# Patient Record
Sex: Male | Born: 1980 | Race: Black or African American | Hispanic: No | Marital: Single | Smoking: Current some day smoker
Health system: Southern US, Community
[De-identification: ages and names within clinical notes are randomized; demographics above are authoritative.]

## PROBLEM LIST (undated history)

## (undated) DIAGNOSIS — F329 Major depressive disorder, single episode, unspecified: Secondary | ICD-10-CM

## (undated) DIAGNOSIS — F319 Bipolar disorder, unspecified: Secondary | ICD-10-CM

## (undated) DIAGNOSIS — J45909 Unspecified asthma, uncomplicated: Secondary | ICD-10-CM

## (undated) DIAGNOSIS — F32A Depression, unspecified: Secondary | ICD-10-CM

## (undated) DIAGNOSIS — J449 Chronic obstructive pulmonary disease, unspecified: Secondary | ICD-10-CM

## (undated) DIAGNOSIS — F209 Schizophrenia, unspecified: Secondary | ICD-10-CM

## (undated) DIAGNOSIS — L709 Acne, unspecified: Secondary | ICD-10-CM

## (undated) HISTORY — DX: Acne, unspecified: L70.9

---

## 2013-07-14 ENCOUNTER — Encounter: Payer: Self-pay | Admitting: Internal Medicine

## 2013-07-14 ENCOUNTER — Ambulatory Visit (INDEPENDENT_AMBULATORY_CARE_PROVIDER_SITE_OTHER): Payer: No Typology Code available for payment source | Admitting: Internal Medicine

## 2013-07-14 VITALS — BP 113/70 | HR 59 | Ht 68.0 in | Wt 125.0 lb

## 2013-07-14 DIAGNOSIS — S43402A Unspecified sprain of left shoulder joint, initial encounter: Secondary | ICD-10-CM

## 2013-07-14 DIAGNOSIS — IMO0002 Reserved for concepts with insufficient information to code with codable children: Secondary | ICD-10-CM

## 2013-07-14 DIAGNOSIS — S6390XA Sprain of unspecified part of unspecified wrist and hand, initial encounter: Secondary | ICD-10-CM

## 2013-07-14 DIAGNOSIS — S6392XA Sprain of unspecified part of left wrist and hand, initial encounter: Secondary | ICD-10-CM

## 2013-07-14 DIAGNOSIS — S46909A Unspecified injury of unspecified muscle, fascia and tendon at shoulder and upper arm level, unspecified arm, initial encounter: Secondary | ICD-10-CM

## 2013-07-14 MED ORDER — IBUPROFEN 600 MG PO TABS
600.0000 mg | ORAL_TABLET | Freq: Three times a day (TID) | ORAL | Status: AC | PRN
Start: 2013-07-14 — End: 2013-07-24

## 2013-07-14 MED ORDER — CYCLOBENZAPRINE HCL 10 MG PO TABS
10.0000 mg | ORAL_TABLET | Freq: Every evening | ORAL | Status: AC | PRN
Start: 2013-07-14 — End: ?

## 2013-07-14 NOTE — Patient Instructions (Addendum)
Use heat locally  Do not raise left arm above shoulder level  Do not lift, pull or push  >35lbs   Take medications as directed.  Follow up in 10-14 days.         Sprain, Hand  A sprain is a stretching or tearing of the ligaments that hold a joint together. There are no broken bones. Sprains take from three to six weeks to heal.  A sprained hand may be treated with a splint or elastic wrap for support.  Home care  The following guidelines will help you care for your injury at home:  1) Keep your ARM elevated to reduce pain and swelling. This is most important during the first 48 hours.  2) Apply an ice pack (ice cubes in a plastic bag, wrapped in a towel) over the injured area for 20 minutes every 1-2 hours the first day. You should continue with ice packs 3-4 times a day for the next two days. Continue the use of ice packs for relief of pain and swelling as needed.  3) You may use acetaminophen or ibuprofen to control pain, unless another pain medicine was prescribed. If you have chronic liver or kidney disease or ever had a stomach ulcer or GI bleeding, talk with your doctor before using these medicines.  4) If you were given a splint or elastic wrap, wear it until your pain improves.  Follow-up care  Follow up with your doctor as directed.  Any X-rays you had today don't show any broken bones, breaks, or fractures. Sometimes fractures don't show up on the first X-ray. Bruises and sprains can sometimes hurt as much as a fracture. These injuries can take time to heal completely. If your symptoms don't improve or they get worse, talk with your doctor. You may need a repeat X-ray.  When to seek medical care  Get prompt medical attention if any of the following occur:   Pain or swelling increases   Fingers or hand becomes cold, blue, numb, or tingly   [address], [address], [place], [place] [address]. All rights reserved. This information is not intended as a substitute for professional medical  care. Always follow your healthcare professional's instructions.      Shoulder Sprain  A sprain is a stretching or tearing of the ligaments that hold a joint together. A sprain may take up to six weeks to fully heal, depending on how severe it is. Moderate to severe shoulder sprains are treated with a sling or shoulder immobilizer. Minor sprains can be treated without any special support.  Home care  The following guidelines will help you care for your injury at home:   If a sling was provided, leave it in place for the time advised by your doctor. If you are unsure how long to wear it, ask for advice. If the sling becomes loose, adjust it so that your forearm is level with the ground and the shoulder feels well supported.   Apply an ice pack (ice cubes in a plastic bag, wrapped in a thin towel) over the injured area for 20 minutes every 1-2 hours the first day. Continue with ice packs 3-4 times a day for the next two days, then as needed for the relief of pain and swelling.   You may use acetaminophen or ibuprofen to control pain, unless another pain medicine was prescribed.If you have chronic liver or kidney disease or ever had a stomach ulcer or GI bleeding, talk with your  doctor before using these medicines.   Shoulder joints become stiff if left in a sling for too long. Range of motion exercises should usually be started within the first ten days after injury. Consult your doctor on what type of exercises to do and how soon to start.  Follow-up care  Follow up with your doctor as directed.  Any X-rays you had today don't show any broken bones, breaks, or fractures. Sometimes fractures don't show up on the first X-ray. Bruises and sprains can sometimes hurt as much as a fracture. These injuries can take time to heal completely. If your symptoms don't improve or they get worse, talk with your doctor. You may need a repeat X-ray.  When to seek medical care  Get prompt medical attention if any of the following  occur:   Increasing shoulder pain or arm swelling   Fingers become cold, blue, numb, or tingly   Large amount of bruising of the shoulder or upper arm   [address], [address] [place], [place] [address]. All rights reserved. This information is not intended as a substitute for professional medical care. Always follow your healthcare professional's instructions.

## 2013-07-14 NOTE — Progress Notes (Signed)
Subjective:       Patient ID: Adam Deleon is a 33 y.o. male.  Chief Complaint   Patient presents with   . Shoulder Pain     Patient reports onset of left shoulder pain and "spasms" a couple hours after pushing a very heavy cart on 07/11/13.  He also noted onset of left hand pain/weakness.  He has not been to emergenct department or had x-rays.   . Hand Pain       Shoulder Pain   The pain is present in the left shoulder, left arm and left hand. This is a new problem. The current episode started in the past 7 days (On 07/11/13 while moving heavy food laden carts into his truck was holding the carts from rolling overstreched his left shoulder and hand. .Later developed spasms over the upper arm and shoulder and pain on left hand even with lifting a bowl.+). There has been no history of extremity trauma. The problem occurs intermittently. The problem has been unchanged. The quality of the pain is described as dull. Pain scale: pain is 8/10 on pushing left hand or lifting with left hand and on moving rleft hand above shoulder level. The pain is moderate. Associated symptoms include stiffness. Pertinent negatives include no joint locking or joint swelling. The treatment provided no relief.            Review of Systems   Constitutional: Negative for activity change and appetite change.   Respiratory: Negative for chest tightness.    Musculoskeletal: Positive for stiffness.        Pain on lifting with left hand. Pain on left shoulder and upper arm with overhead lifting of left arm .   Neurological: Negative for dizziness and weakness.           Objective:    Physical Exam   Constitutional: He appears well-developed and well-nourished. No distress.   HENT:   Head: Normocephalic and atraumatic.   Neck: Normal range of motion. Neck supple.   Musculoskeletal:        Left shoulder and upper arm- no swelling or deformity or tenderness with decreased ROM unable to circumduct, miidl tenderness over left deltoid on pressuret. Left  hand - no swelling or deformity but has pain over left 5 th metacarpal on pressure. Neurovascular intact. Grip strength normal.           Assessment:       Left shoulder and upper arm sprain.  Left hand sprain.      Plan:           Use heat locally  Do not raise left arm above shoulder level  Do not lift, pull or push  >35lbs   Take medications as directed.  Follow up in 10-14 days.

## 2013-07-26 VITALS — BP 111/56 | HR 68

## 2013-07-26 DIAGNOSIS — M25512 Pain in left shoulder: Secondary | ICD-10-CM

## 2013-07-26 NOTE — Progress Notes (Signed)
Subjective:       Patient ID: Adam Deleon is a 33 y.o. male.    HPI    Patient stated that his Left shoulder strain has not improved.  He still feels pain when raising his left arm over his shoulder.  Patient stated that he did not take his medications because he believes that they do not do anything other than numbing the pain.        Review of Systems   Constitutional: Negative.    HENT: Negative.    Eyes: Negative.    Respiratory: Negative.    Cardiovascular: Negative.    Gastrointestinal: Negative.    Genitourinary: Negative.    Musculoskeletal:        Left shoulder pain as per HPI   Neurological: Negative.    Hematological: Negative.    Psychiatric/Behavioral: Negative.            Objective:    Physical Exam   Constitutional: He is oriented to person, place, and time. He appears well-developed and well-nourished. No distress.   HENT:   Head: Normocephalic and atraumatic.   Eyes: EOM are normal. Pupils are equal, round, and reactive to light.   Neck: Normal range of motion. Neck supple.   Cardiovascular: Normal rate, regular rhythm and normal heart sounds.    Pulmonary/Chest: Effort normal and breath sounds normal.   Abdominal: Soft. Bowel sounds are normal.   Musculoskeletal:        Left shoulder flexion limited to 130 degree;abduction limited to 120 degree;internal and external rotation limited to 70 degree.    Right shoulder has full flexion and abduction to 180 degree; full internal and external rotation to 90 degree.  Normal cervical, thoracic, lumbar spine range of motion. Normal lower extremities range of motions.    Neurological: He is alert and oriented to person, place, and time.   Skin: Skin is warm and dry. He is not diaphoretic.   Psychiatric: He has a normal mood and affect. His behavior is normal.           Assessment:       Left shoulder strain not improving    Plan:       1. Refer to physical therapy  2. Refer to orthopedist

## 2013-09-24 ENCOUNTER — Ambulatory Visit (INDEPENDENT_AMBULATORY_CARE_PROVIDER_SITE_OTHER): Payer: No Typology Code available for payment source | Admitting: Internal Medicine

## 2013-09-24 VITALS — BP 107/62 | HR 67 | Temp 96.6°F | Resp 14 | Wt 130.0 lb

## 2013-09-24 DIAGNOSIS — Z5189 Encounter for other specified aftercare: Secondary | ICD-10-CM

## 2013-09-24 DIAGNOSIS — S43402D Unspecified sprain of left shoulder joint, subsequent encounter: Secondary | ICD-10-CM

## 2013-09-24 DIAGNOSIS — S46909A Unspecified injury of unspecified muscle, fascia and tendon at shoulder and upper arm level, unspecified arm, initial encounter: Secondary | ICD-10-CM

## 2013-09-24 NOTE — Progress Notes (Signed)
Subjective:       Patient ID: Adam Deleon is a 33 y.o. male.  Chief Complaint   Patient presents with   . Follow-up     F/U left shoulder and hand injury.  Pain to left hand 0/10, occationaly left shoulder feels like it is going to lock up, buy notes improvement       HPI Comments: Pt here for follow up of left shoulder sprain completed physical therapy never saw the orthopedist . He states he is much better occasionally feels stiff in left shoulder. PT helped a lot .Feels he is ready to go back to regular work.          Review of Systems   Constitutional: Negative.    Respiratory: Negative.    Cardiovascular: Negative.    Musculoskeletal: Negative.            Objective:    Physical Exam   Constitutional: He appears well-developed and well-nourished. No distress.   Musculoskeletal:        Left shoulder- no swelling or deformity or tenderness with FROM. Neurovascular- intact.   Skin: He is not diaphoretic.           Assessment:        Resolved left shoulder sprain.      Plan:         Go back to regular work.

## 2013-10-11 NOTE — Progress Notes (Signed)
This encounter was created in error - please disregard.

## 2014-02-15 ENCOUNTER — Ambulatory Visit (HOSPITAL_COMMUNITY)
Admission: RE | Admit: 2014-02-15 | Discharge: 2014-02-15 | Disposition: A | Discharge status: Home / Self Care | Payer: Self-pay | Attending: Psychiatry | Admitting: Psychiatry

## 2014-02-15 ENCOUNTER — Encounter (HOSPITAL_COMMUNITY): Payer: Self-pay | Admitting: Emergency Medicine
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
## 2014-02-21 ENCOUNTER — Encounter (HOSPITAL_COMMUNITY): Payer: Self-pay | Admitting: Emergency Medicine

## 2014-02-21 ENCOUNTER — Emergency Department (HOSPITAL_COMMUNITY)
Admission: EM | Admit: 2014-02-21 | Discharge: 2014-02-21 | Discharge status: Against Medical Advice | Payer: Medicaid Other | Attending: Emergency Medicine | Admitting: Emergency Medicine

## 2014-02-21 DIAGNOSIS — F17200 Nicotine dependence, unspecified, uncomplicated: Secondary | ICD-10-CM | POA: Diagnosis not present

## 2014-02-21 DIAGNOSIS — F1010 Alcohol abuse, uncomplicated: Secondary | ICD-10-CM | POA: Diagnosis present

## 2014-02-21 HISTORY — DX: Major depressive disorder, single episode, unspecified: F32.9

## 2014-02-21 HISTORY — DX: Bipolar disorder, unspecified: F31.9

## 2014-02-21 HISTORY — DX: Depression, unspecified: F32.A

## 2014-02-21 HISTORY — DX: Schizophrenia, unspecified: F20.9
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
## 2014-02-21 NOTE — ED Notes (Signed)
Pt told registration that he was leaving and did not want to stay to be seen. Registration made this RN aware.

## 2014-07-14 ENCOUNTER — Emergency Department (HOSPITAL_COMMUNITY)
Admission: EM | Admit: 2014-07-14 | Discharge: 2014-07-15 | Disposition: A | Discharge status: Other Acute Inpatient Hospital | Payer: Medicaid Other | Attending: Emergency Medicine | Admitting: Emergency Medicine

## 2014-07-14 ENCOUNTER — Encounter (HOSPITAL_COMMUNITY): Payer: Self-pay | Admitting: Adult Health

## 2014-07-14 DIAGNOSIS — R45850 Homicidal ideations: Secondary | ICD-10-CM

## 2014-07-14 DIAGNOSIS — R440 Auditory hallucinations: Secondary | ICD-10-CM

## 2014-07-14 DIAGNOSIS — R45851 Suicidal ideations: Secondary | ICD-10-CM | POA: Diagnosis present

## 2014-07-14 DIAGNOSIS — Z720 Tobacco use: Secondary | ICD-10-CM | POA: Diagnosis not present

## 2014-07-14 LAB — SALICYLATE LEVEL

## 2014-07-14 LAB — COMPREHENSIVE METABOLIC PANEL
ALT: 20 U/L (ref 0–53)
ANION GAP: 8 (ref 5–15)
AST: 24 U/L (ref 0–37)
Albumin: 4.1 g/dL (ref 3.5–5.2)
Alkaline Phosphatase: 74 U/L (ref 39–117)
BILIRUBIN TOTAL: 0.4 mg/dL (ref 0.3–1.2)
BUN: 18 mg/dL (ref 6–23)
CHLORIDE: 103 mmol/L (ref 96–112)
CO2: 26 mmol/L (ref 19–32)
CREATININE: 0.83 mg/dL (ref 0.50–1.35)
Calcium: 9.1 mg/dL (ref 8.4–10.5)
GFR calc Af Amer: >90 mL/min (ref >90)
Glucose, Bld: 102 mg/dL — ABNORMAL HIGH (ref 70–99)
Potassium: 4 mmol/L (ref 3.5–5.1)
Sodium: 137 mmol/L (ref 135–145)
Total Protein: 6.7 g/dL (ref 6.0–8.3)

## 2014-07-14 LAB — RAPID URINE DRUG SCREEN, HOSP PERFORMED
Amphetamines: NOT DETECTED
Barbiturates: NOT DETECTED
Benzodiazepines: NOT DETECTED
Cocaine: NOT DETECTED
OPIATES: NOT DETECTED
TETRAHYDROCANNABINOL: POSITIVE — AB

## 2014-07-14 LAB — CBC
HEMATOCRIT: 40 % (ref 39.0–52.0)
HEMOGLOBIN: 13.3 g/dL (ref 13.0–17.0)
MCH: 26.6 pg (ref 26.0–34.0)
MCHC: 33.3 g/dL (ref 30.0–36.0)
MCV: 80 fL (ref 78.0–100.0)
Platelets: 269 10*3/uL (ref 150–400)
RBC: 5 MIL/uL (ref 4.22–5.81)
RDW: 14.5 % (ref 11.5–15.5)
WBC: 7 10*3/uL (ref 4.0–10.5)

## 2014-07-14 LAB — ETHANOL: Alcohol, Ethyl (B): <5 mg/dL (ref 0–9)

## 2014-07-14 LAB — ACETAMINOPHEN LEVEL: Acetaminophen (Tylenol), Serum: <10 ug/mL — ABNORMAL LOW (ref 10–30)

## 2014-07-14 MED ORDER — ACETAMINOPHEN 325 MG PO TABS: 650.0000 mg | ORAL_TABLET | ORAL | Status: DC | PRN

## 2014-07-14 MED ORDER — CLONAZEPAM 0.5 MG PO TABS: 1.0000 mg | ORAL_TABLET | Freq: Two times a day (BID) | ORAL | Status: DC | PRN

## 2014-07-14 MED ORDER — HALOPERIDOL 5 MG PO TABS
5.0000 mg | ORAL_TABLET | Freq: Two times a day (BID) | ORAL | Status: DC
  Administered 2014-07-14 – 2014-07-15 (×2): 5 mg via ORAL
  Filled 2014-07-14 (×2): qty 1

## 2014-07-14 MED ORDER — ALUM & MAG HYDROXIDE-SIMETH 200-200-20 MG/5ML PO SUSP: 30.0000 mL | ORAL | Status: DC | PRN

## 2014-07-14 MED ORDER — IBUPROFEN 400 MG PO TABS: 600.0000 mg | ORAL_TABLET | Freq: Three times a day (TID) | ORAL | Status: DC | PRN

## 2014-07-14 MED ORDER — ZOLPIDEM TARTRATE 5 MG PO TABS: 5.0000 mg | ORAL_TABLET | Freq: Every evening | ORAL | Status: DC | PRN

## 2014-07-14 MED ORDER — ONDANSETRON HCL 4 MG PO TABS: 4.0000 mg | ORAL_TABLET | Freq: Three times a day (TID) | ORAL | Status: DC | PRN

## 2014-07-14 MED ORDER — NICOTINE 21 MG/24HR TD PT24
21.0000 mg | MEDICATED_PATCH | Freq: Every day | TRANSDERMAL | Status: DC
  Administered 2014-07-14 – 2014-07-15 (×2): 21 mg via TRANSDERMAL
  Filled 2014-07-14 (×2): qty 1

## 2014-07-14 NOTE — ED Notes (Signed)
Security at the bedside with the pt now. Staffing notified about sitter need, [place] EMT at the bedside sitting now until sitter arrives.
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
## 2014-07-15 ENCOUNTER — Inpatient Hospital Stay (HOSPITAL_COMMUNITY)
Admission: AD | Admit: 2014-07-15 | Discharge: 2014-07-21 | DRG: 885 | Disposition: A | Discharge status: Home / Self Care | Payer: Medicaid Other | Source: Intra-hospital | Attending: Psychiatry | Admitting: Psychiatry

## 2014-07-15 DIAGNOSIS — Z23 Encounter for immunization: Secondary | ICD-10-CM | POA: Diagnosis not present

## 2014-07-15 DIAGNOSIS — F17200 Nicotine dependence, unspecified, uncomplicated: Secondary | ICD-10-CM | POA: Diagnosis present

## 2014-07-15 DIAGNOSIS — R45850 Homicidal ideations: Secondary | ICD-10-CM | POA: Diagnosis present

## 2014-07-15 DIAGNOSIS — F319 Bipolar disorder, unspecified: Secondary | ICD-10-CM | POA: Diagnosis present

## 2014-07-15 DIAGNOSIS — F203 Undifferentiated schizophrenia: Secondary | ICD-10-CM | POA: Insufficient documentation

## 2014-07-15 DIAGNOSIS — F209 Schizophrenia, unspecified: Secondary | ICD-10-CM | POA: Diagnosis present

## 2014-07-15 DIAGNOSIS — F259 Schizoaffective disorder, unspecified: Principal | ICD-10-CM | POA: Diagnosis present

## 2014-07-15 DIAGNOSIS — G4700 Insomnia, unspecified: Secondary | ICD-10-CM | POA: Diagnosis present

## 2014-07-15 DIAGNOSIS — F419 Anxiety disorder, unspecified: Secondary | ICD-10-CM | POA: Diagnosis present

## 2014-07-15 DIAGNOSIS — R45851 Suicidal ideations: Secondary | ICD-10-CM | POA: Diagnosis present

## 2014-07-15 MED ORDER — INFLUENZA VAC SPLIT QUAD 0.5 ML IM SUSY
0.5000 mL | PREFILLED_SYRINGE | INTRAMUSCULAR | Status: AC
  Administered 2014-07-16: 0.5 mL via INTRAMUSCULAR
  Filled 2014-07-15: qty 0.5

## 2014-07-15 MED ORDER — PNEUMOCOCCAL VAC POLYVALENT 25 MCG/0.5ML IJ INJ
0.5000 mL | INJECTION | INTRAMUSCULAR | Status: AC
  Administered 2014-07-16: 0.5 mL via INTRAMUSCULAR
  Filled 2014-07-15: qty 0.5

## 2014-07-15 NOTE — ED Notes (Signed)
Dr. Madilyn [facility]okees aware [facility].

## 2014-07-15 NOTE — ED Notes (Signed)
[facility] of need [facility].
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
## 2014-07-15 NOTE — ED Notes (Signed)
Pt belongings have been retrieved and are to go with [facility].

## 2014-07-15 NOTE — ED Notes (Addendum)
Pt [facility], states he will go voluntarily. Signed consent for treatment.

## 2014-07-16 DIAGNOSIS — F259 Schizoaffective disorder, unspecified: Secondary | ICD-10-CM

## 2014-07-16 DIAGNOSIS — R45851 Suicidal ideations: Secondary | ICD-10-CM

## 2014-07-16 MED ORDER — TRAZODONE HCL 50 MG PO TABS: 50.0000 mg | ORAL_TABLET | Freq: Every evening | ORAL | Status: DC | PRN

## 2014-07-16 MED ORDER — ACETAMINOPHEN 325 MG PO TABS: 650.0000 mg | ORAL_TABLET | Freq: Four times a day (QID) | ORAL | Status: DC | PRN

## 2014-07-16 MED ORDER — NICOTINE 21 MG/24HR TD PT24
21.0000 mg | MEDICATED_PATCH | Freq: Every day | TRANSDERMAL | Status: DC
  Administered 2014-07-16: 21 mg via TRANSDERMAL
  Filled 2014-07-16 (×8): qty 1

## 2014-07-16 MED ORDER — HALOPERIDOL 2 MG PO TABS
2.0000 mg | ORAL_TABLET | Freq: Two times a day (BID) | ORAL | Status: DC
  Administered 2014-07-16 – 2014-07-18 (×4): 2 mg via ORAL
  Filled 2014-07-16 (×8): qty 1

## 2014-07-16 MED ORDER — SERTRALINE HCL 50 MG PO TABS
50.0000 mg | ORAL_TABLET | Freq: Every day | ORAL | Status: DC
  Administered 2014-07-16 – 2014-07-20 (×5): 50 mg via ORAL
  Filled 2014-07-16 (×7): qty 1
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
## 2014-07-16 NOTE — Progress Notes (Signed)
Psychoeducational Group Note  Date:  07/16/2014 Time:  2218  Group Topic/Focus:  Wrap-Up Group:   The focus of this group is to help patients review their daily goal of treatment and discuss progress on daily workbooks.  Participation Level: Did Not Attend  Participation Quality:  Not Applicable  Affect:  Not Applicable  Cognitive:  Not Applicable  Insight:  Not Applicable  Engagement in Group: Not Applicable  Additional Comments:  The patient did not attend group this evening since he remained in bed.   Hazle CocaGOODMAN, Kikue Gerhart S 07/16/2014, 10:17 PM

## 2014-07-16 NOTE — Plan of Care (Signed)
Problem: Ineffective individual coping Goal: STG: Patient will remain free from self harm Outcome: Progressing Safety maintained on Q 15 minutes checks as per order without gestures or event of self harm to note at this time.

## 2014-07-16 NOTE — [facility] Group Notes (Signed)
[facility] Group Notes: (Clinical Social Work)   07/16/2014      Type of Therapy:  Group Therapy   Participation Level:  Did Not Attend - came to group but was itching and uncomfortable and asked permission to go see the nurse   Ambrose MantleMareida Grossman-Orr, LCSW 07/16/2014, 5:00 PM

## 2014-07-16 NOTE — Progress Notes (Signed)
Patient seen pacing the unit. Appear to be responding to internal stimuli as seen talking to self and laughing occasionally. Denies pain, SI, AH/VH. Patient made no new complaint. Due medications given as ordered. Safety measures at all times. Will continue to monitor patient for safety and stability.

## 2014-07-16 NOTE — [facility] Group Notes (Signed)
[facility] Group Notes:Coping skills and goals  Date:  07/16/2014  Time:  10:00am  Type of Therapy:  Nurse Education  Participation Level:  Did Not Attend  Participation Quality:  Inattentive  Affect:  Blunted  Cognitive:  Lacking  Insight:  None  Engagement in Group:  None  Modes of Intervention:  Discussion  Summary of Progress/Problems:  Ronald Martinez, Ronald Martinez 07/16/2014, 10:29 AM
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
## 2014-07-16 NOTE — Progress Notes (Signed)
D: Pt alert, observed pacing in hall at intervals during shift. Presents with flat affect and depressed mood. Appears disheveled with body odor. Visible in dayroom engaged in brief conversations with peers at intervals. Pleasant on approach. A: Q 15 minutes checks maintained as ordered without behavioral issues to note at present. All medications including Flu and Pneumonia vaccines administered as per physician's order.Baseline EKG done. Pt encouraged to attend to his ADL needs and supplies offered this shift but to no avail. Support and availability offered.  R: Pt did not attend scheduled groups this shift, despite multiple prompts. Cooperative with EKG and vaccines and was tolerated well. Denied adverse drug reactions when assessed. Contracts for safety while hospitalized. Denied HI, AVH and pain at time of assessment. Pt remains safe on/off unit.
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
## 2014-07-17 DIAGNOSIS — F259 Schizoaffective disorder, unspecified: Principal | ICD-10-CM

## 2014-07-17 DIAGNOSIS — R45850 Homicidal ideations: Secondary | ICD-10-CM

## 2014-07-17 MED ORDER — MIRTAZAPINE 7.5 MG PO TABS
7.5000 mg | ORAL_TABLET | Freq: Every day | ORAL | Status: DC
  Administered 2014-07-17 – 2014-07-20 (×4): 7.5 mg via ORAL
  Filled 2014-07-17 (×6): qty 1

## 2014-07-17 NOTE — Progress Notes (Signed)
Patient ID: Ronald ForesterKevin Martinez, male   DOB: 11/11/1980, [age] y.o.   MRN: [id number] D: Patient appears to be responding to internal stimuli.  He denies any SI/HI/AVH.  He presents with flat affect and sad mood.  He is pleasant and compliant with his medications.  Patient tends to isolate to room with minimal interactions with peers and staff.   A: Continue to monitor medication management and MD orders.  Safety checks completed every 15 minutes per protocol.  Meet 1:1 to discuss concerns and offer encouragement. R: Patient's behavior is appropriate to situation.
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
## 2014-07-17 NOTE — Progress Notes (Signed)
D Patient seen pacing the hall way laughing and talking to self. Mood is labile. Denies pain, SI, AH/VH. Remains isolative and minimal interaction. Patient remains cooperative and appropriate. A: Patient encouraged to continue and participate in his treatment plan. To verbalize needs to staff. Due medications given as ordered. Safety maintained at all times.  R: No behavioral issues noted.      Will continue to monitor patient.

## 2014-07-17 NOTE — [facility] Group Notes (Signed)
[facility] Group Notes:  (Clinical Social Work)  07/17/2014   11:15am-12:00pm  Summary of Progress/Problems:  The main focus of today's process group was to listen to a variety of genres of music and to identify that different types of music provoke different responses.  The patient then was able to identify personally what was soothing for them, as well as energizing.  Handouts were used to record feelings evoked, as well as how patient can personally use this knowledge in sleep habits, with depression, and with other symptoms.  The patient expressed understanding of concepts, as well as knowledge of how each type of music affected him/her and how this can be used at home as a wellness/recovery tool.  He engaged fully in group and had positive reactions to most of the music.  He frequently had to get to his feet to stretch his back as though in pain.  Type of Therapy:  Music Therapy   Participation Level:  Active  Participation Quality:  Attentive and Sharing  Affect:  Blunted  Cognitive:  Oriented  Insight:  Engaged  Engagement in Therapy:  Engaged  Modes of Intervention:   Activity, Exploration  Ambrose MantleMareida Grossman-Orr, LCSW 07/17/2014, 12:30pm

## 2014-07-17 NOTE — [facility] Group Notes (Signed)
[facility] Group Notes:  (Nursing/MHT/Case Management/Adjunct)  Date:  07/17/2014  Time:  0930 am  Type of Therapy:  Psychoeducational Skills  Participation Level:  Minimal  Participation Quality:  Attentive  Affect:  Flat  Cognitive:  Alert  Insight:  Lacking  Engagement in Group:  Lacking  Modes of Intervention:  Support  Summary of Progress/Problems: Gerardo's goal is to work on his depression.  He plans to attain his goal by taking his medications.    Cranford MonBeaudry, Kaleigh Spiegelman Evans 07/17/2014, 10:05 AM

## 2014-07-17 NOTE — Plan of Care (Signed)
Problem: Alteration in mood Goal: STG-Patient is able to discuss feelings and issues (Patient is able to discuss feelings and issues leading to depression)  Outcome: Progressing Patient has been able to express needs and concerns today.

## 2014-07-18 DIAGNOSIS — F209 Schizophrenia, unspecified: Secondary | ICD-10-CM | POA: Diagnosis present

## 2014-07-18 MED ORDER — ACETAMINOPHEN 325 MG PO TABS: 650.0000 mg | ORAL_TABLET | Freq: Four times a day (QID) | ORAL | Status: DC | PRN

## 2014-07-18 MED ORDER — HALOPERIDOL 5 MG PO TABS
5.0000 mg | ORAL_TABLET | Freq: Two times a day (BID) | ORAL | Status: DC
  Administered 2014-07-18 – 2014-07-19 (×2): 5 mg via ORAL
  Filled 2014-07-18 (×5): qty 1

## 2014-07-18 MED ORDER — MAGNESIUM HYDROXIDE 400 MG/5ML PO SUSP: 30.0000 mL | Freq: Every day | ORAL | Status: DC | PRN

## 2014-07-18 MED ORDER — GABAPENTIN 100 MG PO CAPS
100.0000 mg | ORAL_CAPSULE | ORAL | Status: DC
  Administered 2014-07-18 – 2014-07-19 (×3): 100 mg via ORAL
  Filled 2014-07-18 (×6): qty 1

## 2014-07-18 MED ORDER — BENZTROPINE MESYLATE 0.5 MG PO TABS
0.5000 mg | ORAL_TABLET | Freq: Two times a day (BID) | ORAL | Status: DC
  Administered 2014-07-19: 0.5 mg via ORAL
  Filled 2014-07-18 (×4): qty 1

## 2014-07-18 MED ORDER — ALUM & MAG HYDROXIDE-SIMETH 200-200-20 MG/5ML PO SUSP: 30.0000 mL | ORAL | Status: DC | PRN

## 2014-07-18 NOTE — Progress Notes (Signed)
Patient ID: Ronald ForesterKevin Martinez, male   DOB: 07/27/1980, 34 y.o.   MRN: [id number]  DAR: Pt. Denies SI/HI and A/V Hallucinations to this writer however patient is seen in the hallway speaking to himself. Patient does not report any pain or discomfort at this time. Patient was asked to fill out daily inventory sheet but has not done so thus far. Support and encouragement provided to the patient. Scheduled medications administered to patient per physician's orders. Patient is cooperative but is minimal in interaction. Patient is seen mostly pacing the halls and is seen responding to internal stimuli (speaking with self in hallway). Patient is seen is going to cafeteria for meals and is attending some groups. Q15 minute checks are maintained for safety.

## 2014-07-18 NOTE — Progress Notes (Signed)
Did not attend group 

## 2014-07-18 NOTE — Progress Notes (Signed)
D:Patient in bed on approach.  Patient states he has been relaxing.  Patient states his goal for today was to relax and work on his treament.  Patient states he is working on his goal.  Patient was sleeping when Clinical research associatewriter went into his room and patient slept through group.  Patient did get up to eat snack tonight. Patient denies SI/HI and denies AVH but patient appears to be responding to internal stimuli. A: Staff to monitor Q 15 mins for safety.  Encouragement and support offered.  Scheduled medications administered per orders. R: Patient remains safe on the unit.  Patient did not attend group tonight.  Patient not visible on the unit only for medications and snack.  Patient taking administered mediations.
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
## 2014-07-18 NOTE — Plan of Care (Signed)
Problem: Diagnosis: Increased Risk For Suicide Attempt Goal: STG-Patient Will Comply With Medication Regime Outcome: Progressing Patient taking medication as prescribed at this time.

## 2014-07-18 NOTE — [facility] Group Notes (Signed)
[facility] LCSW Group Therapy  07/18/2014 1:15 pm  Type of Therapy: Process Group Therapy  Participation Level:  Active  Participation Quality:  Appropriate  Affect:  Flat  Cognitive:  Oriented  Insight:  Improving  Engagement in Group:  Limited  Engagement in Therapy:  Limited  Modes of Intervention:  Activity, Clarification, Education, Problem-solving and Support  Summary of Progress/Problems: Today's group addressed the issue of overcoming obstacles.  Patients were asked to identify their biggest obstacle post d/c that stands in the way of their on-going success, and then problem solve as to how to manage this.Stated his obstacle is getting over paranoia, and he is is over it because he was started on new medication since admission on Sat.  Limited insight, limited interaction.  Ida Rogueorth, Gurfateh Mcclain B 07/18/2014   2:49 PM

## 2014-07-18 NOTE — [facility] Group Notes (Signed)
[facility] Note   07/18/2014 2:44 PM  Participation Quality:  Minimal  Mood/Affect:  Depressed  Depression Rating:    Anxiety Rating:    Thoughts of Suicide:  No Will you contract for safety?   NA  Current AVH:  No  Plan for Discharge/Comments:  Ronald Martinez states that it has been 3 months since he last took meds, and he endorses paranoia.  [facility]  Has been staying with friends, but would like to got [facility] an [facility] [place]  By the end of group, he decided that ALF is in his best interest, and he signed a release to commence the [facility] process.  Transportation Means:   Supports:  Ronald Martinez, Ronald Martinez

## 2014-07-19 LAB — TSH: TSH: 0.893 u[IU]/mL (ref 0.350–4.500)

## 2014-07-19 LAB — LIPID PANEL
Cholesterol: 169 mg/dL (ref 0–200)
HDL: 60 mg/dL (ref >39)
LDL CALC: 101 mg/dL — AB (ref 0–99)
Total CHOL/HDL Ratio: 2.8 RATIO
Triglycerides: 40 mg/dL (ref <150)
VLDL: 8 mg/dL (ref 0–40)

## 2014-07-19 MED ORDER — HALOPERIDOL 5 MG PO TABS
10.0000 mg | ORAL_TABLET | Freq: Every day | ORAL | Status: DC
  Administered 2014-07-19 – 2014-07-20 (×2): 10 mg via ORAL
  Filled 2014-07-19 (×3): qty 2

## 2014-07-19 MED ORDER — BENZTROPINE MESYLATE 0.5 MG PO TABS
0.5000 mg | ORAL_TABLET | ORAL | Status: DC
  Administered 2014-07-19 – 2014-07-21 (×4): 0.5 mg via ORAL
  Filled 2014-07-19 (×6): qty 1

## 2014-07-19 MED ORDER — DOCUSATE SODIUM 100 MG PO CAPS
100.0000 mg | ORAL_CAPSULE | Freq: Two times a day (BID) | ORAL | Status: DC
  Administered 2014-07-19 – 2014-07-21 (×5): 100 mg via ORAL
  Filled 2014-07-19 (×8): qty 1

## 2014-07-19 MED ORDER — HALOPERIDOL 5 MG PO TABS
5.0000 mg | ORAL_TABLET | Freq: Every day | ORAL | Status: DC
  Administered 2014-07-20 – 2014-07-21 (×2): 5 mg via ORAL
  Filled 2014-07-19 (×3): qty 1

## 2014-07-19 MED ORDER — GABAPENTIN 100 MG PO CAPS
200.0000 mg | ORAL_CAPSULE | ORAL | Status: DC
  Administered 2014-07-19 – 2014-07-20 (×3): 200 mg via ORAL
  Filled 2014-07-19 (×6): qty 2

## 2014-07-19 NOTE — Progress Notes (Signed)
Patient ID: Ronald ForesterKevin Schwoerer, male   DOB: 07/10/1980, 34 y.o.   MRN: [id number]  DAR: Pt. Denies SI/HI and A/V Hallucinations to writer however patient is seen in the hallway speaking to himself. Patient does not report any pain or discomfort at this time. Support and encouragement provided to the patient to take his medications as prescribed. After patient spoke with MD patient was willing to take his medications. Patient has required no PRN's at this time. Patient is receptive and cooperative with Clinical research associatewriter but minimal. Patient is seen at times in the milieu and is attending some groups. Patient continues to act bizarre at times and presents with thought blocking as well. Q15 minute checks are maintained for safety.
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
## 2014-07-19 NOTE — Plan of Care (Signed)
Problem: Diagnosis: Increased Risk For Suicide Attempt Goal: LTG-Patient Will Show Positive Response to Medication LTG (by discharge) : Patient will show positive response to medication and will participate in the development of the discharge plan.  Outcome: Not Progressing Patient is taking his medications as prescribed but patient continues to respond to internal stimuli.

## 2014-07-19 NOTE — Progress Notes (Signed)
Adult Psychoeducational Group Note  Date:  07/19/2014 Time:  1:12 AM  Group Topic/Focus:  Wrap-Up Group:   The focus of this group is to help patients review their daily goal of treatment and discuss progress on daily workbooks.  Participation Level:  Did Not Attend  Additional Comments:  Pt was in bed sleeping at the time of group.   Caswell CorwinOwen, Asah Lamay C 07/19/2014, 1:12 AM

## 2014-07-19 NOTE — Tx Team (Addendum)
Initial Interdisciplinary Treatment Plan   PATIENT STRESSORS: Educational concerns Financial difficulties Medication change or noncompliance   PATIENT STRENGTHS: Active sense of humor Communication skills   PROBLEM LIST: Problem List/Patient Goals Date to be addressed Date deferred Reason deferred Estimated date of resolution  "I just want to get better" 07/19/2014     "I want my medictions to work" 07/19/2014     Risk for suicide 07/19/2014                                          DISCHARGE CRITERIA:  Ability to meet basic life and health needs Improved stabilization in mood, thinking, and/or behavior Safe-care adequate arrangements made  PRELIMINARY DISCHARGE PLAN: Attend aftercare/continuing care group Outpatient therapy Return to previous living arrangement  PATIENT/FAMIILY INVOLVEMENT: This treatment plan has been presented to and reviewed with the patient, Ronald Martinez.  The patient and family have been given the opportunity to ask questions and make suggestions.  Angeline SlimHill, Ashley M 07/19/2014, 4:45 AM

## 2014-07-19 NOTE — [facility] Group Notes (Signed)
[facility] Group Notes:  (Nursing/MHT/Case Management/Adjunct)  Date:  07/19/2014  Time:  11:07 AM  Type of Therapy:  Nurse Education  Participation Level:  None  Participation Quality:  Inattentive  Affect:  Blunted  Cognitive:  UTA  Insight:  Limited  Engagement in Group:  None  Modes of Intervention:  Discussion and Education  Summary of Progress/Problems: The purpose of this group is to discuss the topic of the day which is Recovery. Patient attended group but was late and did not participate. Patient was seen staring mostly at the floor and rocking back and forth in his chair. Patient had no questions during group.  Braelyn Jenson E 07/19/2014, 11:07 AM

## 2014-07-19 NOTE — [facility] Group Notes (Signed)
[facility] Group Notes:  (Counselor/Nursing/MHT/Case Management/Adjunct)  07/19/2014 1:15PM  Type of Therapy:  Group Therapy  Participation Level:  Active  Participation Quality:  Appropriate  Affect:  Flat  Cognitive:  Oriented  Insight:  Improving  Engagement in Group:  Limited  Engagement in Therapy:  Limited  Modes of Intervention:  Discussion, Exploration and Socialization  Summary of Progress/Problems: The topic for group was balance in life.  Pt participated in the discussion about when their life was in balance and out of balance and how this feels.  Pt discussed ways to get back in balance and short term goals they can work on to get where they want to be. Stayed for the entire group.  Struggled somewhat when asked questions, but able to focus enough to give an answer that made sense in the context.  Talked about community support of a neighborhood, and of family as well.   Ronald Martinez, Ronald Martinez 07/19/2014 12:24 PM

## 2014-07-20 LAB — HEMOGLOBIN A1C
Hgb A1c MFr Bld: 5.8 % — ABNORMAL HIGH (ref 4.8–5.6)
Mean Plasma Glucose: 120 mg/dL

## 2014-07-20 MED ORDER — SERTRALINE HCL 25 MG PO TABS
75.0000 mg | ORAL_TABLET | Freq: Every day | ORAL | Status: DC
  Administered 2014-07-21: 75 mg via ORAL
  Filled 2014-07-20 (×2): qty 3

## 2014-07-20 MED ORDER — GABAPENTIN 300 MG PO CAPS
300.0000 mg | ORAL_CAPSULE | ORAL | Status: DC
  Administered 2014-07-20 – 2014-07-21 (×3): 300 mg via ORAL
  Filled 2014-07-20 (×6): qty 1

## 2014-07-20 NOTE — Progress Notes (Signed)
The focus of this group is to help patients review their daily goal of treatment and discuss progress on daily workbooks. Pt reported that his goal for the day was to make progress toward discharge. Pt reported that he accomplished this goal, as he is set to discharge tomorrow (per his own verbalization). Pt shared that he has learned coping skills [facility] to help him avert outbursts, including communicating respectfully with others when he is angry and walking away from situations before an outburst occurs. Toward the end of the pt's turn to share, pt became distracted and started talking to himself. Pt appeared to be responding to internal stimuli, and he appeared frustrated,
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
## 2014-07-20 NOTE — Plan of Care (Signed)
Problem: Diagnosis: Increased Risk For Suicide Attempt Goal: STG-Patient Will Attend All Groups On The Unit Outcome: Progressing Patient is attending some groups thus far

## 2014-07-20 NOTE — [facility] Group Notes (Signed)
[facility] LCSW Group Therapy  07/20/2014 1:44 PM  Type of Therapy:  Group Therapy  Participation Level:  Did Not Attend  Summary of Progress/Problems:[employer] [facility]) speaker came to talk about his personal journey with substance abuse and mental illness. Group members were challenged to process ways by which to relate to the speaker. [facility] speaker provided handouts and educational information pertaining to groups and services offered by [facility].   Hyatt,Candace 07/20/2014, 1:44 PM

## 2014-07-20 NOTE — Progress Notes (Signed)
Patient ID: Ronald ForesterKevin Vossler, male   DOB: 10/26/1980, 34 y.o.   MRN: [id number]  D: Pt. Denies SI/HI and A/V Hallucinations to this writer but continues to speak to himself intermittently responding to internal stimuli. Patient is mostly seen pacing the halls or in the dayroom but at times can be seen in his bed. Patient does not report any pain or discomfort at this time. Patient denies any concerns and has no questions for staff at this time.  A: Support and encouragement provided to the patient. Scheduled medications administered to patient per physician's orders. Writer spoke to patient about Colace to reinforce what night RN reports speaking to patient about. Patient verbalized understanding and was not irritable this time.   R: Patient is receptive and cooperative but minimal with Clinical research associatewriter. Patient is attending some groups and is able to speak to staff about questions or concerns. Q15 minute checks are maintained for safety.

## 2014-07-20 NOTE — Progress Notes (Signed)
D: Pt is currently denying any SI/HI/AVH. Pt presents flat in affect and labile in mood. Pt observed pacing the halls when he was not in the dayroom. Pt became agitated when this writer did not administer his Colace with his scheduled cogentin. Writer reviewed order with pt and informed him that he received his two scheduled doses at 1033 and 1647. Pt's agitation decreased with the given information. Pt voiced the concern that he has been "very cold" lately. " I believe that I'm anemic". Pt voiced the request for his Doctor to place him on an iron and calcium supplement. Pt was encouraged to voice his concerns to his psychiatrist. However, Clinical research associatewriter informed pt that his recent labs did not warrant the provider to order the above supplements.  A: Writer administered scheduled medications to pt, per MD orders. Continued support and availability as needed was extended to this pt. Staff continue to monitor pt with q2615min checks.  R: No adverse drug reactions noted. Pt receptive to treatment. Pt remains safe at this time.

## 2014-07-20 NOTE — Tx Team (Signed)
  Interdisciplinary Treatment Plan Update   Date Reviewed:  07/20/2014  Time Reviewed:  3:37 PM  Progress in Treatment:   Attending groups: Yes Participating in groups: Yes Taking medication as prescribed: Yes  Tolerating medication: Yes Family/Significant other contact made: No Patient understands diagnosis: Yes  Discussing patient identified problems/goals with staff: Yes  See initial care plan Medical problems stabilized or resolved: Yes Denies suicidal/homicidal ideation: Yes  In tx team Patient has not harmed self or others: Yes  For review of initial/current patient goals, please see plan of care.  Estimated Length of Stay:  Likely d/c tomorrow  Reason for Continuation of Hospitalization:   New Problems/Goals identified:  N/A  Discharge Plan or Barriers:   return home, follow up outpt  Additional Comments:  Attendees:  Signature: Ivin BootySarama Eappen, MD 07/20/2014 3:37 PM   Signature: Richelle Itood Janaysha Depaulo, LCSW 07/20/2014 3:37 PM  Signature:  07/20/2014 3:37 PM  Signature: Marzetta Boardhrista Dopson, RN 07/20/2014 3:37 PM  Signature:  07/20/2014 3:37 PM  Signature:  07/20/2014 3:37 PM  Signature:   07/20/2014 3:37 PM  Signature:    Signature:    Signature:    Signature:    Signature:    Signature:      Scribe for Treatment Team:   Richelle Itood Ailyn Gladd, LCSW  07/20/2014 3:37 PM

## 2014-07-20 NOTE — Progress Notes (Signed)
D: Pt was more engaged in conversation with wrClinical research associateiter this evening. Pt actively participated in group. Pt was observed pacing the halls calmly. Pt noted to be talking to himself during group. Pt is currently denying any SI/HI/AVH. Pt denies any constipation with the use of colace while on cogentin. Pt denies any concerns other than being placed on an iron supplement. " I feel cold". Writer reviewed pt's CBC as a possible indicator of needing an iron supplement. Writer displayed pt's results which were WDL. Pt was encouraged to speak with his psychiatrist about any further concerns with his "iron" levels.   A: Writer administered scheduled medications to pt, per MD orders. Continued support and availability as needed was extended to this pt. Staff continue to monitor pt with q6515min checks.  R: No adverse drug reactions noted. Pt receptive to treatment. Pt remains safe at this time.

## 2014-07-20 NOTE — [facility] Suicide Risk Assessment (Signed)
[facility] INPATIENT:  Family/Significant Other Suicide Prevention Education  Suicide Prevention Education:  Patient Refusal for Family/Significant Other Suicide Prevention Education: The patient Ronald Martinez has refused to provide written consent for family/significant other to be provided Family/Significant Other Suicide Prevention Education during admission and/or prior to discharge.  Physician notified.  Hyatt,Candace 07/20/2014, 2:32 PM

## 2014-07-20 NOTE — [facility] Group Notes (Signed)
[facility]   07/20/2014 9:55 AM  Participation Quality:  Active   Mood/Affect:  Appropriate  Depression Rating:  0  Anxiety Rating: 0   Thoughts of Suicide:  No Will you contract for safety?   NA  Current AVH:  No  Plan for Discharge/Comments:  Pt has changed his mind about wanting placement. He has contacted a friend who agreed to allow him to stay with them. Pt plans to follow up with [facility]. Possible discharge tomorrow.   Transportation Means: Bus   Supports: Friend  Hyatt,Candace

## 2014-07-21 DIAGNOSIS — F203 Undifferentiated schizophrenia: Secondary | ICD-10-CM | POA: Insufficient documentation

## 2014-07-21 MED ORDER — HALOPERIDOL 5 MG PO TABS: 5.0000 mg | ORAL_TABLET | Freq: Every day | ORAL | Status: DC

## 2014-07-21 MED ORDER — BENZTROPINE MESYLATE 0.5 MG PO TABS: 0.5000 mg | ORAL_TABLET | ORAL | Status: DC

## 2014-07-21 MED ORDER — GABAPENTIN 300 MG PO CAPS: 300.0000 mg | ORAL_CAPSULE | ORAL | Status: DC

## 2014-07-21 MED ORDER — SERTRALINE HCL 25 MG PO TABS: 75.0000 mg | ORAL_TABLET | Freq: Every day | ORAL | Status: DC

## 2014-07-21 MED ORDER — NICOTINE 21 MG/24HR TD PT24: 21.0000 mg | MEDICATED_PATCH | Freq: Every day | TRANSDERMAL | Status: DC

## 2014-07-21 MED ORDER — MIRTAZAPINE 7.5 MG PO TABS: 7.5000 mg | ORAL_TABLET | Freq: Every day | ORAL | Status: DC

## 2014-07-21 NOTE — Discharge Summary (Signed)
Physician Discharge Summary Note  Patient:  Ronald Martinez is an 34 y.o., male MRN:  [id number] DOB:  02/17/1981 Patient phone:  [phone] (home)  Patient address:   [address] [place] [id number],  Total Time spent with patient: 30 minutes  Date of Admission:  07/15/2014 Date of Discharge: 07/21/2014  Reason for Admission:  Psychosis  Principal Problem: Schizophrenia Discharge Diagnoses: Patient Active Problem List   Diagnosis Date Noted  . Undifferentiated schizophrenia [F20.3]   . Schizophrenia [F20.9] 07/18/2014   Musculoskeletal: Strength & Muscle Tone: within normal limits Gait & Station: normal Patient leans: N/A  Psychiatric Specialty Exam:  SEE SRA Physical Exam  Vitals reviewed. Psychiatric: He has a normal mood and affect.    Review of Systems  Constitutional: Negative.   HENT: Negative.   Eyes: Negative.   Respiratory: Negative.   Cardiovascular: Negative.   Gastrointestinal: Negative.   Genitourinary: Negative.   Musculoskeletal: Negative.   Skin: Negative.   Neurological: Negative.   Endo/Heme/Allergies: Negative.   Psychiatric/Behavioral: The patient is nervous/anxious.     Blood pressure 135/63, pulse 92, temperature 98.6 F (37 C), temperature source Oral, resp. rate 20, height 5\' 11"  (1.803 m), weight 87.998 kg (194 lb).Body mass index is 27.07 kg/(m^2).   Past Medical History:  Past Medical History  Diagnosis Date  . Depression   . Schizophrenia   . Bipolar 1 disorder    No past surgical history on file. Family History: No family history on file. Social History:  History  Alcohol Use  . Yes     History  Drug Use No    History   Social History  . Marital Status: Single    Spouse Name: N/A  . Number of Children: N/A  . Years of Education: N/A   Social History Main Topics  . Smoking status: Current Every Day Smoker  . Smokeless tobacco: Not on file  . Alcohol Use: Yes  . Drug Use: No  . Sexual Activity: Not on file    Other Topics Concern  . Not on file   Social History Narrative   Past Psychiatric History: Hospitalizations:    Outpatient Care:  Substance Abuse Care:  Self-Mutilation:  Suicidal Attempts:  Violent Behaviors:   Risk to Self: What has been your use of drugs/alcohol within the last 12 months?: Marijuana "not that often, probably about 1-2 times a month" Risk to Others:   Prior Inpatient Therapy:   Prior Outpatient Therapy:    Level of Care:  OP  Hospital Course:   Ronald Martinez was admitted for Schizophrenia and crisis management.  He was treated with Haldol 5 mg for mood stabilization and Zoloft to 75 mg QDAY for affective sx.  Other medications listed below for further information.  Medical problems were identified and treated as needed.  Home medications were restarted as appropriate.  His moods gradually improved and he was more directable.  He did not exhibit any disruptive behaviors on the unit.    Improvement was monitored by observation and daily report of symptom reduction.  Emotional and mental status was monitored by clinical staff.         Ronald Martinez was evaluated by the treatment team for stability and plans for continued recovery upon discharge.  He was offered further treatment options upon discharge including Residential, Intensive Outpatient and Outpatient treatment.  [facility]     Ronald Martinez motivation was an integral factor for  scheduling further treatment.  Employment, transportation, bed availability, health status, family support, and any pending legal issues were also considered during his hospital stay.  Upon completion of this admission the patient was both mentally and medically stable for discharge denying suicidal/homicidal ideation, auditory/visual/tactile hallucinations, delusional thoughts and paranoia.      Consults:  psychiatry  Significant Diagnostic Studies:  labs: per  ED  Discharge Vitals:   Blood pressure 135/63, pulse 92, temperature 98.6 F (37 C), temperature source Oral, resp. rate 20, height  (1.803 m), weight 87.998 kg (194 lb). Body mass index is 27.07 kg/(m^2). Lab Results:   Results for orders placed or performed during the hospital encounter of 07/15/14 (from the past 72 hour(s))  TSH     Status: None   Collection Time: 07/19/14  6:39 AM  Result Value Ref Range   TSH 0.893 0.350 - 4.500 uIU/mL    Comment: Performed at [facility]  Lipid panel     Status: Abnormal   Collection Time: 07/19/14  6:39 AM  Result Value Ref Range   Cholesterol 169 0 - 200 mg/dL   Triglycerides 40 <098 mg/dL   HDL 60 >11 mg/dL   Total CHOL/HDL Ratio 2.8 RATIO   VLDL 8 0 - 40 mg/dL   LDL Cholesterol 914 (H) 0 - 99 mg/dL    Comment:        Total Cholesterol/HDL:CHD Risk Coronary Heart Disease Risk Table                     Men   Women  1/2 Average Risk   3.4   3.3  Average Risk       5.0   4.4  2 X Average Risk   9.6   7.1  3 X Average Risk  23.4   11.0        Use the calculated Patient Ratio above and the CHD Risk Table to determine the patient's CHD Risk.        ATP III CLASSIFICATION (LDL):  <100     mg/dL   Optimal  782-956  mg/dL   Near or Above                    Optimal  130-159  mg/dL   Borderline  213-086  mg/dL   High  >578     mg/dL   Very High Performed at [facility]   Hemoglobin A1c     Status: Abnormal   Collection Time: 07/19/14  6:39 AM  Result Value Ref Range   Hgb A1c MFr Bld 5.8 (H) 4.8 - 5.6 %    Comment: (NOTE)         Pre-diabetes: 5.7 - 6.4         Diabetes: >6.4         Glycemic control for adults with diabetes: <7.0    Mean Plasma Glucose 120 mg/dL    Comment: (NOTE) Performed At: [facility] [address] [place], [place] [id number] Mila Homer MD UX:[id number] Performed at [facility]    Physical Findings: AIMS:  , ,  ,  ,    CIWA:    COWS:     See  Psychiatric Specialty Exam and Suicide Risk Assessment completed by Attending Physician prior to discharge.  Discharge destination:  Home  Is patient on multiple antipsychotic therapies at discharge:  No   Has Patient had three or more failed trials of  antipsychotic monotherapy by history:  No  Recommended Plan for Multiple Antipsychotic Therapies: NA    Medication List    STOP taking these medications        clonazePAM 1 MG tablet  Commonly known as:  KLONOPIN      TAKE these medications      Indication   benztropine 0.5 MG tablet  Commonly known as:  COGENTIN  Take 1 tablet (0.5 mg total) by mouth 2 (two) times daily in the am and at bedtime..   Indication:  Extrapyramidal Reaction caused by Medications     gabapentin 300 MG capsule  Commonly known as:  NEURONTIN  Take 1 capsule (300 mg total) by mouth 3 (three) times daily at 8am, 3pm and bedtime.   Indication:  Agitation, Neuropathic Pain     haloperidol 5 MG tablet  Commonly known as:  HALDOL  Take 1 tablet (5 mg total) by mouth daily. In the morning.  Then take 2 tablets (10 mg) at bedtime   Indication:  Mood Stabilization     mirtazapine 7.5 MG tablet  Commonly known as:  REMERON  Take 1 tablet (7.5 mg total) by mouth at bedtime.   Indication:  Trouble Sleeping     nicotine 21 mg/24hr patch  Commonly known as:  NICODERM CQ - dosed in mg/24 hours  Place 1 patch (21 mg total) onto the skin daily.   Indication:  Nicotine Addiction     sertraline 25 MG tablet  Commonly known as:  ZOLOFT  Take 3 tablets (75 mg total) by mouth daily.   Indication:  Major Depressive Disorder           Follow-up Information    Follow up with [facility] On 09/07/2014.   Specialty:  Behavioral Health   Why:  Wednesday, April 6th at 2:30 for medication management. You can go to walk in clinic before appointment if needed. Monday- Friday 8:00am -10:00am    Contact information:   [address] [place] [phone]       Follow-up recommendations:  Activity:  as tol, diet as tol  Comments:  1.  Take all your medications as prescribed.              2.  Report any adverse side effects to outpatient provider.                       3.  Patient instructed to not use alcohol or illegal drugs while on prescription medicines.            4.  In the event of worsening symptoms, instructed patient to call 911, the crisis hotline or go to nearest emergency room for evaluation of symptoms.  Total Discharge Time:  30 min  Signed: Adonis Brook MAY, AGNP-BC 07/21/2014, 4:17 PM

## 2014-07-21 NOTE — Progress Notes (Signed)
Patient ID: Ronald Martinez, male   DOB: Oct 10, 1980, 34 y.o.   MRN: [id number] D: Patient reports he feels ready for discharge. Reports depression improved. Currently denies hearing any voices or having any SI.  A: Treatment team met and felt he was ready for discharge. Obtained all belongings, sample meds, prescriptions, and follow up appointment. R: Given bus pass and directions to the bus stop.

## 2014-07-21 NOTE — [facility] Suicide Risk Assessment (Signed)
[facility]   Demographic Factors:  Male  Total Time spent with patient: 30 minutes  Musculoskeletal: Strength & Muscle Tone: within normal limits Gait & Station: normal Patient leans: N/A  Psychiatric Specialty Exam: Physical Exam  Review of Systems  Psychiatric/Behavioral: Negative for depression, suicidal ideas, hallucinations and substance abuse. The patient is not nervous/anxious and does not have insomnia.     Blood pressure 135/63, pulse 92, temperature 98.6 F (37 C), temperature source Oral, resp. rate 20, height 5\' 11"  (1.803 m), weight 87.998 kg (194 lb).Body mass index is 27.07 kg/(m^2).  General Appearance: Fairly Groomed  Patent attorneyye Contact::  Fair  Speech:  Normal Rate409  Volume:  Normal  Mood:  Euthymic  Affect:  Congruent  Thought Process:  Coherent  Orientation:  Full (Time, Place, and Person)  Thought Content:  WDL  Suicidal Thoughts:  No  Homicidal Thoughts:  No  Memory:  Immediate;   Fair Recent;   Fair Remote;   Fair  Judgement:  Fair  Insight:  Fair  Psychomotor Activity:  Normal  Concentration:  Fair  Recall:  FiservFair  Fund of Knowledge:Fair  Language: Fair  Akathisia:  No  Handed:  Right  AIMS (if indicated):     Assets:  Communication Skills Desire for Improvement Physical Health Social Support  Sleep:  Number of Hours: 6.75  Cognition: WNL  ADL's:  Intact   Have you used any form of tobacco in the last 30 days? (Cigarettes, Smokeless Tobacco, Cigars, and/or Pipes): No  Has this patient used any form of tobacco in the last 30 days? (Cigarettes, Smokeless Tobacco, Cigars, and/or Pipes) Yes, patient prefers to have nicotine patch ,which will be provided.  Mental Status Per Nursing Assessment::   On Admission:  NA  Current Mental Status by Physician: patient denies SI/HI/AH/VH  Loss Factors: NA  Historical Factors: Impulsivity  Risk Reduction Factors:   Positive social support  Continued Clinical Symptoms:   Previous Psychiatric Diagnoses and Treatments  Cognitive Features That Contribute To Risk:  Polarized thinking    Suicide Risk:  Minimal: No identifiable suicidal ideation.  Patients presenting with no risk factors but with morbid ruminations; may be classified as minimal risk based on the severity of the depressive symptoms  Principal Problem: Schizophrenia, MULTIPLE EPISODES , CURRENTLY IN ACUTE EPISODE (ACUTE PHASE - RESOLVED) Discharge Diagnoses:  Patient Active Problem List   Diagnosis Date Noted  . Schizophrenia [F20.9] 07/18/2014    Follow-up Information    [facility]   Specialty:  Behavioral Health   Why:  Wednesday, April 6th at 2:30 for medication management. You can go to walk in clinic before appointment if needed. Monday- Friday 8:00am -10:00am    Contact information:   [address] [place] [address] [phone]       Plan Of Care/Follow-up recommendations:  Activity:  No restrictions Diet:  regular Tests:  as needed Other:  follow up with aftercare  Is patient on multiple antipsychotic therapies at discharge:  No   Has Patient had three or more failed trials of antipsychotic monotherapy by history:  No  Recommended Plan for Multiple Antipsychotic Therapies: NA    Tara Wich MD 07/21/2014, 9:29 AM

## 2014-07-21 NOTE — Progress Notes (Signed)
  [facility] :  Will you be returning to the same living situation after discharge:  Yes,  home At discharge, do you have transportation home?: Yes,  bus pass Do you have the ability to pay for your medications: Yes,  MCD  Release of information consent forms completed and in the chart;  Patient's signature needed at discharge.  Patient to Follow up at: Follow-up Information    [facility]   Specialty:  Behavioral Health   Why:  Wednesday, April 6th at 2:30 for medication management. You can go to walk in clinic before appointment if needed. Monday- Friday 8:00am -10:00am    Contact information:   [address] [place] [address] [phone]       Patient denies SI/HI: Yes,  yes    Safety Planning and Suicide Prevention discussed: Yes,  yes  Have you used any form of tobacco in the last 30 days? (Cigarettes, Smokeless Tobacco, Cigars, and/or Pipes): No  Has patient been referred to the Quitline?: Yes, faxed on 07/17/14  Daryel Geraldorth, Meleah Demeyer B 07/21/2014, 10:30 AM

## 2014-07-25 NOTE — Progress Notes (Signed)
Patient Discharge Instructions:  After Visit Summary (AVS):   Faxed to:  07/25/14 Discharge Summary Note:   Faxed to:  07/25/14 Psychiatric Admission Assessment Note:   Faxed to:  07/25/14 Suicide Risk Assessment - Discharge Assessment:   Faxed to:  07/25/14 Faxed/Sent to the Next Level Care provider:  07/25/14 [facility] to [facility] @ [phone]  Jerelene ReddenSheena E [place], 07/25/2014, 3:13 PM

## 2014-08-15 ENCOUNTER — Emergency Department (EMERGENCY_DEPARTMENT_HOSPITAL)
Admission: EM | Admit: 2014-08-15 | Discharge: 2014-08-16 | Disposition: A | Discharge status: Other Acute Inpatient Hospital | Payer: Medicaid Other | Source: Home / Self Care | Attending: Emergency Medicine | Admitting: Emergency Medicine

## 2014-08-15 ENCOUNTER — Encounter (HOSPITAL_COMMUNITY): Payer: Self-pay | Admitting: Emergency Medicine

## 2014-08-15 DIAGNOSIS — Z9114 Patient's other noncompliance with medication regimen: Secondary | ICD-10-CM

## 2014-08-15 DIAGNOSIS — R45851 Suicidal ideations: Secondary | ICD-10-CM

## 2014-08-15 LAB — COMPREHENSIVE METABOLIC PANEL
ALK PHOS: 81 U/L (ref 39–117)
ALT: 19 U/L (ref 0–53)
AST: 27 U/L (ref 0–37)
Albumin: 3.7 g/dL (ref 3.5–5.2)
Anion gap: 5 (ref 5–15)
BUN: 10 mg/dL (ref 6–23)
CALCIUM: 9 mg/dL (ref 8.4–10.5)
CO2: 27 mmol/L (ref 19–32)
Chloride: 108 mmol/L (ref 96–112)
Creatinine, Ser: 0.85 mg/dL (ref 0.50–1.35)
GFR calc Af Amer: >90 mL/min (ref >90)
GFR calc non Af Amer: >90 mL/min (ref >90)
Glucose, Bld: 100 mg/dL — ABNORMAL HIGH (ref 70–99)
POTASSIUM: 4.1 mmol/L (ref 3.5–5.1)
Sodium: 140 mmol/L (ref 135–145)
TOTAL PROTEIN: 7.5 g/dL (ref 6.0–8.3)
Total Bilirubin: 0.4 mg/dL (ref 0.3–1.2)

## 2014-08-15 LAB — RAPID URINE DRUG SCREEN, HOSP PERFORMED
Amphetamines: NOT DETECTED
BARBITURATES: NOT DETECTED
Benzodiazepines: NOT DETECTED
Cocaine: NOT DETECTED
Opiates: NOT DETECTED
Tetrahydrocannabinol: POSITIVE — AB

## 2014-08-15 LAB — SALICYLATE LEVEL: Salicylate Lvl: <4 mg/dL (ref 2.8–20.0)

## 2014-08-15 LAB — CBC
HCT: 40.7 % (ref 39.0–52.0)
Hemoglobin: 13.3 g/dL (ref 13.0–17.0)
MCH: 26.7 pg (ref 26.0–34.0)
MCHC: 32.7 g/dL (ref 30.0–36.0)
MCV: 81.6 fL (ref 78.0–100.0)
Platelets: 341 10*3/uL (ref 150–400)
RBC: 4.99 MIL/uL (ref 4.22–5.81)
RDW: 15.2 % (ref 11.5–15.5)
WBC: 6.2 10*3/uL (ref 4.0–10.5)

## 2014-08-15 LAB — ACETAMINOPHEN LEVEL

## 2014-08-15 LAB — ETHANOL: Alcohol, Ethyl (B): <5 mg/dL (ref 0–9)

## 2014-08-15 MED ORDER — IBUPROFEN 400 MG PO TABS: 600.0000 mg | ORAL_TABLET | Freq: Three times a day (TID) | ORAL | Status: DC | PRN

## 2014-08-15 MED ORDER — ZOLPIDEM TARTRATE 5 MG PO TABS: 5.0000 mg | ORAL_TABLET | Freq: Every evening | ORAL | Status: DC | PRN

## 2014-08-15 MED ORDER — ONDANSETRON HCL 4 MG PO TABS: 4.0000 mg | ORAL_TABLET | Freq: Three times a day (TID) | ORAL | Status: DC | PRN

## 2014-08-15 MED ORDER — ALUM & MAG HYDROXIDE-SIMETH 200-200-20 MG/5ML PO SUSP: 30.0000 mL | ORAL | Status: DC | PRN

## 2014-08-15 MED ORDER — NICOTINE 21 MG/24HR TD PT24
21.0000 mg | MEDICATED_PATCH | Freq: Every day | TRANSDERMAL | Status: DC
  Filled 2014-08-15: qty 1

## 2014-08-15 MED ORDER — LORAZEPAM 1 MG PO TABS: 1.0000 mg | ORAL_TABLET | Freq: Three times a day (TID) | ORAL | Status: DC | PRN

## 2014-08-15 MED ORDER — ACETAMINOPHEN 325 MG PO TABS: 650.0000 mg | ORAL_TABLET | ORAL | Status: DC | PRN

## 2014-08-15 NOTE — ED Notes (Signed)
Pt. Changing into scrubs and security notified to come wand pt.

## 2014-08-15 NOTE — ED Provider Notes (Signed)
CSN: [id number]     Arrival date & time 08/15/14  2023 History   First MD Initiated Contact with Patient 08/15/14 2214     Chief Complaint  Patient presents with  . Medical Clearance  . Foot Pain     HPI Patient presents to the emergency department complaining of visual hallucinations.  He reports noncompliance with his medications.  He has long-standing history of mental health illness.  He is currently not working with his psychiatrist.  He denies homicidal ideation.  He denies command hallucinations.  He does report suicidal thoughts.  He is concerned about his safety.  He thinks the medications he's been placed on the past "never works".   Past Medical History  Diagnosis Date  . Depression   . Schizophrenia   . Bipolar 1 disorder    History reviewed. No pertinent past surgical history. No family history on file. History  Substance Use Topics  . Smoking status: Current Every Day Smoker  . Smokeless tobacco: Not on file  . Alcohol Use: Yes    Review of Systems  All other systems reviewed and are negative.     Allergies  Review of patient's allergies indicates no known allergies.  Home Medications   Prior to Admission medications   Medication Sig Start Date End Date Taking? Authorizing Provider  gabapentin (NEURONTIN) 300 MG capsule Take 1 capsule (300 mg total) by mouth 3 (three) times daily at 8am, 3pm and bedtime. 07/21/14  Yes Adonis Brook, NP  haloperidol (HALDOL) 5 MG tablet Take 1 tablet (5 mg total) by mouth daily. In the morning.  Then take 2 tablets (10 mg) at bedtime 07/21/14  Yes Adonis Brook, NP  mirtazapine (REMERON) 7.5 MG tablet Take 1 tablet (7.5 mg total) by mouth at bedtime. 07/21/14  Yes Adonis Brook, NP  sertraline (ZOLOFT) 25 MG tablet Take 3 tablets (75 mg total) by mouth daily. 07/21/14  Yes Adonis Brook, NP  nicotine (NICODERM CQ - DOSED IN MG/24 HOURS) 21 mg/24hr patch Place 1 patch (21 mg total) onto the skin daily. Patient not taking:  Reported on 08/15/2014 07/21/14   Adonis Brook, NP   BP 124/76 mmHg  Pulse 67  Temp(Src) 98.2 F (36.8 C) (Oral)  Resp 16  SpO2 100% Physical Exam  Constitutional: He is oriented to person, place, and time. He appears well-developed and well-nourished.  HENT:  Head: Normocephalic and atraumatic.  Eyes: EOM are normal.  Neck: Normal range of motion.  Cardiovascular: Normal rate, regular rhythm, normal heart sounds and intact distal pulses.   Pulmonary/Chest: Effort normal and breath sounds normal. No respiratory distress.  Abdominal: Soft. He exhibits no distension. There is no tenderness.  Musculoskeletal: Normal range of motion.  Neurological: He is alert and oriented to person, place, and time.  Skin: Skin is warm and dry.  Psychiatric:  Anxious.  Suicidal ideation  Nursing note and vitals reviewed.   ED Course  Procedures (including critical care time) Labs Review Labs Reviewed  ACETAMINOPHEN LEVEL - Abnormal; Notable for the following:    Acetaminophen (Tylenol), Serum <10.0 (*)    All other components within normal limits  COMPREHENSIVE METABOLIC PANEL - Abnormal; Notable for the following:    Glucose, Bld 100 (*)    All other components within normal limits  URINE RAPID DRUG SCREEN (HOSP PERFORMED) - Abnormal; Notable for the following:    Tetrahydrocannabinol POSITIVE (*)    All other components within normal limits  CBC  ETHANOL  SALICYLATE LEVEL  Imaging Review No results found.   EKG Interpretation None      MDM   Final diagnoses:  None    Patient is medically clear at this time.  I will ask behavior health to assess the patient for mental stability.    Azalia BilisKevin Dajsha Massaro, MD 08/15/14 [phone]

## 2014-08-15 NOTE — ED Notes (Signed)
Pt sts he doesn't think his medications are working.

## 2014-08-15 NOTE — ED Notes (Signed)
Staffing and charge RN notified of pt.

## 2014-08-15 NOTE — ED Notes (Signed)
Pt reports pain to calluses on bottoms of feet. Also here d/t feeling suicidal. sts he hears negative voices and feels like he wants to jump off a bridge.

## 2014-08-15 NOTE — ED Notes (Signed)
Pt c/o bilateral feet pain. Callouses noted, skin intact. Pt also reports hearing voices telling him to jump off a bridge. [facility], started on new medications. Pt has not been taking his medications x 1 week. Sts "they aren't helping me. When I was at behavioral health, I felt like they were trying to push me out the door, so I told them the medicines were working. I didn't want to be bothering the staff." Pt denies visual hallucinations. Pt appears to be distracted during assessment.

## 2014-08-16 ENCOUNTER — Encounter (HOSPITAL_COMMUNITY): Payer: Self-pay

## 2014-08-16 ENCOUNTER — Inpatient Hospital Stay (HOSPITAL_COMMUNITY)
Admission: AD | Admit: 2014-08-16 | Discharge: 2014-08-17 | DRG: 885 | Disposition: A | Discharge status: Home / Self Care | Payer: Medicaid Other | Source: Intra-hospital | Attending: Psychiatry | Admitting: Psychiatry

## 2014-08-16 DIAGNOSIS — F1290 Cannabis use, unspecified, uncomplicated: Secondary | ICD-10-CM | POA: Diagnosis not present

## 2014-08-16 DIAGNOSIS — F17210 Nicotine dependence, cigarettes, uncomplicated: Secondary | ICD-10-CM | POA: Diagnosis present

## 2014-08-16 DIAGNOSIS — F1220 Cannabis dependence, uncomplicated: Secondary | ICD-10-CM | POA: Diagnosis present

## 2014-08-16 DIAGNOSIS — Z9114 Patient's other noncompliance with medication regimen: Secondary | ICD-10-CM | POA: Diagnosis present

## 2014-08-16 DIAGNOSIS — F250 Schizoaffective disorder, bipolar type: Secondary | ICD-10-CM | POA: Diagnosis present

## 2014-08-16 DIAGNOSIS — F251 Schizoaffective disorder, depressive type: Secondary | ICD-10-CM | POA: Insufficient documentation

## 2014-08-16 DIAGNOSIS — F1010 Alcohol abuse, uncomplicated: Secondary | ICD-10-CM | POA: Diagnosis present

## 2014-08-16 DIAGNOSIS — Z720 Tobacco use: Secondary | ICD-10-CM | POA: Diagnosis not present

## 2014-08-16 DIAGNOSIS — R45851 Suicidal ideations: Secondary | ICD-10-CM | POA: Diagnosis not present

## 2014-08-16 MED ORDER — ALUM & MAG HYDROXIDE-SIMETH 200-200-20 MG/5ML PO SUSP: 30.0000 mL | ORAL | Status: DC | PRN

## 2014-08-16 MED ORDER — MAGNESIUM HYDROXIDE 400 MG/5ML PO SUSP: 30.0000 mL | Freq: Every day | ORAL | Status: DC | PRN

## 2014-08-16 MED ORDER — OLANZAPINE 5 MG PO TBDP
5.0000 mg | ORAL_TABLET | Freq: Three times a day (TID) | ORAL | Status: DC | PRN
  Administered 2014-08-16: 5 mg via ORAL
  Filled 2014-08-16: qty 1

## 2014-08-16 MED ORDER — ACETAMINOPHEN 325 MG PO TABS
650.0000 mg | ORAL_TABLET | ORAL | Status: DC | PRN
  Administered 2014-08-16: 650 mg via ORAL
  Filled 2014-08-16: qty 2

## 2014-08-16 MED ORDER — LORAZEPAM 1 MG PO TABS
1.0000 mg | ORAL_TABLET | ORAL | Status: AC | PRN
  Administered 2014-08-16: 1 mg via ORAL
  Filled 2014-08-16: qty 1

## 2014-08-16 MED ORDER — NICOTINE 21 MG/24HR TD PT24
21.0000 mg | MEDICATED_PATCH | Freq: Every day | TRANSDERMAL | Status: DC
  Administered 2014-08-17: 21 mg via TRANSDERMAL
  Filled 2014-08-16 (×4): qty 1

## 2014-08-16 MED ORDER — ZIPRASIDONE MESYLATE 20 MG IM SOLR: 20.0000 mg | INTRAMUSCULAR | Status: DC | PRN

## 2014-08-16 NOTE — ED Notes (Signed)
[facility] - will return call.

## 2014-08-16 NOTE — ED Notes (Signed)
Belongings inventoried and placed in container

## 2014-08-16 NOTE — ED Notes (Signed)
ATTEMPTED TO CALL REPORT TO [facility]. WILL RETURN CALL.

## 2014-08-16 NOTE — ED Notes (Signed)
Telepsych being performed. 
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
## 2014-08-16 NOTE — ED Notes (Signed)
TTS at in progress

## 2014-08-16 NOTE — Progress Notes (Signed)
Pt was admitted to [facility] hall with SI complaints. Ronald Martinez says he has chronic foot pain that disturbs him. He says he has had thoughts of jumping off a bridge and hears voices saying that he does not deserve to live. He said he last heard voices while in the ED and is not currently suicidal. His feet appear dry, scaly and calloused bilaterally. Skin assessment/search otherwise unremarkable. He says his feet itch as well. He said he wants to be back on "old medications" -- last time he was here, he says, he asked the MD to d/c Klonopin. He says he has since found that it's the only thing that helps. He is currently staying with friends but is uncertain if he will be able to return to that situation due to neighbors who are nosy and "look for things to tell the landlord."  He says he may be a little paranoid, He contracts for safety and is calm/cooperative. Will continue to monitor for needs and safety.

## 2014-08-16 NOTE — Tx Team (Signed)
Initial Interdisciplinary Treatment Plan   PATIENT STRESSORS: Health problems   PATIENT STRENGTHS: Capable of independent living   PROBLEM LIST: Problem List/Patient Goals Date to be addressed Date deferred Reason deferred Estimated date of resolution  Suicidal ideation 08/16/2014     Auditory hallucinations 08/16/2014                                                DISCHARGE CRITERIA:  Medical problems require only outpatient monitoring Safe-care adequate arrangements made Verbal commitment to aftercare and medication compliance  PRELIMINARY DISCHARGE PLAN: Outpatient therapy  PATIENT/FAMIILY INVOLVEMENT: This treatment plan has been presented to and reviewed with the patient, Ronald Martinez, and/or family member.  The patient and family have been given the opportunity to ask questions and make suggestions.  Ronald Martinez, Ronald Martinez M 08/16/2014, 3:41 PM

## 2014-08-16 NOTE — ED Notes (Signed)
Dr Rees in w/pt. 
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
## 2014-08-16 NOTE — [facility] Group Notes (Signed)
Adult Psychoeducational Group Note  Date:  08/16/2014 Time:  11:56 PM  Group Topic/Focus:  Wrap-Up Group:   The focus of this group is to help patients review their daily goal of treatment and discuss progress on daily workbooks.  Participation Level:  Did Not Attend  Participation Quality:  None  Affect:  None  Cognitive:  None  Insight: None  Engagement in Group:  None  Modes of Intervention:  Discussion  Additional Comments:  Caryn BeeKevin did not attend group.  Caroll RancherLindsay, Neeta Storey A 08/16/2014, 11:56 PM

## 2014-08-17 ENCOUNTER — Emergency Department (HOSPITAL_COMMUNITY)
Admission: EM | Admit: 2014-08-17 | Discharge: 2014-08-17 | Disposition: A | Discharge status: Home / Self Care | Payer: Medicaid Other | Attending: Emergency Medicine | Admitting: Emergency Medicine

## 2014-08-17 ENCOUNTER — Encounter (HOSPITAL_COMMUNITY): Payer: Self-pay | Admitting: Emergency Medicine

## 2014-08-17 ENCOUNTER — Encounter (HOSPITAL_COMMUNITY): Payer: Self-pay | Admitting: Psychiatry

## 2014-08-17 DIAGNOSIS — F1910 Other psychoactive substance abuse, uncomplicated: Secondary | ICD-10-CM

## 2014-08-17 DIAGNOSIS — F1010 Alcohol abuse, uncomplicated: Secondary | ICD-10-CM | POA: Insufficient documentation

## 2014-08-17 DIAGNOSIS — F250 Schizoaffective disorder, bipolar type: Principal | ICD-10-CM

## 2014-08-17 DIAGNOSIS — F1290 Cannabis use, unspecified, uncomplicated: Secondary | ICD-10-CM

## 2014-08-17 DIAGNOSIS — F1220 Cannabis dependence, uncomplicated: Secondary | ICD-10-CM | POA: Diagnosis present

## 2014-08-17 DIAGNOSIS — Z720 Tobacco use: Secondary | ICD-10-CM | POA: Insufficient documentation

## 2014-08-17 MED ORDER — BENZTROPINE MESYLATE 0.5 MG PO TABS: 0.5000 mg | ORAL_TABLET | Freq: Two times a day (BID) | ORAL | Status: DC

## 2014-08-17 MED ORDER — OLANZAPINE 5 MG PO TBDP: 5.0000 mg | ORAL_TABLET | Freq: Four times a day (QID) | ORAL | Status: DC | PRN

## 2014-08-17 MED ORDER — HALOPERIDOL 5 MG PO TABS: 5.0000 mg | ORAL_TABLET | Freq: Two times a day (BID) | ORAL | Status: DC

## 2014-08-17 MED ORDER — BENZTROPINE MESYLATE 0.5 MG PO TABS
0.5000 mg | ORAL_TABLET | Freq: Two times a day (BID) | ORAL | Status: DC
  Filled 2014-08-17 (×3): qty 1
  Filled 2014-08-17 (×2): qty 6
  Filled 2014-08-17: qty 1

## 2014-08-17 MED ORDER — HALOPERIDOL 5 MG PO TABS
5.0000 mg | ORAL_TABLET | Freq: Two times a day (BID) | ORAL | Status: DC
  Administered 2014-08-17: 5 mg via ORAL
  Filled 2014-08-17 (×2): qty 1
  Filled 2014-08-17: qty 6
  Filled 2014-08-17 (×2): qty 1
  Filled 2014-08-17: qty 6

## 2014-08-17 NOTE — Progress Notes (Signed)
DISCHARGE NOTE: D: Patient was alert, oriented, in stable condition, and ambulatory with a steady gait upon discharge. Pt denies SI/HI and AVH. A: AVS reviewed and given to pt. Follow up reviewed with pt. Resources reviewed with pt, including [facility]. Prescriptions/Medications given to pt. Belongings returned to pt. Pt given time to ask questions and express concerns. R: Pt D/C'd with bus pass and pt given directions to nearest bus stop.

## 2014-08-17 NOTE — ED Notes (Signed)
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  Dr. Patria Maneampos has examined pt at triage and discharge paperwork ready.

## 2014-08-17 NOTE — Progress Notes (Signed)
D: Patient is alert and oriented. Pt's mood and affect is angry, irritable, labile. Pt denies SI/HI and AVH. Pt is verbally aggressive this morning and is demanding Klonipin. Minerva AreolaEric, [facility] with [facility] at 0935 discussing medication regimen and hospital policy. Pt refuses to take 0800am Cogentin dose. Pt states "I know what works for my body and these damn doctors don't listen!" Pt paces the halls, stomps feet at times. Pt speech is stuttered at times. A: Pt offered PRN zyprexa, pt refused. Encouragement/Support provided to pt. Active listening by RN. 15 minute checks continued per protocol for patient safety.  R: Pt not adherent to treatment regimen. Pt remains safe.

## 2014-08-17 NOTE — H&P (Signed)
Psychiatric Admission Assessment Adult  Patient Identification: Ronald Martinez MRN:  [id number] Date of Evaluation:  08/17/2014  Chief Complaint:Patient states "Bitch ,I want klonopin, I am not willing to take any other medications.'     Principal Diagnosis: Schizoaffective disorder, bipolar type Diagnosis:   Primary Psychiatric Diagnosis: Schizoaffective disorder, Bipolar type   Secondary Psychiatric Diagnosis: Cannabis use disorder, severe R/O Sedative hypnotic anxiolytic use disorder unspecified ( klonopin)  Non Psychiatric Diagnosis: See pmh Patient Active Problem List   Diagnosis Date Noted  . Cannabis use disorder, severe, dependence [F12.20] 08/17/2014  . Schizoaffective disorder, bipolar type [F25.0] 08/16/2014      History of Present Illness:  Ronald Martinez is an 34 y.o. [place] ,single , on SSD, lives with a friend in [place] . Per initial notes in EHR , based on initial evaluation ; "Patient is more than likely malingering. He has been in the emergency rooms before and had similar complaints of SI and hallucinations. Patient chooses not to go to [facility] and when he does he does not stay for walk-in clinic."   Patient seen today. Pt was very irritable and upset ,very focussed on getting back on Klonopin, did not want to discuss any other treatment options. When other medications were discussed pt closed his eyes, turned his head to the side and got verbally abusive . Pt reported that Klonopin was prescribed to him by his outpatient psychiatrist. However patient was here on 07/16/14 and at that time was taken off of his klonopin and was started on Haldol, zoloft and Remeron ( per pt request) . Per review of [place] controlled substance database - pt got Klonopin prescribed 0n 03/18/14 - by Pauline Good and then on 04/14/14 by Alemu Mengistu . Pt had refills from his previous script which he refilled after getting out the hospital during his last admission, inspite of being  asked to stop it. Pt did not go to [facility] after being discharged from here. Pt has an upcoming appointment in April. Pt does not want to go to walk ins. Pt is focussed only on Klonopin , became verbally abusive , calling "Bitch , go back to where you came from ", when he was asked to consider other options. Pt wants to be discharged home .      Elements:  Location: irritable, fixed on getting klonopin prescribed, ? Malingering per initial evaluation in ED. Quality:  Verbally abusive , getting agitated, not cooperative with any treatment plan other than klonopin Severity: moderate Timing:  Few days Duration:  Chronic getting worse Context: Hx of schizoaffective disorder, noncompliant with medications , wants only klonopin  Associated Signs/Symptoms: Depression Symptoms:  anxiety,had SI on admission per ED NOTES, declined it on evaluation here. (Hypo) Manic Symptoms:  Impulsivity, Irritable Mood, Labiality of Mood, Anxiety Symptoms:  Social Anxiety, Psychotic Symptoms:  Hallucinations: Auditory, says bad things PTSD Symptoms: NA Total Time spent with patient: 1 hour  Past Medical History:  Past Medical History  Diagnosis Date  . Depression   . Schizophrenia   . Bipolar 1 disorder    History reviewed. No pertinent past surgical history. Family History: History reviewed. No pertinent family history. Social History:  History  Alcohol Use No    Comment: Denies ETOH     History  Drug Use  . Yes  . Special: Marijuana, Benzodiazepines    History   Social History  . Marital Status: Single    Spouse Name: N/A  . Number of Children: N/A  . Years  of Education: N/A   Social History Main Topics  . Smoking status: Current Every Day Smoker -- 1.00 packs/day    Types: Cigarettes  . Smokeless tobacco: Not on file  . Alcohol Use: No     Comment: Denies ETOH  . Drug Use: Yes    Special: Marijuana, Benzodiazepines  . Sexual Activity: Not on file   Other Topics Concern  . None    Social History Narrative   Additional Social History: Born in [place] and now lives in [place].   Is not married and does not have any children.  He lives with a friend.  Mother still living and they have a good relationship.  His father is deceased.    Musculoskeletal: Strength & Muscle Tone: within normal limits Gait & Station: normal Patient leans: N/A  Psychiatric Specialty Exam: Physical Exam  Vitals reviewed. Constitutional: He is oriented to person, place, and time. He appears well-developed.  HENT:  Head: Normocephalic.  Eyes: Conjunctivae and EOM are normal.  Neck: Normal range of motion.  Musculoskeletal: Normal range of motion.  Neurological: He is alert and oriented to person, place, and time.  Skin: Skin is warm.  Psychiatric: His speech is normal. Thought content normal. His mood appears anxious. He is agitated. Cognition and memory are normal. He expresses impulsivity.    Review of Systems  Constitutional: Negative.   HENT: Negative.   Eyes: Negative.   Respiratory: Negative.   Cardiovascular: Negative.   Genitourinary: Negative.   Musculoskeletal: Negative.   Skin: Negative.   Neurological: Negative.   Psychiatric/Behavioral: Positive for hallucinations and substance abuse. Negative for depression and suicidal ideas. The patient is nervous/anxious. The patient does not have insomnia.     Blood pressure 114/81, pulse 95, temperature 97.7 F (36.5 C), temperature source Oral, resp. rate 18, height 5' 11.5" (1.816 m), weight 89.585 kg (197 lb 8 oz), SpO2 100 %.Body mass index is 27.16 kg/(m^2).  General Appearance: Fairly Groomed  Engineer, water::  Good  Speech:  Normal Rate, hx of stuttering  Volume:  Increased  Mood:  Anxious  Affect:  Labile  Thought Process:  Coherent  Orientation:  Full (Time, Place, and Person)  Thought Content:  Hallucinations: Auditorypt reports AH of badthings, but denies it now, pt is more focussed on getting Klonopin  Suicidal Thoughts:   No  Homicidal Thoughts:  No  Memory:  Immediate;   Fair Recent;   Fair Remote;   Fair  Judgement:  Fair  Insight:  Fair  Psychomotor Activity:  NA  Concentration:  Fair  Recall:  [employer] of Knowledge:Fair  Language: Good  Akathisia:  Negative  Handed:  Right  AIMS (if indicated):     Assets:  Communication Skills Desire for Improvement Physical Health Resilience  ADL's:  Intact  Cognition: WNL  Sleep:  Number of Hours: 5.75   Risk to Self: Is patient at risk for suicide?: Yes Risk to Others:  Pt is aggressive on the unit , verbally abusive towards staff since he will not be prescribed klonopin. Prior Inpatient Therapy:  yes,[facility] Prior Outpatient Therapy:   [name]  Alcohol Screening: 1. How often do you have a drink containing alcohol?: Never 9. Have you or someone else been injured as a result of your drinking?: No 10. Has a relative or friend or a doctor or another health worker been concerned about your drinking or suggested you cut down?: No Alcohol Use Disorder Identification Test Final Score (AUDIT): 0 Brief Intervention: AUDIT score  less than 7 or less-screening does not suggest unhealthy drinking-brief intervention not indicated  Allergies:   Allergies  Allergen Reactions  . Pearlean Brownie Meat] Nausea And Vomiting    Allergic to bologna and salami   Lab Results:  Results for orders placed or performed during the hospital encounter of 08/15/14 (from the past 48 hour(s))  Acetaminophen level     Status: Abnormal   Collection Time: 08/15/14  8:37 PM  Result Value Ref Range   Acetaminophen (Tylenol), Serum <10.0 (L) 10 - 30 ug/mL    Comment:        THERAPEUTIC CONCENTRATIONS VARY SIGNIFICANTLY. A RANGE OF 10-30 ug/mL MAY BE AN EFFECTIVE CONCENTRATION FOR MANY PATIENTS. HOWEVER, SOME ARE BEST TREATED AT CONCENTRATIONS OUTSIDE THIS RANGE. ACETAMINOPHEN CONCENTRATIONS >150 ug/mL AT 4 HOURS AFTER INGESTION AND >50 ug/mL AT 12 HOURS AFTER INGESTION  ARE OFTEN ASSOCIATED WITH TOXIC REACTIONS.   CBC     Status: None   Collection Time: 08/15/14  8:37 PM  Result Value Ref Range   WBC 6.2 4.0 - 10.5 K/uL   RBC 4.99 4.22 - 5.81 MIL/uL   Hemoglobin 13.3 13.0 - 17.0 g/dL   HCT 40.7 39.0 - 52.0 %   MCV 81.6 78.0 - 100.0 fL   MCH 26.7 26.0 - 34.0 pg   MCHC 32.7 30.0 - 36.0 g/dL   RDW 15.2 11.5 - 15.5 %   Platelets 341 150 - 400 K/uL  Comprehensive metabolic panel     Status: Abnormal   Collection Time: 08/15/14  8:37 PM  Result Value Ref Range   Sodium 140 135 - 145 mmol/L   Potassium 4.1 3.5 - 5.1 mmol/L   Chloride 108 96 - 112 mmol/L   CO2 27 19 - 32 mmol/L   Glucose, Bld 100 (H) 70 - 99 mg/dL   BUN 10 6 - 23 mg/dL   Creatinine, Ser 0.85 0.50 - 1.35 mg/dL   Calcium 9.0 8.4 - 10.5 mg/dL   Total Protein 7.5 6.0 - 8.3 g/dL   Albumin 3.7 3.5 - 5.2 g/dL   AST 27 0 - 37 U/L   ALT 19 0 - 53 U/L   Alkaline Phosphatase 81 39 - 117 U/L   Total Bilirubin 0.4 0.3 - 1.2 mg/dL   GFR calc non Af Amer >90 >90 mL/min   GFR calc Af Amer >90 >90 mL/min    Comment: (NOTE) The eGFR has been calculated using the CKD EPI equation. This calculation has not been validated in all clinical situations. eGFR's persistently <90 mL/min signify possible Chronic Kidney Disease.    Anion gap 5 5 - 15  Ethanol (ETOH)     Status: None   Collection Time: 08/15/14  8:37 PM  Result Value Ref Range   Alcohol, Ethyl (B) <5 0 - 9 mg/dL    Comment:        LOWEST DETECTABLE LIMIT FOR SERUM ALCOHOL IS 11 mg/dL FOR MEDICAL PURPOSES ONLY   Salicylate level     Status: None   Collection Time: 08/15/14  8:37 PM  Result Value Ref Range   Salicylate Lvl <9.6 2.8 - 20.0 mg/dL  Urine Drug Screen     Status: Abnormal   Collection Time: 08/15/14  8:45 PM  Result Value Ref Range   Opiates NONE DETECTED NONE DETECTED   Cocaine NONE DETECTED NONE DETECTED   Benzodiazepines NONE DETECTED NONE DETECTED   Amphetamines NONE DETECTED NONE DETECTED   Tetrahydrocannabinol  POSITIVE (A) NONE DETECTED  Barbiturates NONE DETECTED NONE DETECTED    Comment:        DRUG SCREEN FOR MEDICAL PURPOSES ONLY.  IF CONFIRMATION IS NEEDED FOR ANY PURPOSE, NOTIFY LAB WITHIN 5 DAYS.        LOWEST DETECTABLE LIMITS FOR URINE DRUG SCREEN Drug Class       Cutoff (ng/mL) Amphetamine      1000 Barbiturate      200 Benzodiazepine   540 Tricyclics       086 Opiates          300 Cocaine          300 THC              50    Current Medications: Current Facility-Administered Medications  Medication Dose Route Frequency Provider Last Rate Last Dose  . acetaminophen (TYLENOL) tablet 650 mg  650 mg Oral Q4H PRN Lurena Nida, NP   650 mg at 08/16/14 2021  . alum & mag hydroxide-simeth (MAALOX/MYLANTA) 200-200-20 MG/5ML suspension 30 mL  30 mL Oral PRN Lurena Nida, NP      . benztropine (COGENTIN) tablet 0.5 mg  0.5 mg Oral BID Ursula Alert, MD   0.5 mg at 08/17/14 0931  . haloperidol (HALDOL) tablet 5 mg  5 mg Oral BID Ursula Alert, MD   5 mg at 08/17/14 0931  . magnesium hydroxide (MILK OF MAGNESIA) suspension 30 mL  30 mL Oral Daily PRN Lurena Nida, NP      . nicotine (NICODERM CQ - dosed in mg/24 hours) patch 21 mg  21 mg Transdermal Daily Lurena Nida, NP   21 mg at 08/17/14 0824  . OLANZapine zydis (ZYPREXA) disintegrating tablet 5 mg  5 mg Oral Q6H PRN Ursula Alert, MD       PTA Medications: Prescriptions prior to admission  Medication Sig Dispense Refill Last Dose  . gabapentin (NEURONTIN) 300 MG capsule Take 1 capsule (300 mg total) by mouth 3 (three) times daily at 8am, 3pm and bedtime. 90 capsule 0 Past Week at Unknown time  . haloperidol (HALDOL) 5 MG tablet Take 1 tablet (5 mg total) by mouth daily. In the morning.  Then take 2 tablets (10 mg) at bedtime 90 tablet 0 Past Week at Unknown time  . mirtazapine (REMERON) 7.5 MG tablet Take 1 tablet (7.5 mg total) by mouth at bedtime. 30 tablet 0 Past Week at Unknown time  . sertraline (ZOLOFT) 25 MG tablet  Take 3 tablets (75 mg total) by mouth daily. 90 tablet 0 Past Week at Unknown time  . nicotine (NICODERM CQ - DOSED IN MG/24 HOURS) 21 mg/24hr patch Place 1 patch (21 mg total) onto the skin daily. (Patient not taking: Reported on 08/15/2014) 28 patch 0 Not Taking at Unknown time    Previous Psychotropic Medications: Yes , HALDOL,TEGRETOL,LITHIUM,DEPAKOTE ( NOTHING WORKS EXCEPT KLONOPIN)  Substance Abuse History in the last 12 months:  Yes.  Cannabis , r/o Klonopin abuse    Consequences of Substance Abuse: Medical Consequences:  recent admission  Results for orders placed or performed during the hospital encounter of 08/15/14 (from the past 72 hour(s))  Acetaminophen level     Status: Abnormal   Collection Time: 08/15/14  8:37 PM  Result Value Ref Range   Acetaminophen (Tylenol), Serum <10.0 (L) 10 - 30 ug/mL    Comment:        THERAPEUTIC CONCENTRATIONS VARY SIGNIFICANTLY. A RANGE OF 10-30 ug/mL MAY BE AN EFFECTIVE CONCENTRATION FOR MANY PATIENTS.  HOWEVER, SOME ARE BEST TREATED AT CONCENTRATIONS OUTSIDE THIS RANGE. ACETAMINOPHEN CONCENTRATIONS >150 ug/mL AT 4 HOURS AFTER INGESTION AND >50 ug/mL AT 12 HOURS AFTER INGESTION ARE OFTEN ASSOCIATED WITH TOXIC REACTIONS.   CBC     Status: None   Collection Time: 08/15/14  8:37 PM  Result Value Ref Range   WBC 6.2 4.0 - 10.5 K/uL   RBC 4.99 4.22 - 5.81 MIL/uL   Hemoglobin 13.3 13.0 - 17.0 g/dL   HCT 40.7 39.0 - 52.0 %   MCV 81.6 78.0 - 100.0 fL   MCH 26.7 26.0 - 34.0 pg   MCHC 32.7 30.0 - 36.0 g/dL   RDW 15.2 11.5 - 15.5 %   Platelets 341 150 - 400 K/uL  Comprehensive metabolic panel     Status: Abnormal   Collection Time: 08/15/14  8:37 PM  Result Value Ref Range   Sodium 140 135 - 145 mmol/L   Potassium 4.1 3.5 - 5.1 mmol/L   Chloride 108 96 - 112 mmol/L   CO2 27 19 - 32 mmol/L   Glucose, Bld 100 (H) 70 - 99 mg/dL   BUN 10 6 - 23 mg/dL   Creatinine, Ser 0.85 0.50 - 1.35 mg/dL   Calcium 9.0 8.4 - 10.5 mg/dL   Total  Protein 7.5 6.0 - 8.3 g/dL   Albumin 3.7 3.5 - 5.2 g/dL   AST 27 0 - 37 U/L   ALT 19 0 - 53 U/L   Alkaline Phosphatase 81 39 - 117 U/L   Total Bilirubin 0.4 0.3 - 1.2 mg/dL   GFR calc non Af Amer >90 >90 mL/min   GFR calc Af Amer >90 >90 mL/min    Comment: (NOTE) The eGFR has been calculated using the CKD EPI equation. This calculation has not been validated in all clinical situations. eGFR's persistently <90 mL/min signify possible Chronic Kidney Disease.    Anion gap 5 5 - 15  Ethanol (ETOH)     Status: None   Collection Time: 08/15/14  8:37 PM  Result Value Ref Range   Alcohol, Ethyl (B) <5 0 - 9 mg/dL    Comment:        LOWEST DETECTABLE LIMIT FOR SERUM ALCOHOL IS 11 mg/dL FOR MEDICAL PURPOSES ONLY   Salicylate level     Status: None   Collection Time: 08/15/14  8:37 PM  Result Value Ref Range   Salicylate Lvl <3.8 2.8 - 20.0 mg/dL  Urine Drug Screen     Status: Abnormal   Collection Time: 08/15/14  8:45 PM  Result Value Ref Range   Opiates NONE DETECTED NONE DETECTED   Cocaine NONE DETECTED NONE DETECTED   Benzodiazepines NONE DETECTED NONE DETECTED   Amphetamines NONE DETECTED NONE DETECTED   Tetrahydrocannabinol POSITIVE (A) NONE DETECTED   Barbiturates NONE DETECTED NONE DETECTED    Comment:        DRUG SCREEN FOR MEDICAL PURPOSES ONLY.  IF CONFIRMATION IS NEEDED FOR ANY PURPOSE, NOTIFY LAB WITHIN 5 DAYS.        LOWEST DETECTABLE LIMITS FOR URINE DRUG SCREEN Drug Class       Cutoff (ng/mL) Amphetamine      1000 Barbiturate      200 Benzodiazepine   250 Tricyclics       539 Opiates          300 Cocaine          300 THC  50     Observation Level/Precautions:  15 minute checks  Laboratory:  per ED  Psychotherapy:  Group therapy  Medications:  As per medlist  Consultations:  As needed  Discharge Concerns:  Safety  Estimated LOS:  5-7 days  Other:     Psychological Evaluations: Yes   Treatment Plan Summary: Daily contact with patient  to assess and evaluate symptoms and progress in treatment and Medication management  Patient will benefit from inpatient treatment and stabilization.  Estimated length of stay is 5-7 days.  Reviewed past medical records,treatment plan.  Patient to be continued on Haldol 5 mg po bid with plan to titrate it higher. Patient declines any other medications. Per review of [place] controlled substance database - pt got Klonopin prescribed 0n 03/18/14 - by Pauline Good and then on 04/14/14 by Alemu Mengistu . Pt had refills from his previous script which he refilled after getting out the hospital during his last admission, inspite of being asked to stop it. Pt did not go to [facility] after being discharged from here. Pt has an upcoming appointment in April. Pt does not want to go to walk ins.  Patient wants to be discharged.     Medical Decision Making:  Review or order clinical lab tests (1), Discuss test with performing physician (1), Review and summation of old records (2), Review or order medicine tests (1), Review of Medication Regimen & Side Effects (2) and Review of New Medication or Change in Dosage (2)  I certify that inpatient services furnished can reasonably be expected to improve the patient's condition.   Jaleena Viviani 3/16/201611:00 AM  MD

## 2014-08-17 NOTE — ED Provider Notes (Signed)
CSN: [id number]     Arrival date & time 08/17/14  2324 History   First MD Initiated Contact with Patient 08/17/14 2331     No chief complaint on file.     HPI Patient requests detox from alcohol.  He states his last drink was 36 hours ago.  He was recently discharged from behavioral health earlier today for suicidal thoughts.  He is been stabilized from mental health standpoint.  He denies shaking or tremors.  No other complaints.  Denies homicidal or suicidal thoughts at this time.   Past Medical History  Diagnosis Date  . Depression   . Schizophrenia   . Bipolar 1 disorder    No past surgical history on file. No family history on file. History  Substance Use Topics  . Smoking status: Current Every Day Smoker -- 1.00 packs/day    Types: Cigarettes  . Smokeless tobacco: Not on file  . Alcohol Use: No     Comment: Denies ETOH    Review of Systems  All other systems reviewed and are negative.     Allergies  Salami  Home Medications   Prior to Admission medications   Medication Sig Start Date End Date Taking? Authorizing Provider  benztropine (COGENTIN) 0.5 MG tablet Take 1 tablet (0.5 mg total) by mouth 2 (two) times daily. 08/17/14   Adonis BrookSheila Agustin, NP  haloperidol (HALDOL) 5 MG tablet Take 1 tablet (5 mg total) by mouth 2 (two) times daily. 08/17/14   Adonis BrookSheila Agustin, NP   There were no vitals taken for this visit. Physical Exam  Constitutional: He is oriented to person, place, and time. He appears well-developed and well-nourished.  HENT:  Head: Normocephalic and atraumatic.  Eyes: EOM are normal.  Neck: Normal range of motion.  Cardiovascular: Normal rate, regular rhythm, normal heart sounds and intact distal pulses.   Pulmonary/Chest: Effort normal and breath sounds normal. No respiratory distress.  Abdominal: Soft. He exhibits no distension. There is no tenderness.  Musculoskeletal: Normal range of motion.  Neurological: He is alert and oriented to person,  place, and time.  Skin: Skin is warm and dry.  Psychiatric: He has a normal mood and affect. Judgment normal.  Nursing note and vitals reviewed.   ED Course  Procedures (including critical care time) Labs Review Labs Reviewed - No data to display  Imaging Review No results found.   EKG Interpretation None      MDM   Final diagnoses:  None    pt is overall well-appearing.  Resource guide given.  Outpatient management of his substance abuse.    Azalia BilisKevin Christino Mcglinchey, MD 08/18/14 [phone]

## 2014-08-17 NOTE — Progress Notes (Signed)
  [facility] Dis[facility] :  Will you be returning to the same living situation after discharge:  Yes,  home At discharge, do you have transportation home?: Yes,  bus pass Do you have the ability to pay for your medications: Yes,  MCD  Release of information consent forms completed and in the chart;  Patient's signature needed at discharge.  Patient to Follow up at: Follow-up Information    Follow up with [facility] On 09/07/2014.   Why:  Wednesday at 2:30.  I was unable to get you a sooner appointment.  Should you need to be seen sooner than that, go to the walk-in clinic M-F between 8 and 10 AM.   Contact information:   [address]  [place]  [phone]      Patient denies SI/HI: Yes,  yes    Safety Planning and Suicide Prevention discussed: Yes,  yes  Have you used any form of tobacco in the last 30 days? (Cigarettes, Smokeless Tobacco, Cigars, and/or Pipes): Yes  Has patient been referred to the Quitline?: Patient refused referral today, but was referred a month ago when he was here.  Ronald Martinez, Ronald Martinez B 08/17/2014, 11:07 AM

## 2014-08-17 NOTE — Plan of Care (Signed)
Problem: Diagnosis: Increased Risk For Suicide Attempt Goal: STG-Patient Will Report Suicidal Feelings to Staff Outcome: Progressing Pt reported SI without a plan this evening.  He verbally contracted for safety.   Goal: STG-Patient Will Comply With Medication Regime Outcome: Progressing Pt has been compliant with PRN medications this shift.

## 2014-08-17 NOTE — Progress Notes (Addendum)
D: Pt has anxious, irritable, preoccupied affect.  Pt's mood is depressed, anxious, irritable.  Pt reports he is "hanging in there."  Pt reports "today has been a lot of aggravation and stress going on, you know trying to be good to friends and family members and I'm always getting the rear end of that, then the suicidal thoughts.  Even though my grandmama passed away years ago, I've still been grieving with that."  Pt reports "one of my best friends in the world, he's dead.  This other lady died who looked out for me."  Pt describes his mood as "extremely, extremely, extremely upset and angry."  Pt reports SI, denies current plan.  Pt verbally contracts for safety.  Pt denies HI, denies hallucinations currently.  Pt reports he was having auditory hallucinations earlier in the day, stating "I was hearing some voices."  Pt has stayed in his room for the majority of the shift.  He did not attend evening group. A: Introduced self to pt and met with pt 1:1.  Actively listened to pt.  Provided support and encouragement.  On-call provider notified that pt was agitated and pt was started on agitation protocol.  Pt received PRN medications for pain, anxiety and agitation, see flow sheet.   R: Pt was compliant with PRN medication.  He verbally contracted for safety and reported that he will notify staff of needs and concerns.  Will continue to monitor and assess.

## 2014-08-17 NOTE — [facility] Suicide Risk Assessment (Signed)
[facility] INPATIENT:  Family/Significant Other Suicide Prevention Education  Suicide Prevention Education:  Patient Refusal for Family/Significant Other Suicide Prevention Education: The patient Ronald Martinez has refused to provide written consent for family/significant other to be provided Family/Significant Other Suicide Prevention Education during admission and/or prior to discharge.  Physician notified.  Daryel Geraldorth, Willene Holian B 08/17/2014, 10:54 AM

## 2014-08-17 NOTE — Discharge Summary (Signed)
Physician Discharge Summary Note  Patient:  Ronald Martinez is an 34 y.o., male MRN:  [id number] DOB:  18-Mar-1981 Patient phone:  [phone] (home)  Patient address:   [place] [address],  Total Time spent with patient: 30 minutes  Date of Admission:  08/16/2014 Date of Discharge: 08/17/2014  Reason for Admission:    Principal Problem: Schizoaffective disorder, bipolar type Discharge Diagnoses: Patient Active Problem List   Diagnosis Date Noted  . Cannabis use disorder, severe, dependence [F12.20] 08/17/2014  . Schizoaffective disorder, bipolar type [F25.0] 08/16/2014    Musculoskeletal: Strength & Muscle Tone: within normal limits Gait & Station: normal Patient leans: N/A  Psychiatric Specialty Exam:  SEE SRA Physical Exam  Vitals reviewed. Psychiatric: His behavior is normal.    Review of Systems  Psychiatric/Behavioral: The patient is nervous/anxious.   All other systems reviewed and are negative.   Blood pressure 114/81, pulse 95, temperature 97.7 F (36.5 C), temperature source Oral, resp. rate 18, height 5' 11.5" (1.816 m), weight 89.585 kg (197 lb 8 oz), SpO2 100 %.Body mass index is 27.16 kg/(m^2).   Past Medical History:  Past Medical History  Diagnosis Date  . Depression   . Schizophrenia   . Bipolar 1 disorder    History reviewed. No pertinent past surgical history. Family History: History reviewed. No pertinent family history. Social History:  History  Alcohol Use No    Comment: Denies ETOH     History  Drug Use  . Yes  . Special: Marijuana, Benzodiazepines    History   Social History  . Marital Status: Single    Spouse Name: N/A  . Number of Children: N/A  . Years of Education: N/A   Social History Main Topics  . Smoking status: Current Every Day Smoker -- 1.00 packs/day    Types: Cigarettes  . Smokeless tobacco: Not on file  . Alcohol Use: No     Comment: Denies ETOH  . Drug Use: Yes    Special: Marijuana,  Benzodiazepines  . Sexual Activity: Not on file   Other Topics Concern  . None   Social History Narrative   Risk to Self: Is patient at risk for suicide?: Yes Risk to Others:   Prior Inpatient Therapy:   Prior Outpatient Therapy:    Level of Care:  OP  Hospital Course:  Cree Kunert is an 56 y.o. [place] ,single , on SSD, lives with a friend in [place] . Per initial notes in EHR , based on initial evaluation ; "Patient is more than likely malingering. He has been in the emergency rooms before and had similar complaints of SI and hallucinations. Patient chooses not to go to [facility] and when he does he does not stay for walk-in clinic."   Patient focused on getting back on Klonopin, did not want to discuss any other treatment options. When other medications were discussed pt closed his eyes, turned his head to the side and got verbally abusive . Pt reported that Klonopin was prescribed to him by his outpatient psychiatrist. However patient was here on 07/16/14 and at that time was taken off of his klonopin and was started on Haldol, zoloft and Remeron ( per pt request) . Per review of [place] controlled substance database - pt got Klonopin prescribed 0n 03/18/14 - by Pauline Good and then on 04/14/14 by Alemu Mengistu . Pt had refills from his previous script which he refilled after getting out the hospital during his last admission, inspite  of being asked to stop it. Pt did not go to [facility] after being discharged from here. Pt has an upcoming appointment in April. Pt does not want to go to walk ins. Pt is focussed only on Klonopin , became verbally abusive , calling "Bitch , go back to where you came from ", when he was asked to consider other options. Pt wants to be discharged home.   Raynor Calcaterra was admitted for Schizoaffective disorder, bipolar type and crisis management.  He was treated discharged with the medications listed below under Medication List.  Medical problems were identified and  treated as needed.  Home medications were restarted as appropriate.  Improvement was monitored by observation and Valerie Salts daily report of symptom reduction.  Emotional and mental status was monitored by daily self-inventory reports completed by Valerie Salts and clinical staff.         Maksym Pfiffner was evaluated by the treatment team for stability and plans for continued recovery upon discharge.  Ramond Darnell motivation was an integral factor for scheduling further treatment.  Employment, transportation, bed availability, health status, family support, and any pending legal issues were also considered during his hospital stay.  He was offered further treatment options upon discharge including but not limited to Residential, Intensive Outpatient, and Outpatient treatment.  Joquan Lotz will follow up with the services as listed below under Follow Up Information.     Upon completion of this admission the patient was both mentally and medically stable for discharge denying suicidal/homicidal ideation, auditory/visual/tactile hallucinations, delusional thoughts and paranoia.      Consults:  psychiatry  Significant Diagnostic Studies:  labs: per ED  Discharge Vitals:   Blood pressure 114/81, pulse 95, temperature 97.7 F (36.5 C), temperature source Oral, resp. rate 18, height 5' 11.5" (1.816 m), weight 89.585 kg (197 lb 8 oz), SpO2 100 %. Body mass index is 27.16 kg/(m^2). Lab Results:   Results for orders placed or performed during the hospital encounter of 08/15/14 (from the past 72 hour(s))  Acetaminophen level     Status: Abnormal   Collection Time: 08/15/14  8:37 PM  Result Value Ref Range   Acetaminophen (Tylenol), Serum <10.0 (L) 10 - 30 ug/mL    Comment:        THERAPEUTIC CONCENTRATIONS VARY SIGNIFICANTLY. A RANGE OF 10-30 ug/mL MAY BE AN EFFECTIVE CONCENTRATION FOR MANY PATIENTS. HOWEVER, SOME ARE BEST TREATED AT CONCENTRATIONS OUTSIDE THIS RANGE. ACETAMINOPHEN  CONCENTRATIONS >150 ug/mL AT 4 HOURS AFTER INGESTION AND >50 ug/mL AT 12 HOURS AFTER INGESTION ARE OFTEN ASSOCIATED WITH TOXIC REACTIONS.   CBC     Status: None   Collection Time: 08/15/14  8:37 PM  Result Value Ref Range   WBC 6.2 4.0 - 10.5 K/uL   RBC 4.99 4.22 - 5.81 MIL/uL   Hemoglobin 13.3 13.0 - 17.0 g/dL   HCT 40.7 39.0 - 52.0 %   MCV 81.6 78.0 - 100.0 fL   MCH 26.7 26.0 - 34.0 pg   MCHC 32.7 30.0 - 36.0 g/dL   RDW 15.2 11.5 - 15.5 %   Platelets 341 150 - 400 K/uL  Comprehensive metabolic panel     Status: Abnormal   Collection Time: 08/15/14  8:37 PM  Result Value Ref Range   Sodium 140 135 - 145 mmol/L   Potassium 4.1 3.5 - 5.1 mmol/L   Chloride 108 96 - 112 mmol/L   CO2 27 19 - 32 mmol/L   Glucose, Bld 100 (H) 70 - 99 mg/dL  BUN 10 6 - 23 mg/dL   Creatinine, Ser 0.85 0.50 - 1.35 mg/dL   Calcium 9.0 8.4 - 10.5 mg/dL   Total Protein 7.5 6.0 - 8.3 g/dL   Albumin 3.7 3.5 - 5.2 g/dL   AST 27 0 - 37 U/L   ALT 19 0 - 53 U/L   Alkaline Phosphatase 81 39 - 117 U/L   Total Bilirubin 0.4 0.3 - 1.2 mg/dL   GFR calc non Af Amer >90 >90 mL/min   GFR calc Af Amer >90 >90 mL/min    Comment: (NOTE) The eGFR has been calculated using the CKD EPI equation. This calculation has not been validated in all clinical situations. eGFR's persistently <90 mL/min signify possible Chronic Kidney Disease.    Anion gap 5 5 - 15  Ethanol (ETOH)     Status: None   Collection Time: 08/15/14  8:37 PM  Result Value Ref Range   Alcohol, Ethyl (B) <5 0 - 9 mg/dL    Comment:        LOWEST DETECTABLE LIMIT FOR SERUM ALCOHOL IS 11 mg/dL FOR MEDICAL PURPOSES ONLY   Salicylate level     Status: None   Collection Time: 08/15/14  8:37 PM  Result Value Ref Range   Salicylate Lvl <1.7 2.8 - 20.0 mg/dL  Urine Drug Screen     Status: Abnormal   Collection Time: 08/15/14  8:45 PM  Result Value Ref Range   Opiates NONE DETECTED NONE DETECTED   Cocaine NONE DETECTED NONE DETECTED    Benzodiazepines NONE DETECTED NONE DETECTED   Amphetamines NONE DETECTED NONE DETECTED   Tetrahydrocannabinol POSITIVE (A) NONE DETECTED   Barbiturates NONE DETECTED NONE DETECTED    Comment:        DRUG SCREEN FOR MEDICAL PURPOSES ONLY.  IF CONFIRMATION IS NEEDED FOR ANY PURPOSE, NOTIFY LAB WITHIN 5 DAYS.        LOWEST DETECTABLE LIMITS FOR URINE DRUG SCREEN Drug Class       Cutoff (ng/mL) Amphetamine      1000 Barbiturate      200 Benzodiazepine   510 Tricyclics       258 Opiates          300 Cocaine          300 THC              50     Physical Findings: AIMS: Facial and Oral Movements Muscles of Facial Expression: None, normal Lips and Perioral Area: None, normal Jaw: None, normal Tongue: None, normal,Extremity Movements Upper (arms, wrists, hands, fingers): None, normal Lower (legs, knees, ankles, toes): None, normal, Trunk Movements Neck, shoulders, hips: None, normal, Overall Severity Severity of abnormal movements (highest score from questions above): None, normal Incapacitation due to abnormal movements: None, normal Patient's awareness of abnormal movements (rate only patient's report): No Awareness, Dental Status Current problems with teeth and/or dentures?: No Does patient usually wear dentures?: No  CIWA:    COWS:      See Psychiatric Specialty Exam and Suicide Risk Assessment completed by Attending Physician prior to discharge.  Discharge destination:  Home  Is patient on multiple antipsychotic therapies at discharge:  No   Has Patient had three or more failed trials of antipsychotic monotherapy by history:  No    Recommended Plan for Multiple Antipsychotic Therapies: NA     Medication List    STOP taking these medications        gabapentin 300 MG capsule  Commonly known  as:  NEURONTIN     mirtazapine 7.5 MG tablet  Commonly known as:  REMERON     nicotine 21 mg/24hr patch  Commonly known as:  NICODERM CQ - dosed in mg/24 hours      sertraline 25 MG tablet  Commonly known as:  ZOLOFT      TAKE these medications      Indication   benztropine 0.5 MG tablet  Commonly known as:  COGENTIN  Take 1 tablet (0.5 mg total) by mouth 2 (two) times daily.   Indication:  Extrapyramidal Reaction caused by Medications     haloperidol 5 MG tablet  Commonly known as:  HALDOL  Take 1 tablet (5 mg total) by mouth 2 (two) times daily.   Indication:  Mood Stabilization           Follow-up Information    Follow up with [facility] On 09/07/2014.   Why:  Wednesday at 2:30.  I was unable to get you a sooner appointment.  Should you need to be seen sooner than that, go to the walk-in clinic M-F between 8 and 10 AM.   Contact information:   [place] [phone]      Follow-up recommendations:  Activity:  as tol, diet as tol  Comments:  1.  Take all your medications as prescribed.              2.  Report any adverse side effects to outpatient provider.                       3.  Patient instructed to not use alcohol or illegal drugs while on prescription medicines.            4.  In the event of worsening symptoms, instructed patient to call 911, the crisis hotline or go to nearest emergency room for evaluation of symptoms.  Total Discharge Time:  30 min  Signed: Kerrie Buffalo MAY, AGNP-BC 08/17/2014, 3:41 PM

## 2014-08-17 NOTE — BHH Counselor (Signed)
Adult Psychosocial Assessment Update Interdisciplinary Team  [facility]  Admissions Discharges  Date: 07/16/14 Date:  Date: Date:  Date: Date:  Date: Date:  Date: Date:   Changes since the last Psychosocial Assessment (including adherence to outpatient mental health and/or substance abuse treatment, situational issues contributing to decompensation and/or relapse). Ronald Martinez was discharged less than 24 hours after admission.  Unable to complete PSA.             Discharge Plan 1. Will you be returning to the same living situation after discharge?   Yes: No:      If no, what is your plan?           2. Would you like a referral for services when you are discharged? Yes:     If yes, for what services?  No:              Summary and Recommendations (to be completed by the evaluator)                        Signature:  Ronald Martinez, Ronald Martinez B, 08/17/2014 11:06 AM

## 2014-08-17 NOTE — [facility] Suicide Risk Assessment (Signed)
[facility]   Nursing information obtained from:    Demographic factors:    Current Mental Status:    Loss Factors:    Historical Factors:    Risk Reduction Factors:    Total Time spent with patient: 30 minutes Principal Problem: Schizoaffective disorder, bipolar type Diagnosis:   Patient Active Problem List   Diagnosis Date Noted  . Cannabis use disorder, severe, dependence [F12.20] 08/17/2014  . Schizoaffective disorder, bipolar type [F25.0] 08/16/2014     Continued Clinical Symptoms:  Alcohol Use Disorder Identification Test Final Score (AUDIT): 0 The "Alcohol Use Disorders Identification Test", Guidelines for Use in Primary Care, Second Edition.  World Science writerHealth [employer] [facility](WHO). Score between 0-7:  no or low risk or alcohol related problems. Score between 8-15:  moderate risk of alcohol related problems. Score between 16-19:  high risk of alcohol related problems. Score 20 or above:  warrants further diagnostic evaluation for alcohol dependence and treatment.   CLINICAL FACTORS:   Alcohol/Substance Abuse/Dependencies Unstable or Poor Therapeutic Relationship Previous Psychiatric Diagnoses and Treatments   Musculoskeletal: Strength & Muscle Tone: within normal limits Gait & Station: normal Patient leans: N/A  Psychiatric Specialty Exam: Physical Exam  Review of Systems  Psychiatric/Behavioral: Positive for hallucinations and substance abuse. Negative for depression and suicidal ideas. The patient is not nervous/anxious and does not have insomnia.     Blood pressure 114/81, pulse 95, temperature 97.7 F (36.5 C), temperature source Oral, resp. rate 18, height 5' 11.5" (1.816 m), weight 89.585 kg (197 lb 8 oz), SpO2 100 %.Body mass index is 27.16 kg/(m^2).   Please see H&P for MSE.      SUICIDE RISK:   Minimal: No identifiable suicidal ideation.  Patients presenting with no risk factors but with morbid ruminations; may be classified as  minimal risk based on the severity of the depressive symptoms  PLAN OF CARE: Please see H&P.   Medical Decision Making:  Review of Psycho-Social Stressors (1), Review or order clinical lab tests (1), Review and summation of old records (2), Review of Last Therapy Session (1), Review of Medication Regimen & Side Effects (2) and Review of New Medication or Change in Dosage (2)  I certify that inpatient services furnished can reasonably be expected to improve the patient's condition.   Caedin Mogan md 08/17/2014, 10:30 AM

## 2014-08-17 NOTE — Tx Team (Signed)
  Interdisciplinary Treatment Plan Update   Date Reviewed:  08/17/2014  Time Reviewed:  11:01 AM  Progress in Treatment:   Attending groups:No Participating in groups: No Taking medication as prescribed: Yes  Tolerating medication: Yes Family/Significant other contact made: No Patient understands diagnosis: No  Focused on getting klonopin Discussing patient identified problems/goals with staff: Yes  See initial care plan Medical problems stabilized or resolved: Yes Denies suicidal/homicidal ideation: Yes  In tx team Patient has not harmed self or others: Yes  For review of initial/current patient goals, please see plan of care.  Estimated Length of Stay:  D/C today  Reason for Continuation of Hospitalization:   New Problems/Goals identified:  N/A  Discharge Plan or Barriers: return home, follow up outpt    Additional Comments:  Iona HansenKeven was told he would not be prescribed Klonopin here.  He became loud, demanding and aggressive after that.  Disruptive to the milieu.  When given the option, he chose to be discharged today since it was made clear to him that the Dr would not give in to his demands.  Attendees:  Signature: Ivin BootySarama Eappen, MD 08/17/2014 11:01 AM   Signature: Richelle Itood Muriel Wilber, LCSW 08/17/2014 11:01 AM  Signature:  08/17/2014 11:01 AM  Signature: Lendell CapriceBrittany Guthrie, RN 08/17/2014 11:01 AM  Signature:  08/17/2014 11:01 AM  Signature:  08/17/2014 11:01 AM  Signature:   08/17/2014 11:01 AM  Signature:    Signature:    Signature:    Signature:    Signature:    Signature:      Scribe for Treatment Team:   [employer] Nyheim Seufert, LCSW  08/17/2014 11:01 AM

## 2014-08-17 NOTE — Discharge Instructions (Signed)
°Emergency Department Resource Guide °1) Find a Doctor and Pay Out of Pocket °Although you won't have to find out who is covered by your insurance plan, it is a good idea to ask around and get recommendations. You will then need to call the office and see if the doctor you have chosen will accept you as a new patient and what types of options they offer for patients who are self-pay. Some doctors offer discounts or will set up payment plans for their patients who do not have insurance, but you will need to ask so you aren't surprised when you get to your appointment. ° °2) Contact Your Local Health Department °Not all health departments have doctors that can see patients for sick visits, but many do, so it is worth a call to see if yours does. If you don't know where your local health department is, you can check in your phone book. The CDC also has a tool to help you locate your state's health department, and many state websites also have listings of all of their local health departments. ° °3) Find a Walk-in Clinic °If your illness is not likely to be very severe or complicated, you may want to try a walk in clinic. These are popping up all over the country in pharmacies, drugstores, and shopping centers. They're usually staffed by nurse practitioners or physician assistants that have been trained to treat common illnesses and complaints. They're usually fairly quick and inexpensive. However, if you have serious medical issues or chronic medical problems, these are probably not your best option. ° °No Primary Care Doctor: °- Call [facility] at  832-8000 - they can help you locate a primary care doctor that  accepts your insurance, provides certain services, etc. °- Physician Referral Service- 1-800-533-3463 ° °Chronic Pain Problems: °Organization         Address  Phone   Notes  °[facility]  (336) 297-2271 Patients need to be referred by their primary care doctor.  ° °Medication  Assistance: °Organization         Address  Phone   Notes  °[place] [facility] [address] °[place], [place] [address] (336) 641-8030 --Must be a resident of [place] °-- Must have NO insurance coverage whatsoever (no Medicaid/ Medicare, etc.) °-- The pt. MUST have a primary care doctor that directs their care regularly and follows them in the community °  °[facility]  (866) 331-1348   °[employer]  (888) 892-1162   ° °Agencies that provide inexpensive medical care: °Organization         Address  Phone   Notes  °[facility]  (336) 832-8035   °[facility]    (336) 832-7272   °[facility] [address] °[place], [place] [address] (336) 832-4777   °[facility] [address], °[place] (336) 271-4999   °[facility]    (336) 373-0678   °[facility]    (336) 272-1050   °[facility] ° [address], [place] Phone:  (336) 832-4444, Fax:  (336) 832-4440 Hours of Operation:  9 am - 6 pm, M-F.  Also accepts Medicaid/Medicare and self-pay.  °[facility] ° [address], [place] Phone: (336) 832-3150, Fax: (336) [phone]. Hours of Operation:  8:30 am - 5:30 pm, M-F.  Also accepts Medicaid and self-pay.  °[facility] [place] [address], [place] Phone: (336) 878-6027   °[facility] [address], [place], [place] (336)723-1848, Ext. 123 Mondays & Thursdays: 7-9 AM.  First 15 patients are seen on a first come, first serve basis. °  ° °Medicaid-accepting [place] Providers: ° °Organization         Address  Phone   Notes  °[facility] [address], [place] (336) 641-2100 Also accepts self-pay patients.  °[facility] [address], [place] ° (336) 856-9996   °[facility] [address], [place]  (336) 288-8857   °[facility] [address] [place] [address], [place] (336) 299-7000   °Veita Bland [address], [place]  ° (336) 373-1557 Only accepts [place] Access Medicaid patients after they have their name applied to their card.  ° °Self-Pay (no insurance) in [place]: ° °Organization         Address  Phone   Notes  °Sickle Cell Patients, [facility] [address], [place] (336) 832-1970   °[facility] [address], [place] (336) 832-4400   °[facility] ° [address], [place] (336) 992-4800   °[facility]/Dr. Osei-Bonsu ° [address] [place] [address], [place] or [address], [place] (336) 841-8500 Phone number for both [place] and [place] locations is the same.  °[facility] [address], [place] (336) 299-0000   °[facility] [address] [place] [address], [place] or [address] (336) 852-7530 °(336) 878-2260   °[facility] [address], [place] (336) 350-1642, phone; (336) 294-5005, fax Sees patients 1st and 3rd Saturday of every month.  Must not qualify for public or private insurance (i.e. Medicaid, Medicare, [place] Health Choice, Veterans' Benefits) • Household income should be no more than 200% of the poverty level •The clinic cannot treat you if you are pregnant or think you are pregnant • Sexually transmitted diseases are not treated at the clinic.  ° ° °Dental Care: °Organization         Address  Phone  Notes  °[place] [facility] [address], [place] (336) 641-6152 Accepts children up to age 21 who are enrolled in Medicaid or [place] Health Choice; pregnant women with a Medicaid card; and children who have applied for Medicaid or [place] Health Choice, but were declined, whose parents can pay a reduced fee at time of service.  °[place]  [facility] [place]  [address], [place] (336) 641-7733 Accepts children up to age 21 who are enrolled in Medicaid or [place] Health Choice; pregnant women with a Medicaid card; and children who have applied for Medicaid or  Health Choice, but were declined, whose parents can pay a reduced fee at time of service.  °[facility] ° [address], [place] (336) 641-4533 Patients are seen by appointment only. Walk-ins are not accepted. [facility] will see patients 18 years of age and older. °Monday - Tuesday (8am-5pm) °Most Wednesdays (8:30-5pm) °$30 per visit, cash only  °[facility] ° [address], [place] (336) 641-4533 Patients are seen by appointment only. Walk-ins are not accepted. [facility] will see patients 18 years of age and older. °One   Wednesday Evening (Monthly: Volunteer Based).  $30 per visit, cash only  °[facility]  (919) 537-3737 for adults; Children under age 4, call Graduate Pediatric Dentistry at (919) [phone]. Children aged 4-14, please call (919) [phone] to request a pediatric application. ° Dental services are provided in all areas of dental care including fillings, crowns and bridges, complete and partial dentures, implants, gum treatment, root canals, and extractions. Preventive care is also provided. Treatment is provided to both adults and children. °Patients are selected via a lottery and there is often a waiting list. °  °[facility] [address], °[place] ° (336) 763-8833 [email or web address] °  °[facility] [address], [place], [place] (336)723-1848, Ext. 123 Second and Fourth Thursday of each month, opens at 6:30 AM; Clinic ends at 9 AM.  Patients are seen on a first-come first-served basis, and a limited number are seen during each clinic.  ° °[facility] ° [address], [place], [place] (336) 723-7904    Eligibility Requirements °You must have lived in [place], [place], or [place] counties for at least the last three months. °  You cannot be eligible for state or federal sponsored healthcare insurance, including Veterans Administration, Medicaid, or Medicare. °  You generally cannot be eligible for healthcare insurance through your employer.  °  How to apply: °Eligibility screenings are held every Tuesday and Wednesday afternoon from 1:00 pm until 4:00 pm. You do not need an appointment for the interview!  °[facility] [address], [place], [place] 336-631-2330   °[facility]  336-342-8273   °[facility]  336-703-3100   °[facility]  336-570-6415   ° °Behavioral Health Resources in the Community: °Intensive Outpatient Programs °Organization         Address  Phone  Notes  °[facility] [address], [place], [place] 336-878-6098   °[facility] [address], [place], [place] 336-832-9800   °[facility] [address], [place], [place] ° 336-882-2125   °[facility] [address],  °[place], [place] 1-800-853-5163 or 336-641-4981   °Substance Abuse Resources °Organization         Address  Phone  Notes  °[facility]  336-882-2125   °[facility]  336-784-9470   °[facility]  336-285-9073   °[facility]  336-845-3988   °Residential & Outpatient Substance Abuse Program  1-800-659-3381   °Psychological Services °Organization         Address  Phone  Notes  °[facility]  336- 832-9600   °[facility]  336- 378-7881   °[facility] [address], [place] 1-800-853-5163 or 336-641-4981   ° °Mobile Crisis Teams °Organization         Address  Phone  Notes  °[facility], Mobile Crisis Care Unit  1-877-626-1772   °Assertive °[facility] ° [address]  [place], [place] 336-834-9664   °Sharon DeEsch [address] °[place] 336-554-5454   ° °Self-Help/Support Groups °Organization         Address  Phone             Notes  °[facility] - variety of support groups  336- 373-1402 Call for more information  °Narcotics Anonymous (NA), [facility] [address], °[place]  2 meetings at this location  ° °  Residential Treatment Programs °Organization         Address  Phone  Notes  °[facility] [address],    °[place]  1-866-801-8205   °[facility] ° [address], [place], [place] 704-293-8524   °[facility] [address], [place] 336-845-3988 Admissions: 8am-3pm M-F  °[facility] [address]    °[place], [place] 336-841-1104   °[facility] [address], [place], [place] 336-379-7146   °[facility] [address]  °[place], [place] 336-285-9073   °[facility] - Intensive Outpatient [address], [place], [place] 336-852-3033   °[facility] [address]  °[place], [place] 1-877-615-2722 or 336-784-9470   °[facility] [address] [place], [place] 336-227-7417 Accepts Medicaid  °[facility] [address]  °[place] 1-800-659-3381 Substance Abuse/Addiction Treatment  ° °[place] Behavioral Health Resources °Organization         Address  Phone  Notes  °[facility]  (888) 581-9988   °Julie Brannon, PhD [address] [place], [place]   (336) 349-5553 or (336) 951-0000   °[facility]   [address] °[place], [place] (336) 349-4454   °[facility] [address], [place], [place] (336) 342-8316 Insurance/Medicaid/sponsorship through [facility]  °[facility] [address]                                    [place], [place] (336) 342-8316 Therapy/tele-psych/case    °[facility] [address]  ° [place], [place] (336) 349-2233    °Dr. Arfeen  (336) 349-4544   °[facility] [place]  [employer] [place] [facility] 1) [address], [place] °2) [address], [place] °3)  [address], [place] (336) 349-3220 °(336) 342-7768 ° °(336) 342-8140   °[place] [facility] (336) 342-1394 or (336) 342-3537 (After Hours)    ° ° °

## 2014-08-17 NOTE — BHH Suicide Risk Assessment (Signed)
[facility] Discharge Suicide Risk Assessment   Demographic Factors:  Male  Total Time spent with patient: 30 minutes  Musculoskeletal: Strength & Muscle Tone: within normal limits Gait & Station: normal Patient leans: N/A  Psychiatric Specialty Exam: Physical Exam  Review of Systems  Psychiatric/Behavioral: Positive for substance abuse (cananbis , likely BZD). Negative for depression, suicidal ideas and hallucinations. The patient is not nervous/anxious and does not have insomnia.     Blood pressure 114/81, pulse 95, temperature 97.7 F (36.5 C), temperature source Oral, resp. rate 18, height 5' 11.5" (1.816 m), weight 89.585 kg (197 lb 8 oz), SpO2 100 %.Body mass index is 27.16 kg/(m^2).  General Appearance: Casual  Eye Contact::  Good  Speech:  Clear and Coherent409  Volume:  Normal  Mood:  Irritable , patient is upset about not being on Klonopin, wants to be discharged if not on Klonopin  Affect:  Labile  Thought Process:  Coherent  Orientation:  Full (Time, Place, and Person)  Thought Content:  Hallucinations: Auditory Pt states having AH ,but is more focussed on getting klonopin prescribed and is not cooperative with any other treatment options.  Suicidal Thoughts:  No  Homicidal Thoughts:  No  Memory:  Immediate;   Fair Recent;   Fair Remote;   Fair  Judgement:  Impaired  Insight:  Shallow  Psychomotor Activity:  Restlessness  Concentration:  Fair  Recall:  [employer] of Knowledge:Fair  Language: Fair  Akathisia:  No  Handed:  Right  AIMS (if indicated):     Assets:  Communication Skills Desire for Improvement  Sleep:  Number of Hours: 5.75  Cognition: WNL  ADL's:  Intact   Have you used any form of tobacco in the last 30 days? (Cigarettes, Smokeless Tobacco, Cigars, and/or Pipes): Yes  Has this patient used any form of tobacco in the last 30 days? (Cigarettes, Smokeless Tobacco, Cigars, and/or Pipes) Yes, A prescription for an FDA-approved tobacco cessation  medication was offered at discharge and the patient refused  Mental Status Per Nursing Assessment::   On Admission:     Current Mental Status by Physician: patient is verbally abusive to writer saying "bitch' you need to give me my Klonopin , go back to medical school, why can't you prescribe me what I want , I do not want anything else but Klonopin. Patient was given the option of starting other medications to figure out what works for him and also asked about withdrawal from Klonopin, but patient is uncooperative , refusing to answer questions , but getting agitated and wants to be discharged. Pt denied SI/HI/VH.   Loss Factors: NA  Historical Factors: Impulsivity  Risk Reduction Factors:   Living with another person, especially a relative  Continued Clinical Symptoms:  Alcohol/Substance Abuse/Dependencies Unstable or Poor Therapeutic Relationship Previous Psychiatric Diagnoses and Treatments  Cognitive Features That Contribute To Risk:  Closed-mindedness, Polarized thinking and Thought constriction (tunnel vision)    Suicide Risk:  Minimal: No identifiable suicidal ideation.  Patients presenting with no risk factors but with morbid ruminations; may be classified as minimal risk based on the severity of the depressive symptoms  Principal Problem: Schizoaffective disorder, bipolar type Discharge Diagnoses:  Patient Active Problem List   Diagnosis Date Noted  . Cannabis use disorder, severe, dependence [F12.20] 08/17/2014  . Schizoaffective disorder, bipolar type [F25.0] 08/16/2014      Plan Of Care/Follow-up recommendations:  Activity:  no restrictions Diet:  regular Tests:  as needed Other:  follow up with after  care  Is patient on multiple antipsychotic therapies at discharge:  No   Has Patient had three or more failed trials of antipsychotic monotherapy by history:  No  Recommended Plan for Multiple Antipsychotic Therapies: NA    Isabellah Sobocinski MD 08/17/2014,  10:28 AM

## 2014-08-22 NOTE — Progress Notes (Signed)
Patient Discharge Instructions:  After Visit Summary (AVS):   Faxed to:  08/22/14 Discharge Summary Note:   Faxed to:  08/22/14 Psychiatric Admission Assessment Note:   Faxed to:  08/22/14 Suicide Risk Assessment - Discharge Assessment:   Faxed to:  08/22/14 Faxed/Sent to the Next Level Care provider:  08/22/14 [facility] @[phone]  Jerelene ReddenSheena E La Cueva, 08/22/2014, 3:07 PM

## 2014-09-13 ENCOUNTER — Emergency Department (HOSPITAL_COMMUNITY)
Admission: EM | Admit: 2014-09-13 | Discharge: 2014-09-13 | Disposition: A | Discharge status: Home / Self Care | Payer: Medicaid Other | Attending: Emergency Medicine | Admitting: Emergency Medicine

## 2014-09-13 ENCOUNTER — Encounter (HOSPITAL_COMMUNITY): Payer: Self-pay | Admitting: *Deleted

## 2014-09-13 ENCOUNTER — Emergency Department (HOSPITAL_COMMUNITY): Payer: Medicaid Other

## 2014-09-13 DIAGNOSIS — F319 Bipolar disorder, unspecified: Secondary | ICD-10-CM | POA: Diagnosis not present

## 2014-09-13 DIAGNOSIS — F209 Schizophrenia, unspecified: Secondary | ICD-10-CM | POA: Insufficient documentation

## 2014-09-13 DIAGNOSIS — Z79899 Other long term (current) drug therapy: Secondary | ICD-10-CM | POA: Insufficient documentation

## 2014-09-13 DIAGNOSIS — Z7952 Long term (current) use of systemic steroids: Secondary | ICD-10-CM | POA: Insufficient documentation

## 2014-09-13 DIAGNOSIS — R059 Cough, unspecified: Secondary | ICD-10-CM

## 2014-09-13 DIAGNOSIS — J45901 Unspecified asthma with (acute) exacerbation: Secondary | ICD-10-CM | POA: Diagnosis not present

## 2014-09-13 DIAGNOSIS — R05 Cough: Secondary | ICD-10-CM

## 2014-09-13 DIAGNOSIS — J209 Acute bronchitis, unspecified: Secondary | ICD-10-CM

## 2014-09-13 MED ORDER — PREDNISONE 20 MG PO TABS: 40.0000 mg | ORAL_TABLET | Freq: Every day | ORAL | Status: DC

## 2014-09-13 MED ORDER — ONDANSETRON 4 MG PO TBDP: 4.0000 mg | ORAL_TABLET | Freq: Three times a day (TID) | ORAL | Status: DC | PRN

## 2014-09-13 MED ORDER — BENZONATATE 100 MG PO CAPS: 100.0000 mg | ORAL_CAPSULE | Freq: Three times a day (TID) | ORAL | Status: DC

## 2014-09-13 MED ORDER — ALBUTEROL SULFATE (2.5 MG/3ML) 0.083% IN NEBU
5.0000 mg | INHALATION_SOLUTION | Freq: Once | RESPIRATORY_TRACT | Status: AC
  Administered 2014-09-13: 5 mg via RESPIRATORY_TRACT
  Filled 2014-09-13: qty 6

## 2014-09-13 MED ORDER — ALBUTEROL SULFATE HFA 108 (90 BASE) MCG/ACT IN AERS: 2.0000 | INHALATION_SPRAY | RESPIRATORY_TRACT | Status: DC | PRN

## 2014-09-13 NOTE — ED Provider Notes (Signed)
CSN: [id number]     Arrival date & time 09/13/14  0410 History   First MD Initiated Contact with Patient 09/13/14 0450     Chief Complaint  Patient presents with  . Cough     (Consider location/radiation/quality/duration/timing/severity/associated sxs/prior Treatment) HPI Comments: The patient is a 34 year old male, he has a history of asthma, he does not use any medications, he has had 3 days of what he describes as seasonal allergies, coughing, wheezing and some posttussive emesis. The symptoms are persistent, nothing makes it better or worse, he denies associated fevers or chills though he does have diffuse myalgias.  Patient is a 34 y.o. male presenting with cough. The history is provided by the patient.  Cough   Past Medical History  Diagnosis Date  . Depression   . Schizophrenia   . Bipolar 1 disorder    History reviewed. No pertinent past surgical history. Family History  Problem Relation Age of Onset  . Asthma Mother    History  Substance Use Topics  . Smoking status: Current Every Day Smoker -- 1.00 packs/day    Types: Cigarettes  . Smokeless tobacco: Not on file  . Alcohol Use: Yes    Review of Systems  Respiratory: Positive for cough.   All other systems reviewed and are negative.     Allergies  Salami  Home Medications   Prior to Admission medications   Medication Sig Start Date End Date Taking? Authorizing Provider  clonazePAM (KLONOPIN) 1 MG tablet Take 1 mg by mouth 2 (two) times daily as needed for anxiety.   Yes Historical Provider, MD  haloperidol (HALDOL) 5 MG tablet Take 1 tablet (5 mg total) by mouth 2 (two) times daily. 08/17/14  Yes Adonis BrookSheila Agustin, NP  albuterol (PROVENTIL HFA;VENTOLIN HFA) 108 (90 BASE) MCG/ACT inhaler Inhale 2 puffs into the lungs every 4 (four) hours as needed for wheezing or shortness of breath. 09/13/14   Eber HongBrian Zyaira Vejar, MD  benzonatate (TESSALON) 100 MG capsule Take 1 capsule (100 mg total) by mouth every 8 (eight) hours.  09/13/14   Eber HongBrian Marycatherine Maniscalco, MD  benztropine (COGENTIN) 0.5 MG tablet Take 1 tablet (0.5 mg total) by mouth 2 (two) times daily. Patient not taking: Reported on 09/13/2014 08/17/14   Adonis BrookSheila Agustin, NP  ondansetron (ZOFRAN ODT) 4 MG disintegrating tablet Take 1 tablet (4 mg total) by mouth every 8 (eight) hours as needed for nausea. 09/13/14   Eber HongBrian Orchid Glassberg, MD  predniSONE (DELTASONE) 20 MG tablet Take 2 tablets (40 mg total) by mouth daily. 09/13/14   Eber HongBrian Briany Aye, MD   BP 121/73 mmHg  Pulse 103  Temp(Src) 98.3 F (36.8 C) (Oral)  Resp 22  SpO2 100% Physical Exam  Constitutional: He appears well-developed and well-nourished. No distress.  HENT:  Head: Normocephalic and atraumatic.  Mouth/Throat: Oropharynx is clear and moist. No oropharyngeal exudate.  Eyes: Conjunctivae and EOM are normal. Pupils are equal, round, and reactive to light. Right eye exhibits no discharge. Left eye exhibits no discharge. No scleral icterus.  Neck: Normal range of motion. Neck supple. No JVD present. No thyromegaly present.  Cardiovascular: Normal rate, regular rhythm, normal heart sounds and intact distal pulses.  Exam reveals no gallop and no friction rub.   No murmur heard. Pulmonary/Chest: Effort normal. No respiratory distress. He has wheezes. He has no rales.  Abdominal: Soft. Bowel sounds are normal. He exhibits no distension and no mass. There is no tenderness.  Musculoskeletal: Normal range of motion. He exhibits no edema or tenderness.  Lymphadenopathy:    He has no cervical adenopathy.  Neurological: He is alert. Coordination normal.  Skin: Skin is warm and dry. No rash noted. No erythema.  Psychiatric: He has a normal mood and affect. His behavior is normal.  Nursing note and vitals reviewed.   ED Course  Procedures (including critical care time) Labs Review Labs Reviewed - No data to display  Imaging Review Dg Chest 2 View  09/13/2014   CLINICAL DATA:  Cough for 3 days.  EXAM: CHEST  2 VIEW   COMPARISON:  None.  FINDINGS: Cardiomediastinal silhouette is unremarkable. The lungs are clear without pleural effusions or focal consolidations. Trachea projects midline and there is no pneumothorax. Soft tissue planes and included osseous structures are non-suspicious.  IMPRESSION: Normal chest.   Electronically Signed   By: Awilda Metro   On: 09/13/2014 06:24      MDM   Final diagnoses:  Cough  Acute bronchitis, unspecified organism    Pt improved, and has less wheezing, albuterol, meds as below for home, xray without infiltrates.  Meds given in ED:  Medications  albuterol (PROVENTIL) (2.5 MG/3ML) 0.083% nebulizer solution 5 mg (5 mg Nebulization Given 09/13/14 0612)    New Prescriptions   ALBUTEROL (PROVENTIL HFA;VENTOLIN HFA) 108 (90 BASE) MCG/ACT INHALER    Inhale 2 puffs into the lungs every 4 (four) hours as needed for wheezing or shortness of breath.   BENZONATATE (TESSALON) 100 MG CAPSULE    Take 1 capsule (100 mg total) by mouth every 8 (eight) hours.   ONDANSETRON (ZOFRAN ODT) 4 MG DISINTEGRATING TABLET    Take 1 tablet (4 mg total) by mouth every 8 (eight) hours as needed for nausea.   PREDNISONE (DELTASONE) 20 MG TABLET    Take 2 tablets (40 mg total) by mouth daily.        Eber Hong, MD 09/13/14 [phone]

## 2014-09-13 NOTE — Discharge Instructions (Signed)

## 2014-09-13 NOTE — ED Notes (Signed)
Pt c/o cough that is so bad he "throws up".

## 2014-12-06 DIAGNOSIS — M791 Myalgia: Secondary | ICD-10-CM | POA: Diagnosis not present

## 2014-12-06 DIAGNOSIS — Z7952 Long term (current) use of systemic steroids: Secondary | ICD-10-CM | POA: Insufficient documentation

## 2014-12-06 DIAGNOSIS — F319 Bipolar disorder, unspecified: Secondary | ICD-10-CM | POA: Diagnosis not present

## 2014-12-06 DIAGNOSIS — R062 Wheezing: Secondary | ICD-10-CM | POA: Diagnosis present

## 2014-12-06 DIAGNOSIS — Z79899 Other long term (current) drug therapy: Secondary | ICD-10-CM | POA: Diagnosis not present

## 2014-12-06 DIAGNOSIS — Z720 Tobacco use: Secondary | ICD-10-CM | POA: Diagnosis not present

## 2014-12-06 DIAGNOSIS — J45901 Unspecified asthma with (acute) exacerbation: Secondary | ICD-10-CM | POA: Diagnosis not present

## 2014-12-07 ENCOUNTER — Emergency Department (HOSPITAL_COMMUNITY): Payer: Medicaid Other

## 2014-12-07 ENCOUNTER — Emergency Department (HOSPITAL_COMMUNITY)
Admission: EM | Admit: 2014-12-07 | Discharge: 2014-12-07 | Disposition: A | Discharge status: Home / Self Care | Payer: Medicaid Other | Attending: Emergency Medicine | Admitting: Emergency Medicine

## 2014-12-07 ENCOUNTER — Encounter (HOSPITAL_COMMUNITY): Payer: Self-pay | Admitting: Emergency Medicine

## 2014-12-07 DIAGNOSIS — J45901 Unspecified asthma with (acute) exacerbation: Secondary | ICD-10-CM

## 2014-12-07 DIAGNOSIS — Z720 Tobacco use: Secondary | ICD-10-CM

## 2014-12-07 MED ORDER — ALBUTEROL SULFATE (2.5 MG/3ML) 0.083% IN NEBU
5.0000 mg | INHALATION_SOLUTION | Freq: Once | RESPIRATORY_TRACT | Status: AC
  Administered 2014-12-07: 5 mg via RESPIRATORY_TRACT
  Filled 2014-12-07: qty 6

## 2014-12-07 MED ORDER — ALBUTEROL SULFATE HFA 108 (90 BASE) MCG/ACT IN AERS
1.0000 | INHALATION_SPRAY | RESPIRATORY_TRACT | Status: DC | PRN
  Administered 2014-12-07: 2 via RESPIRATORY_TRACT
  Filled 2014-12-07: qty 6.7

## 2014-12-07 MED ORDER — PREDNISONE 20 MG PO TABS: 60.0000 mg | ORAL_TABLET | Freq: Every day | ORAL | Status: DC

## 2014-12-07 MED ORDER — FENTANYL CITRATE (PF) 100 MCG/2ML IJ SOLN: 50.0000 ug | Freq: Once | INTRAMUSCULAR | Status: DC

## 2014-12-07 MED ORDER — PREDNISONE 20 MG PO TABS
60.0000 mg | ORAL_TABLET | Freq: Once | ORAL | Status: AC
  Administered 2014-12-07: 60 mg via ORAL
  Filled 2014-12-07: qty 3

## 2014-12-07 MED ORDER — ALBUTEROL SULFATE HFA 108 (90 BASE) MCG/ACT IN AERS: 2.0000 | INHALATION_SPRAY | RESPIRATORY_TRACT | Status: DC | PRN

## 2014-12-07 MED ORDER — ALBUTEROL (5 MG/ML) CONTINUOUS INHALATION SOLN
10.0000 mg/h | INHALATION_SOLUTION | Freq: Once | RESPIRATORY_TRACT | Status: AC
  Administered 2014-12-07: 10 mg/h via RESPIRATORY_TRACT
  Filled 2014-12-07: qty 20

## 2014-12-07 MED ORDER — PREDNISONE 20 MG PO TABS: 40.0000 mg | ORAL_TABLET | Freq: Every day | ORAL | Status: DC

## 2014-12-07 NOTE — Discharge Instructions (Signed)
Asthma Attack Prevention Although there is no way to prevent asthma from starting, you can take steps to control the disease and reduce its symptoms. Learn about your asthma and how to control it. Take an active role to control your asthma by working with your health care provider to create and follow an asthma action plan. An asthma action plan guides you in:  Taking your medicines properly.  Avoiding things that set off your asthma or make your asthma worse (asthma triggers).  Tracking your level of asthma control.  Responding to worsening asthma.  Seeking emergency care when needed. To track your asthma, keep records of your symptoms, check your peak flow number using a handheld device that shows how well air moves out of your lungs (peak flow meter), and get regular asthma checkups.  WHAT ARE SOME WAYS TO PREVENT AN ASTHMA ATTACK?  Take medicines as directed by your health care provider.  Keep track of your asthma symptoms and level of control.  With your health care provider, write a detailed plan for taking medicines and managing an asthma attack. Then be sure to follow your action plan. Asthma is an ongoing condition that needs regular monitoring and treatment.  Identify and avoid asthma triggers. Many outdoor allergens and irritants (such as pollen, mold, cold air, and air pollution) can trigger asthma attacks. Find out what your asthma triggers are and take steps to avoid them.  Monitor your breathing. Learn to recognize warning signs of an attack, such as coughing, wheezing, or shortness of breath. Your lung function may decrease before you notice any signs or symptoms, so regularly measure and record your peak airflow with a home peak flow meter.  Identify and treat attacks early. If you act quickly, you are less likely to have a severe attack. You will also need less medicine to control your symptoms. When your peak flow measurements decrease and alert you to an upcoming attack,  take your medicine as instructed and immediately stop any activity that may have triggered the attack. If your symptoms do not improve, get medical help.  Pay attention to increasing quick-relief inhaler use. If you find yourself relying on your quick-relief inhaler, your asthma is not under control. See your health care provider about adjusting your treatment. WHAT CAN MAKE MY SYMPTOMS WORSE? A number of common things can set off or make your asthma symptoms worse and cause temporary increased inflammation of your airways. Keep track of your asthma symptoms for several weeks, detailing all the environmental and emotional factors that are linked with your asthma. When you have an asthma attack, go back to your asthma diary to see which factor, or combination of factors, might have contributed to it. Once you know what these factors are, you can take steps to control many of them. If you have allergies and asthma, it is important to take asthma prevention steps at home. Minimizing contact with the substance to which you are allergic will help prevent an asthma attack. Some triggers and ways to avoid these triggers are: Animal Dander:  Some people are allergic to the flakes of skin or dried saliva from animals with fur or feathers.   There is no such thing as a hypoallergenic dog or cat breed. All dogs or cats can cause allergies, even if they don't shed.  Keep these pets out of your home.  If you are not able to keep a pet outdoors, keep the pet out of your bedroom and other sleeping areas at all  times, and keep the door closed. °· Remove carpets and furniture covered with cloth from your home. If that is not possible, keep the pet away from fabric-covered furniture and carpets. °Dust Mites: °Many people with asthma are allergic to dust mites. Dust mites are tiny bugs that are found in every home in mattresses, pillows, carpets, fabric-covered furniture, bedcovers, clothes, stuffed toys, and other  fabric-covered items.  °· Cover your mattress in a special dust-proof cover. °· Cover your pillow in a special dust-proof cover, or wash the pillow each week in hot water. Water must be hotter than 130° F (54.4° C) to kill dust mites. Cold or warm water used with detergent and bleach can also be effective. °· Wash the sheets and blankets on your bed each week in hot water. °· Try not to sleep or lie on cloth-covered cushions. °· Call ahead when traveling and ask for a smoke-free hotel room. Bring your own bedding and pillows in case the hotel only supplies feather pillows and down comforters, which may contain dust mites and cause asthma symptoms. °· Remove carpets from your bedroom and those laid on concrete, if you can. °· Keep stuffed toys out of the bed, or wash the toys weekly in hot water or cooler water with detergent and bleach. °Cockroaches: °Many people with asthma are allergic to the droppings and remains of cockroaches.  °· Keep food and garbage in closed containers. Never leave food out. °· Use poison baits, traps, powders, gels, or paste (for example, boric acid). °· If a spray is used to kill cockroaches, stay out of the room until the odor goes away. °Indoor Mold: °· Fix leaky faucets, pipes, or other sources of water that have mold around them. °· Clean floors and moldy surfaces with a fungicide or diluted bleach. °· Avoid using humidifiers, vaporizers, or swamp coolers. These can spread molds through the air. °Pollen and Outdoor Mold: °· When pollen or mold spore counts are high, try to keep your windows closed. °· Stay indoors with windows closed from late morning to afternoon. Pollen and some mold spore counts are highest at that time. °· Ask your health care provider whether you need to take anti-inflammatory medicine or increase your dose of the medicine before your allergy season starts. °Other Irritants to Avoid: °· Tobacco smoke is an irritant. If you smoke, ask your health care provider how  you can quit. Ask family members to quit smoking, too. Do not allow smoking in your home or car. °· If possible, do not use a wood-burning stove, kerosene heater, or fireplace. Minimize exposure to all sources of smoke, including incense, candles, fires, and fireworks. °· Try to stay away from strong odors and sprays, such as perfume, talcum powder, hair spray, and paints. °· Decrease humidity in your home and use an indoor air cleaning device. Reduce indoor humidity to below 60%. Dehumidifiers or central air conditioners can do this. °· Decrease house dust exposure by changing furnace and air cooler filters frequently. °· Try to have someone else vacuum for you once or twice a week. Stay out of rooms while they are being vacuumed and for a short while afterward. °· If you vacuum, use a dust mask from a hardware store, a double-layered or microfilter vacuum cleaner bag, or a vacuum cleaner with a HEPA filter. °· Sulfites in foods and beverages can be irritants. Do not drink beer or wine or eat dried fruit, processed potatoes, or shrimp if they cause asthma symptoms. °· Cold   air can trigger an asthma attack. Cover your nose and mouth with a scarf on cold or windy days.  Several health conditions can make asthma more difficult to manage, including a runny nose, sinus infections, reflux disease, psychological stress, and sleep apnea. Work with your health care provider to manage these conditions.  Avoid close contact with people who have a respiratory infection such as a cold or the flu, since your asthma symptoms may get worse if you catch the infection. Wash your hands thoroughly after touching items that may have been handled by people with a respiratory infection.  Get a flu shot every year to protect against the flu virus, which often makes asthma worse for days or weeks. Also get a pneumonia shot if you have not previously had one. Unlike the flu shot, the pneumonia shot does not need to be given  yearly. Medicines:  Talk to your health care provider about whether it is safe for you to take aspirin or non-steroidal anti-inflammatory medicines (NSAIDs). In a small number of people with asthma, aspirin and NSAIDs can cause asthma attacks. These medicines must be avoided by people who have known aspirin-sensitive asthma. It is important that people with aspirin-sensitive asthma read labels of all over-the-counter medicines used to treat pain, colds, coughs, and fever.  Beta-blockers and ACE inhibitors are other medicines you should discuss with your health care provider. HOW CAN I FIND OUT WHAT I AM ALLERGIC TO? Ask your asthma health care provider about allergy skin testing or blood testing (the RAST test) to identify the allergens to which you are sensitive. If you are found to have allergies, the most important thing to do is to try to avoid exposure to any allergens that you are sensitive to as much as possible. Other treatments for allergies, such as medicines and allergy shots (immunotherapy) are available.  CAN I EXERCISE? Follow your health care provider's advice regarding asthma treatment before exercising. It is important to maintain a regular exercise program, but vigorous exercise or exercise in cold, humid, or dry environments can cause asthma attacks, especially for those people who have exercise-induced asthma. Document Released: 05/08/2009 Document Revised: 05/25/2013 Document Reviewed: 11/25/2012 [facility] Patient Information 2015 [place], [place]. This information is not intended to replace advice given to you by your health care provider. Make sure you discuss any questions you have with your health care provider.  Asthma Asthma is a recurring condition in which the airways tighten and narrow. Asthma can make it difficult to breathe. It can cause coughing, wheezing, and shortness of breath. Asthma episodes, also called asthma attacks, range from minor to life-threatening. Asthma  cannot be cured, but medicines and lifestyle changes can help control it. CAUSES Asthma is believed to be caused by inherited (genetic) and environmental factors, but its exact cause is unknown. Asthma may be triggered by allergens, lung infections, or irritants in the air. Asthma triggers are different for each person. Common triggers include:   Animal dander.  Dust mites.  Cockroaches.  Pollen from trees or grass.  Mold.  Smoke.  Air pollutants such as dust, household cleaners, hair sprays, aerosol sprays, paint fumes, strong chemicals, or strong odors.  Cold air, weather changes, and winds (which increase molds and pollens in the air).  Strong emotional expressions such as crying or laughing hard.  Stress.  Certain medicines (such as aspirin) or types of drugs (such as beta-blockers).  Sulfites in foods and drinks. Foods and drinks that may contain sulfites include dried fruit, potato  chips, and sparkling grape juice. °· Infections or inflammatory conditions such as the flu, a cold, or an inflammation of the nasal membranes (rhinitis). °· Gastroesophageal reflux disease (GERD). °· Exercise or strenuous activity. °SYMPTOMS °Symptoms may occur immediately after asthma is triggered or many hours later. Symptoms include: °· Wheezing. °· Excessive nighttime or early morning coughing. °· Frequent or severe coughing with a common cold. °· Chest tightness. °· Shortness of breath. °DIAGNOSIS  °The diagnosis of asthma is made by a review of your medical history and a physical exam. Tests may also be performed. These may include: °· Lung function studies. These tests show how much air you breathe in and out. °· Allergy tests. °· Imaging tests such as X-rays. °TREATMENT  °Asthma cannot be cured, but it can usually be controlled. Treatment involves identifying and avoiding your asthma triggers. It also involves medicines. There are 2 classes of medicine used for asthma treatment:  °· Controller  medicines. These prevent asthma symptoms from occurring. They are usually taken every day. °· Reliever or rescue medicines. These quickly relieve asthma symptoms. They are used as needed and provide short-term relief. °Your health care provider will help you create an asthma action plan. An asthma action plan is a written plan for managing and treating your asthma attacks. It includes a list of your asthma triggers and how they may be avoided. It also includes information on when medicines should be taken and when their dosage should be changed. An action plan may also involve the use of a device called a peak flow meter. A peak flow meter measures how well the lungs are working. It helps you monitor your condition. °HOME CARE INSTRUCTIONS  °· Take medicines only as directed by your health care provider. Speak with your health care provider if you have questions about how or when to take the medicines. °· Use a peak flow meter as directed by your health care provider. Record and keep track of readings. °· Understand and use the action plan to help minimize or stop an asthma attack without needing to seek medical care. °· Control your home environment in the following ways to help prevent asthma attacks: °¨ Do not smoke. Avoid being exposed to secondhand smoke. °¨ Change your heating and air conditioning filter regularly. °¨ Limit your use of fireplaces and wood stoves. °¨ Get rid of pests (such as roaches and mice) and their droppings. °¨ Throw away plants if you see mold on them. °¨ Clean your floors and dust regularly. Use unscented cleaning products. °¨ Try to have someone else vacuum for you regularly. Stay out of rooms while they are being vacuumed and for a short while afterward. If you vacuum, use a dust mask from a hardware store, a double-layered or microfilter vacuum cleaner bag, or a vacuum cleaner with a HEPA filter. °¨ Replace carpet with wood, tile, or vinyl flooring. Carpet can trap dander and  dust. °¨ Use allergy-proof pillows, mattress covers, and box spring covers. °¨ Wash bed sheets and blankets every week in hot water and dry them in a dryer. °¨ Use blankets that are made of polyester or cotton. °¨ Clean bathrooms and kitchens with bleach. If possible, have someone repaint the walls in these rooms with mold-resistant paint. Keep out of the rooms that are being cleaned and painted. °¨ Wash hands frequently. °SEEK MEDICAL CARE IF:  °· You have wheezing, shortness of breath, or a cough even if taking medicine to prevent attacks. °· The colored mucus   you cough up (sputum) is thicker than usual.  Your sputum changes from clear or white to yellow, green, gray, or bloody.  You have any problems that may be related to the medicines you are taking (such as a rash, itching, swelling, or trouble breathing).  You are using a reliever medicine more than 2-3 times per week.  Your peak flow is still at 50-79% of your personal best after following your action plan for 1 hour.  You have a fever. SEEK IMMEDIATE MEDICAL CARE IF:   You seem to be getting worse and are unresponsive to treatment during an asthma attack.  You are short of breath even at rest.  You get short of breath when doing very little physical activity.  You have difficulty eating, drinking, or talking due to asthma symptoms.  You develop chest pain.  You develop a fast heartbeat.  You have a bluish color to your lips or fingernails.  You are light-headed, dizzy, or faint.  Your peak flow is less than 50% of your personal best. MAKE SURE YOU:   Understand these instructions.  Will watch your condition.  Will get help right away if you are not doing well or get worse. Document Released: 05/20/2005 Document Revised: 10/04/2013 Document Reviewed: 12/17/2012 [facility] Patient Information 2015 [place], [place]. This information is not intended to replace advice given to you by your health care provider. Make sure you  discuss any questions you have with your health care provider.  You Can Quit Smoking If you are ready to quit smoking or are thinking about it, congratulations! You have chosen to help yourself be healthier and live longer! There are lots of different ways to quit smoking. Nicotine gum, nicotine patches, a nicotine inhaler, or nicotine nasal spray can help with physical craving. Hypnosis, support groups, and medicines help break the habit of smoking. TIPS TO GET OFF AND STAY OFF CIGARETTES  Learn to predict your moods. Do not let a bad situation be your excuse to have a cigarette. Some situations in your life might tempt you to have a cigarette.  Ask friends and co-workers not to smoke around you.  Make your home smoke-free.  Never have "just one" cigarette. It leads to wanting another and another. Remind yourself of your decision to quit.  On a card, make a list of your reasons for not smoking. Read it at least the same number of times a day as you have a cigarette. Tell yourself everyday, "I do not want to smoke. I choose not to smoke."  Ask someone at home or work to help you with your plan to quit smoking.  Have something planned after you eat or have a cup of coffee. Take a walk or get other exercise to perk you up. This will help to keep you from overeating.  Try a relaxation exercise to calm you down and decrease your stress. Remember, you may be tense and nervous the first two weeks after you quit. This will pass.  Find new activities to keep your hands busy. Play with a pen, coin, or rubber band. Doodle or draw things on paper.  Brush your teeth right after eating. This will help cut down the craving for the taste of tobacco after meals. You can try mouthwash too.  Try gum, breath mints, or diet candy to keep something in your mouth. IF YOU SMOKE AND WANT TO QUIT:  Do not stock up on cigarettes. Never buy a carton. Wait until one pack is finished  before you buy another.  Never  carry cigarettes with you at work or at home.  Keep cigarettes as far away from you as possible. Leave them with someone else.  Never carry matches or a lighter with you.  Ask yourself, "Do I need this cigarette or is this just a reflex?"  Bet with someone that you can quit. Put cigarette money in a piggy bank every morning. If you smoke, you give up the money. If you do not smoke, by the end of the week, you keep the money.  Keep trying. It takes 21 days to change a habit!  Talk to your doctor about using medicines to help you quit. These include nicotine replacement gum, lozenges, or skin patches. Document Released: 03/16/2009 Document Revised: 08/12/2011 Document Reviewed: 03/16/2009 [facility] Patient Information 2015 [place], [place]. This information is not intended to replace advice given to you by your health care provider. Make sure you discuss any questions you have with your health care provider.   Emergency Department Resource Guide 1) Find a Doctor and Pay Out of Pocket Although you won't have to find out who is covered by your insurance plan, it is a good idea to ask around and get recommendations. You will then need to call the office and see if the doctor you have chosen will accept you as a new patient and what types of options they offer for patients who are self-pay. Some doctors offer discounts or will set up payment plans for their patients who do not have insurance, but you will need to ask so you aren't surprised when you get to your appointment.  2) Contact Your Local Health Department Not all health departments have doctors that can see patients for sick visits, but many do, so it is worth a call to see if yours does. If you don't know where your local health department is, you can check in your phone book. The CDC also has a tool to help you locate your state's health department, and many state websites also have listings of all of their local health departments.  3)  Find a Walk-in Clinic If your illness is not likely to be very severe or complicated, you may want to try a walk in clinic. These are popping up all over the country in pharmacies, drugstores, and shopping centers. They're usually staffed by nurse practitioners or physician assistants that have been trained to treat common illnesses and complaints. They're usually fairly quick and inexpensive. However, if you have serious medical issues or chronic medical problems, these are probably not your best option.  No Primary Care Doctor: - Call [facility] Connect at  [phone] - they can help you locate a primary care doctor that  accepts your insurance, provides certain services, etc. - Physician Referral Service- [phone]  Chronic Pain Problems: Organization         Address  Phone   Notes  [facility]  [phone] Patients need to be referred by their primary care doctor.   Medication Assistance: Organization         Address  Phone   Notes  [facility] Medication [facility] [address] [place], [place] [address] [phone] --Must be a resident of [facility] -- Must have NO insurance coverage whatsoever (no Medicaid/ Medicare, etc.) -- The pt. MUST have a primary care doctor that directs their care regularly and follows them in the community   [facility]  [phone]   [address]  [phone]    Agencies that provide inexpensive medical care: Organization         Address  Phone   Notes  [facility]  [phone]   [facility]    [phone]   [facility] [address] [place], [place] [address] [phone]   [facility] [address], [place] [phone]   [facility]    [phone]) [phone]   [facility]    [phone]   [facility]  [address], [place] Phone:  [phone], Fax:  [phone]  [phone] Hours of Operation:  9 am - 6 pm, M-F.  Also accepts Medicaid/Medicare and self-pay.  [facility]  [address], [place] Phone: [phone], Fax: [phone]. Hours of Operation:  8:30 am - 5:30 pm, M-F.  Also accepts Medicaid and self-pay.  [facility] [place] [address], [place] Phone: [phone]   [facility] [address], [place], [place] [phone], Ext. [phone] Mondays & Thursdays: 7-9 AM.  First 15 patients are seen on a first come, first serve basis.    Medicaid-accepting [facility] Providers:  Organization         Address  Phone   Notes  [facility] [address], [place] [phone] Also accepts self-pay patients.  [facility] [address], [place]  [phone]   [facility] [address], [place] [phone]   [facility] [address] [place] [address], [place] [phone]   [facility] Rakers[facility] Bland [address], [place]   [phone] Only accepts [place] patients after they have their name applied to their card.   Self-Pay (no insurance) in [facility]:  Organization         Address  Phone   Notes  Sickle Cell Patients, [facility] [address], [place] [phone]   [facility] [address], [place] [phone]   [facility]  [address], [place] [phone]   [facility]/Dr. Osei-Bonsu  [address] [place] [address], [place] or [phone], [place] [phone] Phone number for both [place] and [place] locations is the same.  [facility] [address], [place] [phone]   [facility] [address] [place] [address], [place] or [address] [phone]     [facility] [address], [place] [phone], phone; [phone], fax Sees patients 1st and 3rd Saturday of every month.  Must not qualify for public or private insurance (i.e. Medicaid, Medicare, [place] Health Choice, Veterans' Benefits)  Household income should be no more than 200% of the poverty level The clinic cannot treat you if you are pregnant or think you are pregnant  Sexually transmitted diseases are not treated at the clinic.    Dental Care: Organization         Address  Phone  Notes  [facility] [address], [place] [phone] Accepts children up to age 33 who are enrolled in [place] or [place] Health Choice; pregnant women with a Medicaid card;  and children who have applied for Medicaid or [place] Health Choice, but were declined, whose parents can pay a reduced fee at time of service.  [facility]  [address], [place] [phone] Accepts children up to age 57 who are enrolled in [place] or [place] Health Choice; pregnant women with a Medicaid card; and children who have applied for Medicaid or [place] Health Choice, but were declined, whose parents can pay a reduced fee at time of service.  [facility]  [address] [place], [place] [phone] Patients are seen by appointment only. Walk-ins are not accepted. [facility] will see patients 3 years of age and older. Monday - Tuesday (8am-5pm) Most Wednesdays (8:30-5pm) $30 per visit, cash only  [facility]  [address], [facility] [phone] Patients are seen by appointment only. Walk-ins are not accepted. [facility] will see patients 51 years of age and older. One Wednesday Evening (Monthly: Volunteer Based).  $30 per visit, cash only  [employer]  [phone] for adults; Children under age 4, call  Graduate Pediatric Dentistry at [phone]. Children aged 59-14, please call [phone] to request a pediatric application.  Dental services are provided in all areas of dental care including fillings, crowns and bridges, complete and partial dentures, implants, gum treatment, root canals, and extractions. Preventive care is also provided. Treatment is provided to both adults and children. Patients are selected via a lottery and there is often a waiting list.   [facility] [address] [place]  [phone] [email or web address]   [facility] [address] [place], [place] [phone], Ext. [phone] Second and Fourth Thursday of each month, opens at 6:30 AM; Clinic ends at 9 AM.  Patients are seen on a first-come first-served basis, and a limited number are seen during each clinic.   [facility]  [address] [place] [address] [place], [place] [phone]   Eligibility Requirements You must have lived in [place], [place], or [place] counties for at least the last three months.   You cannot be eligible for state or federal sponsored [place], including [employer], [place], or [employer].   You generally cannot be eligible for healthcare insurance through your employer.    How to apply: Eligibility screenings are held every Tuesday and Wednesday afternoon from 1:00 pm until 4:00 pm. You do not need an appointment for the interview!  [facility] [address], [place], [place] [phone]   [facility] Health Department  [phone]   [facility] Health Department  [phone]   [facility] Health Department  [phone]    Behavioral Health Resources in the Community: Intensive Outpatient Programs Organization         Address  Phone  Notes  [facility] [address] [place], [place] [phone]   [facility] Outpatient [address] [place], [place]  [phone]   [facility] [address] [place], [place]  [phone]   [facility] Mental Health [address]  [place], [place] [phone] or [phone]   Substance Abuse Resources Organization         Address  Phone  Notes  [facility]  [phone]   [facility]  [phone]   [facility] [place]  [phone]   [facility]  [phone]  Residential & Outpatient Substance Abuse Program  [phone]   Psychological Services Organization         Address  Phone  Notes  [facility] Behavioral Health  [phone]   [facility]  [phone]   [facility] [phone] [address]  [phone] or [phone]    Mobile Crisis Teams Organization         Address  Phone  Notes  [facility], Mobile Crisis Care Unit  [phone]   [facility]  [address] [place], [place] [phone]   Doristine Locks [address] [place] [phone]    Self-Help/Support Groups Organization         Address  Phone             Notes  [facility] [place] - variety of support groups  [phone]- I7437963 Call for more information  Narcotics Anonymous (NA), [facility] [address], [employer] [place]  2 meetings at this location   Statistician         Address  Phone  Notes  [facility] [address],    [place]  [phone]   [facility]  [address] [place] [address], [place], [place] [phone]   [facility] [address] [place], [place] [phone] Admissions: 8am-3pm M-F  [facility] [address],    [place], [place] [phone]   [facility] [address] [place], [place], [place] [phone]   [facility] [address]  [place], [place] [phone]   [facility] - Intensive Outpatient [address], [place], [place] [phone]   [facility] ([facility] [address] [place]  [place], [place] [phone] or [phone]   [facility] ([facility]) [address], [place], [place] [phone] Accepts Medicaid  [facility] [address],  [place] [phone] Substance Abuse/Addiction Treatment   [facility] Organization         Address  Phone  Notes  [facility]  [phone]   Angie Fava, PhD [address] [place], [place]   [phone] or [phone]   [facility] Behavioral   [address] [place], [place] [phone]   [facility] [address], [place], [place] [phone] Insurance/Medicaid/sponsorship through [facility] and Families [address]                                    [place], [place] [phone] Therapy/tele-psych/case  [facility] [address], [place] [phone]    Dr. Lolly Mustache  [phone]   [facility] [place]  [employer] [facility] Dept. 1) [address], [place] 2) [address], [place] 3)  [address], [place] [phone]  [phone]   [facility] Child Abuse Hotline [phone] or [phone] (After Hours)

## 2014-12-07 NOTE — ED Provider Notes (Signed)
CSN: [id number]     Arrival date & time 12/06/14  2319 History   This chart was scribed for  Ronald Severin, MD by Bethel Born, ED Scribe. This patient was seen in room D32C/D32C and the patient's care was started at 12:37 AM.   Chief Complaint  Patient presents with  . Wheezing  . Cough    HPI Ronald Martinez is a 34 y.o. male who presents to the Emergency Department complaining of increasing asthma exacerbation with gradual onset 5 days PTA. Associated symptoms include SOB, cough productive of clear sputum, wheezing, myalgias, and sneezing. Pt denies fever. He has been out of his home inhalers for 1 week. Tobacco+. Pt requesting tobacco cessation resources.   Past Medical History  Diagnosis Date  . Depression   . Schizophrenia   . Bipolar 1 disorder    History reviewed. No pertinent past surgical history. Family History  Problem Relation Age of Onset  . Asthma Mother    History  Substance Use Topics  . Smoking status: Current Every Day Smoker -- 1.00 packs/day    Types: Cigarettes  . Smokeless tobacco: Not on file  . Alcohol Use: Yes    Review of Systems  Constitutional: Negative for fever, activity change, appetite change and fatigue.  HENT: Positive for sneezing. Negative for congestion, facial swelling, rhinorrhea and trouble swallowing.   Eyes: Negative for photophobia and pain.  Respiratory: Positive for cough, shortness of breath and wheezing. Negative for chest tightness.   Cardiovascular: Negative for chest pain and leg swelling.  Gastrointestinal: Negative for nausea, vomiting, abdominal pain, diarrhea and constipation.  Endocrine: Negative for polydipsia and polyuria.  Genitourinary: Negative for dysuria, urgency, decreased urine volume and difficulty urinating.  Musculoskeletal: Positive for myalgias. Negative for back pain and gait problem.  Skin: Negative for color change, rash and wound.  Allergic/Immunologic: Negative for immunocompromised state.   Neurological: Negative for dizziness, facial asymmetry, speech difficulty, weakness, numbness and headaches.  Psychiatric/Behavioral: Negative for confusion, decreased concentration and agitation.      Allergies  Salami  Home Medications   Prior to Admission medications   Medication Sig Start Date End Date Taking? Authorizing Provider  albuterol (PROVENTIL HFA;VENTOLIN HFA) 108 (90 BASE) MCG/ACT inhaler Inhale 2 puffs into the lungs every 4 (four) hours as needed for wheezing or shortness of breath. 09/13/14   Eber Hong, MD  benzonatate (TESSALON) 100 MG capsule Take 1 capsule (100 mg total) by mouth every 8 (eight) hours. 09/13/14   Eber Hong, MD  benztropine (COGENTIN) 0.5 MG tablet Take 1 tablet (0.5 mg total) by mouth 2 (two) times daily. Patient not taking: Reported on 09/13/2014 08/17/14   Adonis Brook, NP  clonazePAM (KLONOPIN) 1 MG tablet Take 1 mg by mouth 2 (two) times daily as needed for anxiety.    Historical Provider, MD  haloperidol (HALDOL) 5 MG tablet Take 1 tablet (5 mg total) by mouth 2 (two) times daily. 08/17/14   Adonis Brook, NP  ondansetron (ZOFRAN ODT) 4 MG disintegrating tablet Take 1 tablet (4 mg total) by mouth every 8 (eight) hours as needed for nausea. 09/13/14   Eber Hong, MD  predniSONE (DELTASONE) 20 MG tablet Take 2 tablets (40 mg total) by mouth daily. 09/13/14   Eber Hong, MD   Triage Vitals: BP 152/24 mmHg  Pulse 121  Temp(Src) 99.3 F (37.4 C)  Resp 18  Ht  (1.803 m)  Wt 195 lb 8 oz (88.678 kg)  BMI 27.28 kg/m2  SpO2 99% Physical  Exam  Constitutional: He is oriented to person, place, and time. He appears well-developed and well-nourished. No distress.  HENT:  Head: Normocephalic and atraumatic.  Nose: Nose normal.  Mouth/Throat: Oropharynx is clear and moist.  Eyes: Conjunctivae and EOM are normal. Pupils are equal, round, and reactive to light.  Neck: Normal range of motion. Neck supple. No JVD present. No tracheal deviation  present. No thyromegaly present.  Cardiovascular: Normal rate, regular rhythm, normal heart sounds and intact distal pulses.  Exam reveals no gallop and no friction rub.   No murmur heard. Pulmonary/Chest: Effort normal. No stridor. No respiratory distress. He has wheezes. He has no rales. He exhibits no tenderness.  Abdominal: Soft. Bowel sounds are normal. He exhibits no distension and no mass. There is no tenderness. There is no rebound and no guarding.  Musculoskeletal: Normal range of motion. He exhibits no edema or tenderness.  Lymphadenopathy:    He has no cervical adenopathy.  Neurological: He is alert and oriented to person, place, and time. He displays normal reflexes. He exhibits normal muscle tone. Coordination normal.  Skin: Skin is warm and dry. No rash noted. No erythema. No pallor.  Psychiatric: He has a normal mood and affect. His behavior is normal. Judgment and thought content normal.  Nursing note and vitals reviewed.   ED Course  Procedures  DIAGNOSTIC STUDIES: Oxygen Saturation is 99% on RA, normal by my interpretation.    COORDINATION OF CARE: 12:40 AM Discussed treatment plan which includes a breathing treatment with pt at bedside and pt agreed to plan.  2:11 AM I re-evaluated the patient and provided an update on the treatment plan including discharge to f/u with PCP and prescriptions for albuterol.   Labs Review Labs Reviewed - No data to display  Imaging Review Dg Chest 2 View  12/07/2014   CLINICAL DATA:  34 year old male with shortness of breath and cough  EXAM: CHEST  2 VIEW  COMPARISON:  None.  FINDINGS: The heart size and mediastinal contours are within normal limits. Both lungs are clear. The visualized skeletal structures are unremarkable.  IMPRESSION: No active cardiopulmonary disease.   Electronically Signed   By: Elgie CollardArash  Radparvar M.D.   On: 12/07/2014 00:40     EKG Interpretation None      MDM   Final diagnoses:  Asthma exacerbation   Tobacco abuse    I personally performed the services described in this documentation, which was scribed in my presence. The recorded information has been reviewed and is accurate.   34 year old male, current smoker, history of asthma.  After hour long neb, wheezing has resolved.  Patient is stable for discharge home.    Ronald Severinlga Mercie Balsley, MD 12/07/14 [phone]

## 2014-12-07 NOTE — ED Notes (Signed)
Pt. reports wheezing with productive cough and chest congestion onset this week , denies fever or chills.

## 2014-12-07 NOTE — ED Notes (Signed)
Pt's neb treatment has finished.

## 2014-12-13 ENCOUNTER — Emergency Department (HOSPITAL_COMMUNITY)
Admission: EM | Admit: 2014-12-13 | Discharge: 2014-12-14 | Disposition: A | Discharge status: Home / Self Care | Payer: Medicaid Other | Attending: Emergency Medicine | Admitting: Emergency Medicine

## 2014-12-13 ENCOUNTER — Encounter (HOSPITAL_COMMUNITY): Payer: Self-pay | Admitting: Emergency Medicine

## 2014-12-13 DIAGNOSIS — F419 Anxiety disorder, unspecified: Secondary | ICD-10-CM | POA: Diagnosis not present

## 2014-12-13 DIAGNOSIS — F1220 Cannabis dependence, uncomplicated: Secondary | ICD-10-CM | POA: Diagnosis not present

## 2014-12-13 DIAGNOSIS — F1210 Cannabis abuse, uncomplicated: Secondary | ICD-10-CM | POA: Insufficient documentation

## 2014-12-13 DIAGNOSIS — R443 Hallucinations, unspecified: Secondary | ICD-10-CM | POA: Diagnosis present

## 2014-12-13 DIAGNOSIS — Z79899 Other long term (current) drug therapy: Secondary | ICD-10-CM | POA: Diagnosis not present

## 2014-12-13 DIAGNOSIS — F250 Schizoaffective disorder, bipolar type: Secondary | ICD-10-CM

## 2014-12-13 DIAGNOSIS — F22 Delusional disorders: Secondary | ICD-10-CM | POA: Insufficient documentation

## 2014-12-13 DIAGNOSIS — Z720 Tobacco use: Secondary | ICD-10-CM | POA: Diagnosis not present

## 2014-12-13 DIAGNOSIS — J45901 Unspecified asthma with (acute) exacerbation: Secondary | ICD-10-CM | POA: Diagnosis not present

## 2014-12-13 LAB — CBC
HCT: 38 % — ABNORMAL LOW (ref 39.0–52.0)
Hemoglobin: 12.9 g/dL — ABNORMAL LOW (ref 13.0–17.0)
MCH: 27 pg (ref 26.0–34.0)
MCHC: 33.9 g/dL (ref 30.0–36.0)
MCV: 79.7 fL (ref 78.0–100.0)
Platelets: 310 10*3/uL (ref 150–400)
RBC: 4.77 MIL/uL (ref 4.22–5.81)
RDW: 14.2 % (ref 11.5–15.5)
WBC: 6 10*3/uL (ref 4.0–10.5)

## 2014-12-13 LAB — COMPREHENSIVE METABOLIC PANEL
ALT: 23 U/L (ref 17–63)
AST: 24 U/L (ref 15–41)
Albumin: 4.1 g/dL (ref 3.5–5.0)
Alkaline Phosphatase: 58 U/L (ref 38–126)
Anion gap: 8 (ref 5–15)
BUN: 12 mg/dL (ref 6–20)
CHLORIDE: 107 mmol/L (ref 101–111)
CO2: 23 mmol/L (ref 22–32)
CREATININE: 0.89 mg/dL (ref 0.61–1.24)
Calcium: 9.2 mg/dL (ref 8.9–10.3)
GFR calc Af Amer: >60 mL/min (ref >60)
GLUCOSE: 97 mg/dL (ref 65–99)
Potassium: 3.3 mmol/L — ABNORMAL LOW (ref 3.5–5.1)
SODIUM: 138 mmol/L (ref 135–145)
TOTAL PROTEIN: 7.3 g/dL (ref 6.5–8.1)
Total Bilirubin: 0.6 mg/dL (ref 0.3–1.2)

## 2014-12-13 LAB — RAPID URINE DRUG SCREEN, HOSP PERFORMED
AMPHETAMINES: NOT DETECTED
BENZODIAZEPINES: NOT DETECTED
Barbiturates: NOT DETECTED
Cocaine: NOT DETECTED
Opiates: NOT DETECTED
Tetrahydrocannabinol: POSITIVE — AB

## 2014-12-13 LAB — ETHANOL: Alcohol, Ethyl (B): <5 mg/dL (ref <5)

## 2014-12-13 MED ORDER — BENZTROPINE MESYLATE 1 MG PO TABS
0.5000 mg | ORAL_TABLET | Freq: Two times a day (BID) | ORAL | Status: DC
  Administered 2014-12-13 – 2014-12-14 (×3): 0.5 mg via ORAL
  Filled 2014-12-13 (×3): qty 1

## 2014-12-13 MED ORDER — IPRATROPIUM-ALBUTEROL 0.5-2.5 (3) MG/3ML IN SOLN
3.0000 mL | Freq: Once | RESPIRATORY_TRACT | Status: AC
  Administered 2014-12-13: 3 mL via RESPIRATORY_TRACT
  Filled 2014-12-13: qty 3

## 2014-12-13 MED ORDER — LORAZEPAM 1 MG PO TABS: 1.0000 mg | ORAL_TABLET | Freq: Four times a day (QID) | ORAL | Status: DC | PRN

## 2014-12-13 MED ORDER — ACETAMINOPHEN 325 MG PO TABS
650.0000 mg | ORAL_TABLET | ORAL | Status: DC | PRN
Start: 2014-12-13 — End: 2014-12-14

## 2014-12-13 MED ORDER — ZIPRASIDONE MESYLATE 20 MG IM SOLR
20.0000 mg | Freq: Once | INTRAMUSCULAR | Status: AC
  Administered 2014-12-13: 20 mg via INTRAMUSCULAR
  Filled 2014-12-13: qty 20

## 2014-12-13 MED ORDER — STERILE WATER FOR INJECTION IJ SOLN
INTRAMUSCULAR | Status: AC
  Administered 2014-12-13: 15:00:00
  Filled 2014-12-13: qty 10

## 2014-12-13 MED ORDER — ZOLPIDEM TARTRATE 5 MG PO TABS: 5.0000 mg | ORAL_TABLET | Freq: Every evening | ORAL | Status: DC | PRN

## 2014-12-13 MED ORDER — LORAZEPAM 1 MG PO TABS
1.0000 mg | ORAL_TABLET | Freq: Three times a day (TID) | ORAL | Status: DC | PRN
  Administered 2014-12-13: 1 mg via ORAL
  Filled 2014-12-13: qty 1

## 2014-12-13 MED ORDER — NICOTINE 21 MG/24HR TD PT24
21.0000 mg | MEDICATED_PATCH | Freq: Every day | TRANSDERMAL | Status: DC
  Filled 2014-12-13: qty 1

## 2014-12-13 MED ORDER — HALOPERIDOL 5 MG PO TABS
5.0000 mg | ORAL_TABLET | Freq: Two times a day (BID) | ORAL | Status: DC
  Administered 2014-12-13 – 2014-12-14 (×3): 5 mg via ORAL
  Filled 2014-12-13 (×3): qty 1

## 2014-12-13 MED ORDER — LORAZEPAM 1 MG PO TABS
1.0000 mg | ORAL_TABLET | Freq: Once | ORAL | Status: AC
  Administered 2014-12-13: 1 mg via ORAL
  Filled 2014-12-13: qty 1

## 2014-12-13 NOTE — ED Provider Notes (Signed)
Pt escalating. Pressured speech. Pacing. Coming in and out of room repeatedly. Waving hands. Cannot reason with him at this point and only briefly able to redirect. Apparently seems to have been having hallucinations with behavior such as pointing and yelling at the wall. TTS is recommending inpatient treatment at this time. Cannot have patient pacing around the ED and yelling. Will chemically restrain.   Raeford RazorStephen Noreta Kue, MD 12/13/14 1415
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
## 2014-12-13 NOTE — ED Notes (Signed)
TTS consult being completed right now.

## 2014-12-13 NOTE — Progress Notes (Addendum)
[facility], per Ron.  Patient [facility] Dennie BiblePat, going through referrals right now, ok to refax. [facility]- per Thayer Ohmhris, referral is under review. Call back in am. [facility]- per Rinaldo CloudPamela, did not get a chance to go through referrals yet.  [facility] - per Alcario DroughtErica, fax referral, open adult male beds on 7/12. Fax went through at 22:12 on 7/12.  [facility]- per Aimme, call back in 1 h. Per Britt BoozerNicky, 1 adult male bed only. [facility] per Ron, call back at 8am for bed status.  CSW will continue to seek placement.  Melbourne Abtsatia Bless Lisenby, LCSWA Disposition staff 12/13/2014 9:52 PM

## 2014-12-13 NOTE — ED Notes (Signed)
Pt continues to exhibit psychotic behaviors (hallucinations, hitting self in head)  PRN medication to be given.

## 2014-12-13 NOTE — ED Notes (Addendum)
Called dietary to check on dinner tray.  Dietary confirms they received dinner order and should be delivered shortly. 

## 2014-12-13 NOTE — ED Notes (Signed)
Pt continued to yell and come out into hallway after speaking with MD.  Dahlia ClientHannah with CSW spoke with pt about plan of care.  Pt remained agitated and continued to raise voice and use profanity at staff.  Ordered medications given and security at bedside.  Pt voluntarily received Geodon.

## 2014-12-13 NOTE — ED Provider Notes (Signed)
CSN: [id number]     Arrival date & time 12/13/14  0004 History   First MD Initiated Contact with Patient 12/13/14 0141     Chief Complaint  Patient presents with  . Hallucinations  . Paranoid     (Consider location/radiation/quality/duration/timing/severity/associated sxs/prior Treatment) HPI Comments: 34 year old male with history of depression, schizophrenia, and bipolar 1 disorder presents to the emergency department for further evaluation of hallucinations and paranoia. Patient reports that he feels as though people are trying to harm him. He states that he has been feeling this way for the last few days. Patient was recently placed on a course of prednisone for an asthma exacerbation. He reports finishing this course of prednisone 2 days ago. He believes he has been on prednisone in the past. She denies having any similar symptoms of paranoia or hallucinations as a result of taking prednisone. He has no complaints of chest pain or shortness of breath at this time. No suicidal or homicidal ideations. Patient states "I don't think I need to be hospitalized, they may need to be observed for a few hours". He desires speaking with a counselor regarding his symptoms today.  The history is provided by the patient. No language interpreter was used.    Past Medical History  Diagnosis Date  . Depression   . Schizophrenia   . Bipolar 1 disorder    History reviewed. No pertinent past surgical history. Family History  Problem Relation Age of Onset  . Asthma Mother    History  Substance Use Topics  . Smoking status: Current Every Day Smoker -- 1.00 packs/day    Types: Cigarettes  . Smokeless tobacco: Not on file  . Alcohol Use: Yes    Review of Systems  Psychiatric/Behavioral: Positive for behavioral problems. Negative for suicidal ideas. The patient is nervous/anxious.   All other systems reviewed and are negative.   Allergies  Salami  Home Medications   Prior to Admission  medications   Medication Sig Start Date End Date Taking? Authorizing Provider  albuterol (PROVENTIL HFA;VENTOLIN HFA) 108 (90 BASE) MCG/ACT inhaler Inhale 2 puffs into the lungs every 4 (four) hours as needed for wheezing or shortness of breath. 12/07/14  Yes Marisa Severinlga Otter, MD  ondansetron (ZOFRAN ODT) 4 MG disintegrating tablet Take 1 tablet (4 mg total) by mouth every 8 (eight) hours as needed for nausea. 09/13/14  Yes Eber HongBrian Miller, MD   BP 101/84 mmHg  Pulse 82  Temp(Src) 98.1 F (36.7 C) (Oral)  Resp 16  Ht 5\' 11"  (1.803 m)  Wt 194 lb 2 oz (88.055 kg)  BMI 27.09 kg/m2  SpO2 96%   Physical Exam  Constitutional: He is oriented to person, place, and time. He appears well-developed and well-nourished. No distress.  Patient is very pleasant and polite  HENT:  Head: Normocephalic and atraumatic.  Eyes: Conjunctivae and EOM are normal. No scleral icterus.  Neck: Normal range of motion.  Pulmonary/Chest: Effort normal. No respiratory distress. He has wheezes. He has no rales.  Mild, diffuse, expiratory wheezing. No accessory muscle use or tachypnea  Musculoskeletal: Normal range of motion.  Neurological: He is alert and oriented to person, place, and time. He exhibits normal muscle tone. Coordination normal.  Skin: Skin is warm and dry. No rash noted. He is not diaphoretic. No erythema. No pallor.  Psychiatric: His behavior is normal. His mood appears anxious (mild). Thought content is paranoid. He expresses no homicidal and no suicidal ideation.  Mild stutter to speech  Nursing note and  vitals reviewed.   ED Course  Procedures (including critical care time) Labs Review Labs Reviewed  COMPREHENSIVE METABOLIC PANEL - Abnormal; Notable for the following:    Potassium 3.3 (*)    All other components within normal limits  CBC - Abnormal; Notable for the following:    Hemoglobin 12.9 (*)    HCT 38.0 (*)    All other components within normal limits  URINE RAPID DRUG SCREEN, HOSP PERFORMED  - Abnormal; Notable for the following:    Tetrahydrocannabinol POSITIVE (*)    All other components within normal limits  ETHANOL    Imaging Review No results found.   EKG Interpretation None      MDM   Final diagnoses:  Paranoid behavior    34 year old male presents to the emergency department for psychiatric evaluation. He has been medically cleared and evaluated by TTS. TTS recommend psychiatric evaluation in the morning for further evaluation of paranoid behavior and hallucinations. Psych holding orders placed. Patient has been resting in good condition all night; no complaints. Disposition to be determined by oncoming ED provider.   Filed Vitals:   12/13/14 0018  BP: 101/84  Pulse: 82  Temp: 98.1 F (36.7 C)  TempSrc: Oral  Resp: 16  Height:  (1.803 m)  Weight: 194 lb 2 oz (88.055 kg)  SpO2: 96%     Antony Madura, PA-C 12/13/14 0617  [name], MD 12/13/14 0740

## 2014-12-13 NOTE — ED Notes (Signed)
Pt. wanded by security at triage , pt. wearing paper scrubs.

## 2014-12-13 NOTE — ED Notes (Signed)
Staffing called for safety sitter for pt.

## 2014-12-13 NOTE — ED Notes (Signed)
Breakfast tray order .

## 2014-12-13 NOTE — ED Notes (Signed)
MD at bedside. 

## 2014-12-13 NOTE — ED Notes (Signed)
Pt is still agitated and sts "Is there not a doctor I can talk to because I specifically told them I had stuff to do todayJuleen China!"  Kohut MD made aware and will come see pt.

## 2014-12-13 NOTE — Progress Notes (Signed)
Seeking inpt psychiatric placement for pt.  Ref[facility]- per Alinda Moneyony (for waitlist) [facility]- per Coy Saunasosemary  All other facilities contacted at capacity for adult male beds today.  Ilean SkillMeghan Nicklous Aburto, MSW, LCSWA Clinical Social Work, Disposition  12/13/2014 [phone]

## 2014-12-13 NOTE — ED Notes (Signed)
Pt. requesting psychiatric evaluation for his paranoid behavior and auditory hallucinations onset this week . Denies suicidal ideation.

## 2014-12-13 NOTE — ED Notes (Signed)
Pt is very agitated at this time and is very repetitive in his speech.  Pt sts "I clearly told that doctor that I had stuff to do today and I just needed to be observed for a couple hours not all dang day!"  Pt is very hostile and restless at this time.  Kohut MD made aware and further orders received.
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
## 2014-12-13 NOTE — BH Assessment (Signed)
Reviewed ED notes prior to initiating assessment. Per notes pt reports he came to ED for evaluation due AH with onset a week ago. [facility]/15. Per Dr. Elvera MariaEappen's notes [facility] pt appeared to be malingering to get Klonopins, and had been prescribed Klonopin by multiple providers in quick succession in the fall and winter of 2015.   Requested cart be placed with pt for assessment.    Assessment to commence shortly.    Clista BernhardtNancy Anael Rosch, [facility] Triage Specialist 12/13/2014 2:06 AM

## 2014-12-13 NOTE — ED Notes (Signed)
Safety sitter is at bedside. 

## 2014-12-13 NOTE — ED Notes (Signed)
Pt was observed on room camera talking to self, hitting himself in the head and pointing to things in the room that weren't there.  RN went into room to speak with pt and pt denies auditory or visual hallucinations.  Pt sts "I was just talking to myself trying to remember something and hitting myself in the head is just a habit.  I'm not trying to hurt myself or anything."  RN deescalated pt and pt is now calm and cooperative.  TTS consult being conducted at this time.  Findings shared with [place] PA.

## 2014-12-14 MED ORDER — BENZTROPINE MESYLATE 0.5 MG PO TABS
0.5000 mg | ORAL_TABLET | Freq: Two times a day (BID) | ORAL | Status: DC
Start: 2014-12-14 — End: 2015-01-27

## 2014-12-14 MED ORDER — HALOPERIDOL 5 MG PO TABS: 5.0000 mg | ORAL_TABLET | Freq: Two times a day (BID) | ORAL | Status: DC

## 2014-12-14 NOTE — ED Notes (Signed)
Pt talking to and being reevaluated by TTS.

## 2014-12-14 NOTE — ED Notes (Signed)
Gave pt Sprite, per [facility]-RN

## 2014-12-14 NOTE — ED Notes (Signed)
Pt became wide awake all of a sudden; pt at nursing stations concerned why he has not been able to go home; pt advised we are looking for placement for him; pt was not happy with answer but was able to be distracted and went back into his room;pt calm at this time

## 2014-12-14 NOTE — ED Notes (Signed)
Behavioral Health SW- Megan notified RN that [facility]. RN discussed with patient his options. Pt wants to be discharged. RN awaiting MD note, releasing pt.

## 2014-12-14 NOTE — ED Notes (Signed)
Pt stating that he is not SI or HI and wondering why he is "being kept here".

## 2014-12-14 NOTE — ED Notes (Signed)
Pt continues to come to nurses station concerning why he is still here; Pt growing more agitated. Pt redirected back to room; pt told Md will reevaluate later this morning and last know [facility] was [facility] were still looking for [facility].

## 2014-12-14 NOTE — Discharge Instructions (Signed)
Paranoia °Paranoia is a distrust of others that is not based on a real reason for distrust. This may reach delusional levels. This means the paranoid person feels the world is against them when there is no reason to make them feel that way. People with paranoia feel as though people around them are "out to get them".  °SIMILAR MENTAL ILNESSES °· Depression is a feeling as though you are down all the time. It is normal in some situations where you have just lost a loved one. It is abnormal if you are having feelings of paranoia with it. °· Dementia is a physical problem with the brain in which the brain no longer works properly. There are problems with daily activities of living. Alzheimer's disease is one example of this. Dementia is also caused by old age changes in the brain which come with the death of brain cells and small strokes. °· Paranoidschizophrenia. People with paranoid schizophrenia and persecutory delusional disorder have delusions in which they feel people around them are plotting against them. Persecutory delusions in paranoid schizophrenia are bizarre, sometimes grandiose, and often accompanied by auditory hallucinations. This means the person is hearing voices that are not there. °· Delusionaldisorder (persecutory type). Delusions experienced by individuals with delusional disorder are more believable than those experienced by paranoid schizophrenics; they are not bizarre, though still unjustified. Individuals with delusional disorder may seem offbeat or quirky rather than mentally ill, and therefore, may never seek treatment. °All of these problems usually do not allow these people to interact socially in an acceptable manner. °CAUSES °The cause of paranoia is often not known. It is common in people with extended abuse of: °· Cocaine. °· Amphetamine. °· Marijuana. °· Alcohol. °Sometimes there is an inherited tendency. It may be associated with stress or changes in brain chemistry. °DIAGNOSIS    °When paranoia is present, your caregiver may: °· Refer you to a specialist. °· Do a physical exam. °· Perform other tests on you to make sure there are not other problems causing the paranoia including: °¨ Physical problems. °¨ Mental problems. °¨ Chemical problems (other than drugs). °Testing may be done to determine if there is a psychiatric disability present that can be treated with medicine. °TREATMENT  °· Paranoia that is a symptom of a psychiatric problem should be treated by professionals. °· Medicines are available which can help this disorder. Antipsychotic medicine may be prescribed by your caregiver. °· Sometimes psychotherapy may be useful. °· Conditions such as depression or drug abuse are treated individually. If the paranoia is caused by drug abuse, a treatment facility may be helpful. Depression may be helped by antidepressants. °PROGNOSIS  °· Paranoid people are difficult to treat because of their belief that everyone is out to get them or harm them. Because of this mistrust, they often must be talked into entering treatment by a trusted family member or friend. They may not want to take medicine as they may see this as an attempt to poison them. °· Gradual gains in the trust of a therapist or caregiver helps in a successful treatment plan. °· Some people with PPD or persecutory delusional disorder function in society without treatment in limited fashion. °Document Released: 05/23/2003 Document Revised: 08/12/2011 Document Reviewed: 01/26/2008 °ExitCare® Patient Information ©2015 [employer]. This information is not intended to replace advice given to you by your health care provider. Make sure you discuss any questions you have with your health care provider. ° ° ° °Emergency Department Resource Guide °1) Find a   Doctor and Pay Out of Pocket °Although you won't have to find out who is covered by your insurance plan, it is a good idea to ask around and get recommendations. You will then need to  call the office and see if the doctor you have chosen will accept you as a new patient and what types of options they offer for patients who are self-pay. Some doctors offer discounts or will set up payment plans for their patients who do not have insurance, but you will need to ask so you aren't surprised when you get to your appointment. ° °2) Contact Your Local Health Department °Not all health departments have doctors that can see patients for sick visits, but many do, so it is worth a call to see if yours does. If you don't know where your local health department is, you can check in your phone book. The CDC also has a tool to help you locate your state's health department, and many state websites also have listings of all of their local health departments. ° °3) Find a Walk-in Clinic °If your illness is not likely to be very severe or complicated, you may want to try a walk in clinic. These are popping up all over the country in pharmacies, drugstores, and shopping centers. They're usually staffed by nurse practitioners or physician assistants that have been trained to treat common illnesses and complaints. They're usually fairly quick and inexpensive. However, if you have serious medical issues or chronic medical problems, these are probably not your best option. ° °No Primary Care Doctor: °- Call [facility] at  832-8000 - they can help you locate a primary care doctor that  accepts your insurance, provides certain services, etc. °- Physician Referral Service- 1-800-533-3463 ° °Chronic Pain Problems: °Organization         Address  Phone   Notes  °[facility]  (336) 297-2271 Patients need to be referred by their primary care doctor.  ° °Medication Assistance: °Organization         Address  Phone   Notes  °[place] [facility] [address] °[place], [place] [address] (336) 641-8030 --Must be a resident of [place] °-- Must have NO insurance  coverage whatsoever (no Medicaid/ Medicare, etc.) °-- The pt. MUST have a primary care doctor that directs their care regularly and follows them in the community °  °[facility]  (866) 331-1348   °[employer]  (888) 892-1162   ° °Agencies that provide inexpensive medical care: °Organization         Address  Phone   Notes  °[facility]  (336) 832-8035   °[facility]    (336) 832-7272   °[facility] [address] °[place], [place] [address] (336) 832-4777   °[facility] [address], °[place] (336) 271-4999   °[facility]    (336) 373-0678   °[facility]    (336) 272-1050   °[facility] ° [address], [place] Phone:  (336) 832-4444, Fax:  (336) 832-4440 Hours of Operation:  9 am - 6 pm, M-F.  Also accepts Medicaid/Medicare and self-pay.  °[facility] ° [address], [place] Phone: (336) 832-3150, Fax: (336) 832-3151. Hours of Operation:  8:30 am - 5:30 pm, M-F.  Also accepts Medicaid and self-pay.  °[facility] [place] [address], [place] Phone: (336)   878-6027   °[facility] [address], [place], [place] (336)723-1848, Ext. 123 Mondays & Thursdays: 7-9 AM.  First 15 patients are seen on a first come, first serve basis. °  ° °Medicaid-accepting [place] Providers: ° °Organization         Address  Phone   Notes  °[facility] [address], [place] (336) 641-2100 Also accepts self-pay patients.  °[facility] [address], [place] ° (336) 856-9996   °[facility] [address] [place] [address], [place] (336) 288-8857   °[facility] [address] [place] [address], [place] (336) 299-7000   °Veita Bland [address], [place]  ° (336) 373-1557 Only accepts [place] Access Medicaid patients after they have their  name applied to their card.  ° °Self-Pay (no insurance) in [place]: ° °Organization         Address  Phone   Notes  °Sickle Cell Patients, [facility] [address], [place] (336) 832-1970   °[facility] [address], [place] (336) 832-4400   °[facility] ° [address], [place] (336) 992-4800   °[facility]/Dr. Osei-Bonsu ° [address] [place] [address], [place] or [address], [place] (336) 841-8500 Phone number for both [place] and [place] locations is the same.  °[facility] [address] [address], [place] (336) 299-0000   °[facility] [address] [place] [address], [place] or [address] (336) 852-7530 °(336) 878-2260   °[facility] [address], [place] (336) 350-1642, phone; (336) 294-5005, fax Sees patients 1st and 3rd Saturday of every month.  Must not qualify for public or private insurance (i.e. Medicaid, Medicare, [place] Health Choice, Veterans' Benefits) • Household income should be no more than 200% of the poverty level •The clinic cannot treat you if you are pregnant or think you are pregnant • Sexually transmitted diseases are not treated at the clinic.  ° ° °Dental Care: °Organization         Address  Phone  Notes  °[facility] [address], [place] (336) 641-6152 Accepts children up to age 21 who are enrolled in Medicaid or [place] Health Choice; pregnant women with a Medicaid card; and children who have applied for Medicaid or [place] Health Choice, but were declined, whose parents can pay a reduced fee at time of service.  °[facility] [place]  [address], [place] (336) 641-7733 Accepts children up to age 21 who are enrolled in Medicaid or [place] Health Choice; pregnant women with a Medicaid card; and children who have applied for  Medicaid or  Health Choice, but were declined, whose parents can pay a reduced fee at time of service.  °[facility] ° [address], [place] (336) 641-4533 Patients are seen by appointment only. Walk-ins are not accepted. [facility] will see patients 18 years of age and older. °Monday - Tuesday (8am-5pm) °Most Wednesdays (8:30-5pm) °$30 per visit, cash only  °[facility] ° [address], [place] (336) 641-4533 Patients are seen by appointment only. Walk-ins are not accepted. [facility] will see patients 18 years of age and older. °One Wednesday Evening (Monthly: Volunteer Based).  $  30 per visit, cash only  °[facility]  (919) 537-3737 for adults; Children under age 4, call Graduate Pediatric Dentistry at ([phone]) [phone]. Children aged 4-14, please call ([phone]) [phone] to request a pediatric application. ° Dental services are provided in all areas of dental care including fillings, crowns and bridges, complete and partial dentures, implants, gum treatment, root canals, and extractions. Preventive care is also provided. Treatment is provided to both adults and children. °Patients are selected via a lottery and there is often a waiting list. °  °[facility] [address], °[place] ° (336) 763-8833 [email or web address] °  °[facility] [address], [place], [place] (336)723-1848, Ext. 123 Second and Fourth Thursday of each month, opens at 6:30 AM; Clinic ends at 9 AM.  Patients are seen on a first-come first-served basis, and a limited number are seen during each clinic.  ° °[facility] ° [address], [place], [place] (336) 723-7904   Eligibility Requirements °You must have lived in [place], [place], or [place] counties for at least the last three months. °  You cannot be eligible for state or federal sponsored healthcare insurance, including Veterans Administration, Medicaid,  or Medicare. °  You generally cannot be eligible for healthcare insurance through your employer.  °  How to apply: °Eligibility screenings are held every Tuesday and Wednesday afternoon from 1:00 pm until 4:00 pm. You do not need an appointment for the interview!  °[facility] [address], [place], [place] 336-631-2330   °[facility]  336-342-8273   °[facility]  336-703-3100   °[facility]  336-570-6415   ° °Behavioral Health Resources in the Community: °Intensive Outpatient Programs °Organization         Address  Phone  Notes  °[facility] [address], [place], [place] 336-878-6098   °[facility] [address], [place], [place] 336-832-9800   °[facility] [address], [place], [place] ° 336-882-2125   °[facility] [address],  °[place], [place] 1-800-853-5163 or 336-641-4981   °Substance Abuse Resources °Organization         Address  Phone  Notes  °[facility]  336-882-2125   °[facility]  336-784-9470   °[facility]  336-285-9073   °[facility]  336-845-3988   °Residential & Outpatient Substance Abuse Program  1-800-659-3381   °Psychological Services °Organization         Address  Phone  Notes  °[facility]  336- 832-9600   °[facility]  336- 378-7881   °[facility] [address], [place] 1-800-853-5163 or 336-641-4981   ° °Mobile Crisis Teams °Organization         Address  Phone  Notes  °[facility], Mobile Crisis Care Unit  1-877-626-1772   °Assertive °[facility] ° [address] [place], [place] 336-834-9664   °Sharon DeEsch [address] °[place]  336-554-5454   ° °Self-Help/Support Groups °Organization         Address  Phone             Notes  °[facility] - variety of support groups   336- 373-1402 Call for more information  °Narcotics Anonymous (NA), [facility] [address], °[place]   2 meetings at this location  ° °Residential Treatment Programs °Organization           Address  Phone  Notes  °[facility] [address],    °[place]  1-866-801-8205   °[facility] ° [address], [place], [place] 704-293-8524   °[facility] [address], [place] 336-845-3988 Admissions: 8am-3pm M-F  °[facility] [address]    °[place], [place] 336-841-1104   °[facility] [address], [place], [place] 336-379-7146   °[facility] [address]  °[place], [place] 336-285-9073   °[facility] - Intensive Outpatient [address], [place], [place] 336-852-3033   °[facility] [address]  °[place], [place] 1-877-615-2722 or 336-784-9470   °[facility] [address] [place], [place] 336-227-7417 Accepts Medicaid  °[facility] [address]  ° [place] 1-800-659-3381 Substance Abuse/Addiction Treatment  ° °[place] Behavioral Health Resources °Organization         Address  Phone  Notes  °[facility]  (888) 581-9988   °Julie Brannon, PhD [address] [place], [place]   (336) 349-5553 or (336) 951-0000   °[facility]   [address] °[place], [place] (336) 349-4454   °[facility] [address], [place], [place] (336) 342-8316 Insurance/Medicaid/sponsorship through [facility]  °[facility] [address]                                    [place], [place] (336) 342-8316 Therapy/tele-psych/case  °[facility] [address]  ° [place], [place] (336) 349-2233    °Dr. Arfeen  (336) 349-4544   °[facility] [place]  [employer] [place] [facility] 1) [address],  °2) [address], [place] °3)  [address], [place] (336) 349-3220 °(336) 342-7768 ° °(336) 342-8140   °[place] [facility] (336) 342-1394 or (336) 342-3537 (After Hours)    ° ° ° °

## 2014-12-14 NOTE — Progress Notes (Signed)
Spoke with C. Withrow, DNP, who has evaluated pt. States pt may benefit from inpatient treatment, however, does not meet IVC criteria at this time. CSW had received call from [facility] stating pt could be accepted, however, pt was offeUticared voluntary placement and declines. Per DNP, d/c pt to follow up with his outpatient provider [facility](Family Services).  Ilean SkillMeghan Olegario Emberson, MSW, LCSWA Clinical Social Work, Disposition  12/14/2014 [phone]
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
## 2014-12-14 NOTE — ED Notes (Signed)
Confirmed that patients items are in safe. Pt wallet, pocket knife, and hammer in safe.

## 2014-12-14 NOTE — ED Notes (Signed)
[employer] called, taxi Customer service managervolcher given to Insurance claims handlertriage nurse.

## 2015-01-05 ENCOUNTER — Emergency Department (HOSPITAL_COMMUNITY): Payer: Medicaid Other

## 2015-01-05 ENCOUNTER — Emergency Department (HOSPITAL_COMMUNITY)
Admission: EM | Admit: 2015-01-05 | Discharge: 2015-01-05 | Disposition: A | Discharge status: Home / Self Care | Payer: Medicaid Other | Attending: Emergency Medicine | Admitting: Emergency Medicine

## 2015-01-05 ENCOUNTER — Encounter (HOSPITAL_COMMUNITY): Payer: Self-pay

## 2015-01-05 DIAGNOSIS — Z720 Tobacco use: Secondary | ICD-10-CM | POA: Insufficient documentation

## 2015-01-05 DIAGNOSIS — R05 Cough: Secondary | ICD-10-CM | POA: Diagnosis present

## 2015-01-05 DIAGNOSIS — Z8659 Personal history of other mental and behavioral disorders: Secondary | ICD-10-CM | POA: Insufficient documentation

## 2015-01-05 DIAGNOSIS — Z79899 Other long term (current) drug therapy: Secondary | ICD-10-CM | POA: Insufficient documentation

## 2015-01-05 DIAGNOSIS — R1110 Vomiting, unspecified: Secondary | ICD-10-CM | POA: Diagnosis not present

## 2015-01-05 DIAGNOSIS — J45901 Unspecified asthma with (acute) exacerbation: Secondary | ICD-10-CM | POA: Diagnosis not present

## 2015-01-05 HISTORY — DX: Unspecified asthma, uncomplicated: J45.909

## 2015-01-05 LAB — I-STAT CHEM 8, ED
BUN: 10 mg/dL (ref 6–20)
CALCIUM ION: 1.14 mmol/L (ref 1.12–1.23)
CREATININE: 0.9 mg/dL (ref 0.61–1.24)
Chloride: 97 mmol/L — ABNORMAL LOW (ref 101–111)
Glucose, Bld: 124 mg/dL — ABNORMAL HIGH (ref 65–99)
HCT: 46 % (ref 39.0–52.0)
HEMOGLOBIN: 15.6 g/dL (ref 13.0–17.0)
POTASSIUM: 3.6 mmol/L (ref 3.5–5.1)
SODIUM: 137 mmol/L (ref 135–145)
TCO2: 26 mmol/L (ref 0–100)

## 2015-01-05 MED ORDER — IPRATROPIUM BROMIDE 0.02 % IN SOLN
0.5000 mg | Freq: Once | RESPIRATORY_TRACT | Status: AC
  Administered 2015-01-05: 0.5 mg via RESPIRATORY_TRACT
  Filled 2015-01-05: qty 2.5

## 2015-01-05 MED ORDER — BENZONATATE 100 MG PO CAPS: 100.0000 mg | ORAL_CAPSULE | Freq: Three times a day (TID) | ORAL | Status: DC | PRN

## 2015-01-05 MED ORDER — ONDANSETRON 4 MG PO TBDP
8.0000 mg | ORAL_TABLET | Freq: Once | ORAL | Status: AC
  Administered 2015-01-05: 8 mg via ORAL
  Filled 2015-01-05: qty 2

## 2015-01-05 MED ORDER — ONDANSETRON 4 MG PO TBDP: ORAL_TABLET | ORAL | Status: DC

## 2015-01-05 MED ORDER — ALBUTEROL SULFATE HFA 108 (90 BASE) MCG/ACT IN AERS: 2.0000 | INHALATION_SPRAY | RESPIRATORY_TRACT | Status: DC | PRN

## 2015-01-05 MED ORDER — PREDNISONE 20 MG PO TABS: 60.0000 mg | ORAL_TABLET | Freq: Every day | ORAL | Status: AC

## 2015-01-05 MED ORDER — PREDNISONE 20 MG PO TABS
60.0000 mg | ORAL_TABLET | Freq: Once | ORAL | Status: AC
  Administered 2015-01-05: 60 mg via ORAL
  Filled 2015-01-05: qty 3

## 2015-01-05 MED ORDER — ALBUTEROL SULFATE HFA 108 (90 BASE) MCG/ACT IN AERS
4.0000 | INHALATION_SPRAY | Freq: Once | RESPIRATORY_TRACT | Status: AC
  Administered 2015-01-05: 4 via RESPIRATORY_TRACT
  Filled 2015-01-05: qty 6.7

## 2015-01-05 MED ORDER — ALBUTEROL SULFATE (2.5 MG/3ML) 0.083% IN NEBU
5.0000 mg | INHALATION_SOLUTION | Freq: Once | RESPIRATORY_TRACT | Status: AC
Start: 2015-01-05 — End: 2015-01-05
  Administered 2015-01-05: 5 mg via RESPIRATORY_TRACT
  Filled 2015-01-05: qty 6

## 2015-01-05 NOTE — ED Provider Notes (Signed)
CSN: [id number]     Arrival date & time 01/05/15  1324 History   First MD Initiated Contact with Patient 01/05/15 1512     No chief complaint on file.    (Consider location/radiation/quality/duration/timing/severity/associated sxs/prior Treatment) HPI  34 year old male presents with cough, congestion, and wheezing over the past 6 days. Patient states he has a history of asthma and bronchitis and this feels similar. No fevers. His cough is productive of clear sputum. Occasionally he gets short of breath when he walks. No chest pain. Does not currently have any albuterol. Patient states she's also been having nasal congestion although this has improved. He had watery eyes that has resolved but is still sneezing. Has been trying over-the-counter remedies with no relief. Patient has also been having posttussive emesis over the past 6 days. Denies any abdominal pain or nausea at this time. While at rest, no trouble breathing.  Past Medical History  Diagnosis Date  . Depression   . Schizophrenia   . Bipolar 1 disorder   . Asthma    History reviewed. No pertinent past surgical history. Family History  Problem Relation Age of Onset  . Asthma Mother    History  Substance Use Topics  . Smoking status: Current Every Day Smoker -- 1.00 packs/day    Types: Cigarettes  . Smokeless tobacco: Not on file  . Alcohol Use: Yes    Review of Systems  Constitutional: Negative for fever.  HENT: Positive for congestion and sneezing.   Respiratory: Positive for cough, shortness of breath and wheezing.   Gastrointestinal: Positive for vomiting. Negative for nausea and abdominal pain.  All other systems reviewed and are negative.     Allergies  Salami  Home Medications   Prior to Admission medications   Medication Sig Start Date End Date Taking? Authorizing Provider  albuterol (PROVENTIL HFA;VENTOLIN HFA) 108 (90 BASE) MCG/ACT inhaler Inhale 2 puffs into the lungs every 4 (four) hours as needed  for wheezing or shortness of breath. 12/07/14   [name], MD  benztropine (COGENTIN) 0.5 MG tablet Take 1 tablet (0.5 mg total) by mouth 2 (two) times daily. 12/14/14   Raeford Razor, MD  haloperidol (HALDOL) 5 MG tablet Take 1 tablet (5 mg total) by mouth 2 (two) times daily. 12/14/14   Raeford Razor, MD  ondansetron (ZOFRAN ODT) 4 MG disintegrating tablet Take 1 tablet (4 mg total) by mouth every 8 (eight) hours as needed for nausea. 09/13/14   Eber Hong, MD   BP 113/63 mmHg  Pulse 110  Temp(Src) 98 F (36.7 C) (Oral)  Resp 18  SpO2 98% Physical Exam  Constitutional: He is oriented to person, place, and time. He appears well-developed and well-nourished.  Speaks in complete sentences  HENT:  Head: Normocephalic and atraumatic.  Right Ear: External ear normal.  Left Ear: External ear normal.  Nose: Nose normal.  Eyes: Right eye exhibits no discharge. Left eye exhibits no discharge.  Neck: Neck supple.  Cardiovascular: Normal rate, regular rhythm, normal heart sounds and intact distal pulses.   Pulmonary/Chest: Effort normal. He has wheezes (diffuse expiratory wheezes).  Abdominal: Soft. He exhibits no distension. There is no tenderness.  Musculoskeletal: He exhibits no edema.  Neurological: He is alert and oriented to person, place, and time.  Skin: Skin is warm and dry.  Nursing note and vitals reviewed.   ED Course  Procedures (including critical care time) Labs Review Labs Reviewed  I-STAT CHEM 8, ED - Abnormal; Notable for the following:  Chloride 97 (*)    Glucose, Bld 124 (*)    All other components within normal limits    Imaging Review Dg Chest 2 View  01/05/2015   CLINICAL DATA:  Six day history of productive cough. Intermittent shortness of breath, nasal congestion. Wheezing and watery eyes. Vomiting for 6 days.  EXAM: CHEST  2 VIEW  COMPARISON:  None.  FINDINGS: Heart size is normal. There is mild perihilar peribronchial thickening. There are no focal  consolidations or pleural effusions. No pulmonary edema.  IMPRESSION: 1. Bronchitic changes. 2.  No focal acute pulmonary abnormality.   Electronically Signed   By: Norva Pavlov M.D.   On: 01/05/2015 14:02     EKG Interpretation None      MDM   Final diagnoses:  Asthma exacerbation    Patient symptoms are consistent with an upper respiratory infection leading to an asthma exacerbation. He is well appearing at this time with no hypoxia, normal work of breathing, and speaks in full sentences. His heart rate declined into the 80s without any treatment. Given his well appearance managed as an outpatient with albuterol, steroid burst, and symptomatically treatment. His nausea/vomiting is most likely from post tussive. Labwork is unremarkable. Will discharge and recommend follow with PCP.    Pricilla Loveless, MD 01/05/15 [phone]

## 2015-01-05 NOTE — ED Notes (Addendum)
Pt presents with 6 day h/o productive cough (clear phlegm).  Pt reports intermittent shortness of breath, nasal congestion that is clear.  Pt reports sneezing and watery eyes.  Pt ran out of albuterol, reports wheezing.  Pt reports also reports vomiting x 6 days ago; denies any nausea.  Pt reports intermittent abdominal pain - denies at present.

## 2015-01-05 NOTE — Discharge Instructions (Signed)
Asthma, Acute Bronchospasm °Acute bronchospasm caused by asthma is also referred to as an asthma attack. Bronchospasm means your air passages become narrowed. The narrowing is caused by inflammation and tightening of the muscles in the air tubes (bronchi) in your lungs. This can make it hard to breathe or cause you to wheeze and cough. °CAUSES °Possible triggers are: °· Animal dander from the skin, hair, or feathers of animals. °· Dust mites contained in house dust. °· Cockroaches. °· Pollen from trees or grass. °· Mold. °· Cigarette or tobacco smoke. °· Air pollutants such as dust, household cleaners, hair sprays, aerosol sprays, paint fumes, strong chemicals, or strong odors. °· Cold air or weather changes. Cold air may trigger inflammation. Winds increase molds and pollens in the air. °· Strong emotions such as crying or laughing hard. °· Stress. °· Certain medicines such as aspirin or beta-blockers. °· Sulfites in foods and drinks, such as dried fruits and wine. °· Infections or inflammatory conditions, such as a flu, cold, or inflammation of the nasal membranes (rhinitis). °· Gastroesophageal reflux disease (GERD). GERD is a condition where stomach acid backs up into your esophagus. °· Exercise or strenuous activity. °SIGNS AND SYMPTOMS  °· Wheezing. °· Excessive coughing, particularly at night. °· Chest tightness. °· Shortness of breath. °DIAGNOSIS  °Your health care provider will ask you about your medical history and perform a physical exam. A chest X-ray or blood testing may be performed to look for other causes of your symptoms or other conditions that may have triggered your asthma attack.  °TREATMENT  °Treatment is aimed at reducing inflammation and opening up the airways in your lungs.  Most asthma attacks are treated with inhaled medicines. These include quick relief or rescue medicines (such as bronchodilators) and controller medicines (such as inhaled corticosteroids). These medicines are sometimes  given through an inhaler or a nebulizer. Systemic steroid medicine taken by mouth or given through an IV tube also can be used to reduce the inflammation when an attack is moderate or severe. Antibiotic medicines are only used if a bacterial infection is present.  °HOME CARE INSTRUCTIONS  °· Rest. °· Drink plenty of liquids. This helps the mucus to remain thin and be easily coughed up. Only use caffeine in moderation and do not use alcohol until you have recovered from your illness. °· Do not smoke. Avoid being exposed to secondhand smoke. °· You play a critical role in keeping yourself in good health. Avoid exposure to things that cause you to wheeze or to have breathing problems. °· Keep your medicines up-to-date and available. Carefully follow your health care provider's treatment plan. °· Take your medicine exactly as prescribed. °· When pollen or pollution is bad, keep windows closed and use an air conditioner or go to places with air conditioning. °· Asthma requires careful medical care. See your health care provider for a follow-up as advised. If you are more than [redacted] weeks pregnant and you were prescribed any new medicines, let your obstetrician know about the visit and how you are doing. Follow up with your health care provider as directed. °· After you have recovered from your asthma attack, make an appointment with your outpatient doctor to talk about ways to reduce the likelihood of future attacks. If you do not have a doctor who manages your asthma, make an appointment with a primary care doctor to discuss your asthma. °SEEK IMMEDIATE MEDICAL CARE IF:  °· You are getting worse. °· You have trouble breathing. If severe, call your local   emergency services (911 in the [place]  You develop chest pain or discomfort.  You are vomiting.  You are not able to keep fluids down.  You are coughing up yellow, green, brown, or bloody sputum.  You have a fever and your symptoms suddenly get worse.  You have  trouble swallowing. MAKE SURE YOU:   Understand these instructions.  Will watch your condition.  Will get help right away if you are not doing well or get worse. Document Released: 09/04/2006 Document Revised: 05/25/2013 Document Reviewed: 11/25/2012 [facility] Patient Information 2015 [place], [place]. This information is not intended to replace advice given to you by your health care provider. Make sure you discuss any questions you have with your health care provider.    Nausea and Vomiting Nausea is a sick feeling that often comes before throwing up (vomiting). Vomiting is a reflex where stomach contents come out of your mouth. Vomiting can cause severe loss of body fluids (dehydration). Children and elderly adults can become dehydrated quickly, especially if they also have diarrhea. Nausea and vomiting are symptoms of a condition or disease. It is important to find the cause of your symptoms. CAUSES   Direct irritation of the stomach lining. This irritation can result from increased acid production (gastroesophageal reflux disease), infection, food poisoning, taking certain medicines (such as nonsteroidal anti-inflammatory drugs), alcohol use, or tobacco use.  Signals from the brain.These signals could be caused by a headache, heat exposure, an inner ear disturbance, increased pressure in the brain from injury, infection, a tumor, or a concussion, pain, emotional stimulus, or metabolic problems.  An obstruction in the gastrointestinal tract (bowel obstruction).  Illnesses such as diabetes, hepatitis, gallbladder problems, appendicitis, kidney problems, cancer, sepsis, atypical symptoms of a heart attack, or eating disorders.  Medical treatments such as chemotherapy and radiation.  Receiving medicine that makes you sleep (general anesthetic) during surgery. DIAGNOSIS Your caregiver may ask for tests to be done if the problems do not improve after a few days. Tests may also be done if  symptoms are severe or if the reason for the nausea and vomiting is not clear. Tests may include:  Urine tests.  Blood tests.  Stool tests.  Cultures (to look for evidence of infection).  X-rays or other imaging studies. Test results can help your caregiver make decisions about treatment or the need for additional tests. TREATMENT You need to stay well hydrated. Drink frequently but in small amounts.You may wish to drink water, sports drinks, clear broth, or eat frozen ice pops or gelatin dessert to help stay hydrated.When you eat, eating slowly may help prevent nausea.There are also some antinausea medicines that may help prevent nausea. HOME CARE INSTRUCTIONS   Take all medicine as directed by your caregiver.  If you do not have an appetite, do not force yourself to eat. However, you must continue to drink fluids.  If you have an appetite, eat a normal diet unless your caregiver tells you differently.  Eat a variety of complex carbohydrates (rice, wheat, potatoes, bread), lean meats, yogurt, fruits, and vegetables.  Avoid high-fat foods because they are more difficult to digest.  Drink enough water and fluids to keep your urine clear or pale yellow.  If you are dehydrated, ask your caregiver for specific rehydration instructions. Signs of dehydration may include:  Severe thirst.  Dry lips and mouth.  Dizziness.  Dark urine.  Decreasing urine frequency and amount.  Confusion.  Rapid breathing or pulse. SEEK IMMEDIATE MEDICAL CARE IF:  You have blood or brown flecks (like coffee grounds) in your vomit.  You have black or bloody stools.  You have a severe headache or stiff neck.  You are confused.  You have severe abdominal pain.  You have chest pain or trouble breathing.  You do not urinate at least once every 8 hours.  You develop cold or clammy skin.  You continue to vomit for longer than 24 to 48 hours.  You have a fever. MAKE SURE YOU:    Understand these instructions.  Will watch your condition.  Will get help right away if you are not doing well or get worse. Document Released: 05/20/2005 Document Revised: 08/12/2011 Document Reviewed: 10/17/2010 [facility] Patient Information 2015 [place], [place]. This information is not intended to replace advice given to you by your health care provider. Make sure you discuss any questions you have with your health care provider.

## 2015-01-05 NOTE — ED Notes (Signed)
Pt verbalizes understanding of d/c instructions and denies any further need at this time. 

## 2015-01-22 ENCOUNTER — Emergency Department (HOSPITAL_COMMUNITY)
Admission: EM | Admit: 2015-01-22 | Discharge: 2015-01-22 | Disposition: A | Discharge status: Other Acute Inpatient Hospital | Payer: Medicaid Other | Attending: Emergency Medicine | Admitting: Emergency Medicine

## 2015-01-22 ENCOUNTER — Encounter (HOSPITAL_COMMUNITY): Payer: Self-pay | Admitting: Emergency Medicine

## 2015-01-22 ENCOUNTER — Emergency Department (EMERGENCY_DEPARTMENT_HOSPITAL)
Admission: EM | Admit: 2015-01-22 | Discharge: 2015-01-24 | Disposition: A | Discharge status: Other Acute Inpatient Hospital | Payer: Medicaid Other | Source: Home / Self Care | Attending: Emergency Medicine | Admitting: Emergency Medicine

## 2015-01-22 DIAGNOSIS — J45909 Unspecified asthma, uncomplicated: Secondary | ICD-10-CM | POA: Insufficient documentation

## 2015-01-22 DIAGNOSIS — F1220 Cannabis dependence, uncomplicated: Secondary | ICD-10-CM | POA: Diagnosis present

## 2015-01-22 DIAGNOSIS — Z91148 Patient's other noncompliance with medication regimen for other reason: Secondary | ICD-10-CM

## 2015-01-22 DIAGNOSIS — Z79899 Other long term (current) drug therapy: Secondary | ICD-10-CM | POA: Diagnosis not present

## 2015-01-22 DIAGNOSIS — R45850 Homicidal ideations: Secondary | ICD-10-CM

## 2015-01-22 DIAGNOSIS — Z720 Tobacco use: Secondary | ICD-10-CM | POA: Diagnosis not present

## 2015-01-22 DIAGNOSIS — F250 Schizoaffective disorder, bipolar type: Secondary | ICD-10-CM | POA: Insufficient documentation

## 2015-01-22 DIAGNOSIS — F22 Delusional disorders: Secondary | ICD-10-CM

## 2015-01-22 DIAGNOSIS — F419 Anxiety disorder, unspecified: Secondary | ICD-10-CM

## 2015-01-22 DIAGNOSIS — Z9114 Patient's other noncompliance with medication regimen: Secondary | ICD-10-CM

## 2015-01-22 LAB — COMPREHENSIVE METABOLIC PANEL
ALK PHOS: 63 U/L (ref 38–126)
ALT: 24 U/L (ref 17–63)
AST: 30 U/L (ref 15–41)
Albumin: 4.3 g/dL (ref 3.5–5.0)
Anion gap: 6 (ref 5–15)
BUN: 12 mg/dL (ref 6–20)
CALCIUM: 9.1 mg/dL (ref 8.9–10.3)
CO2: 28 mmol/L (ref 22–32)
CREATININE: 0.78 mg/dL (ref 0.61–1.24)
Chloride: 106 mmol/L (ref 101–111)
Glucose, Bld: 67 mg/dL (ref 65–99)
Potassium: 3.6 mmol/L (ref 3.5–5.1)
Sodium: 140 mmol/L (ref 135–145)
Total Bilirubin: 0.7 mg/dL (ref 0.3–1.2)
Total Protein: 7.4 g/dL (ref 6.5–8.1)

## 2015-01-22 LAB — ACETAMINOPHEN LEVEL: Acetaminophen (Tylenol), Serum: <10 ug/mL — ABNORMAL LOW (ref 10–30)

## 2015-01-22 LAB — SALICYLATE LEVEL

## 2015-01-22 LAB — CBC
HCT: 39.4 % (ref 39.0–52.0)
Hemoglobin: 13.3 g/dL (ref 13.0–17.0)
MCH: 27.3 pg (ref 26.0–34.0)
MCHC: 33.8 g/dL (ref 30.0–36.0)
MCV: 80.9 fL (ref 78.0–100.0)
PLATELETS: 333 10*3/uL (ref 150–400)
RBC: 4.87 MIL/uL (ref 4.22–5.81)
RDW: 14 % (ref 11.5–15.5)
WBC: 6.1 10*3/uL (ref 4.0–10.5)

## 2015-01-22 LAB — RAPID URINE DRUG SCREEN, HOSP PERFORMED
Amphetamines: NOT DETECTED
Barbiturates: NOT DETECTED
Benzodiazepines: NOT DETECTED
Cocaine: NOT DETECTED
OPIATES: NOT DETECTED
TETRAHYDROCANNABINOL: POSITIVE — AB

## 2015-01-22 LAB — ETHANOL

## 2015-01-22 MED ORDER — ONDANSETRON HCL 4 MG PO TABS: 4.0000 mg | ORAL_TABLET | Freq: Three times a day (TID) | ORAL | Status: DC | PRN

## 2015-01-22 MED ORDER — ALUM & MAG HYDROXIDE-SIMETH 200-200-20 MG/5ML PO SUSP: 30.0000 mL | ORAL | Status: DC | PRN

## 2015-01-22 MED ORDER — ZOLPIDEM TARTRATE 5 MG PO TABS
5.0000 mg | ORAL_TABLET | Freq: Every evening | ORAL | Status: DC | PRN
  Administered 2015-01-22: 5 mg via ORAL
  Filled 2015-01-22: qty 1

## 2015-01-22 MED ORDER — IBUPROFEN 200 MG PO TABS: 600.0000 mg | ORAL_TABLET | Freq: Three times a day (TID) | ORAL | Status: DC | PRN

## 2015-01-22 MED ORDER — NICOTINE 21 MG/24HR TD PT24
21.0000 mg | MEDICATED_PATCH | Freq: Every day | TRANSDERMAL | Status: DC
  Administered 2015-01-22 – 2015-01-24 (×3): 21 mg via TRANSDERMAL
  Filled 2015-01-22 (×2): qty 1

## 2015-01-22 MED ORDER — LORAZEPAM 1 MG PO TABS: 1.0000 mg | ORAL_TABLET | Freq: Three times a day (TID) | ORAL | Status: DC | PRN

## 2015-01-22 MED ORDER — ACETAMINOPHEN 325 MG PO TABS: 650.0000 mg | ORAL_TABLET | ORAL | Status: DC | PRN

## 2015-01-22 MED ORDER — NICOTINE 21 MG/24HR TD PT24: 21.0000 mg | MEDICATED_PATCH | Freq: Every day | TRANSDERMAL | Status: DC

## 2015-01-22 MED ORDER — LORAZEPAM 1 MG PO TABS
1.0000 mg | ORAL_TABLET | Freq: Three times a day (TID) | ORAL | Status: DC | PRN
  Administered 2015-01-22: 1 mg via ORAL
  Filled 2015-01-22: qty 1
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
## 2015-01-22 NOTE — ED Notes (Signed)
Pt with TTS again 

## 2015-01-22 NOTE — ED Notes (Signed)
Pt stable, ambulatory, states understanding of discharge instructions 

## 2015-01-22 NOTE — ED Notes (Signed)
Pt ate 100% of his dinner tray this evening
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
## 2015-01-22 NOTE — [facility] Counselor (Signed)
[facility] Assessment Progress Note  Per Renata Caprice, DNP and Julieanne Cotton, NP, pt will be kept overnight and reassessed in the morning. Counselor advised pt's nurse, Trish Fountain.   Ronald Martinez. Ladona Ridgel, MS, NCC, LPCA Counselor

## 2015-01-22 NOTE — ED Notes (Signed)
Pt has pocketknife that [employer] has checked in with security.
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
## 2015-01-22 NOTE — ED Notes (Signed)
Bed: [facility] Expected date:  Expected time:  Means of arrival:  Comments: Tr4 

## 2015-01-22 NOTE — ED Notes (Signed)
Called [facility] spoke with Lajoyce Corners Dey-Johnson about pt able to be discharged and was told he doesn't meet inpatient criteria and to discharge with [facility] followup.
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
## 2015-01-22 NOTE — Discharge Instructions (Signed)
Paranoia °Paranoia is a distrust of others that is not based on a real reason for distrust. This may reach delusional levels. This means the paranoid person feels the world is against them when there is no reason to make them feel that way. People with paranoia feel as though people around them are "out to get them".  °SIMILAR MENTAL ILNESSES °· Depression is a feeling as though you are down all the time. It is normal in some situations where you have just lost a loved one. It is abnormal if you are having feelings of paranoia with it. °· Dementia is a physical problem with the brain in which the brain no longer works properly. There are problems with daily activities of living. Alzheimer's disease is one example of this. Dementia is also caused by old age changes in the brain which come with the death of brain cells and small strokes. °· Paranoidschizophrenia. People with paranoid schizophrenia and persecutory delusional disorder have delusions in which they feel people around them are plotting against them. Persecutory delusions in paranoid schizophrenia are bizarre, sometimes grandiose, and often accompanied by auditory hallucinations. This means the person is hearing voices that are not there. °· Delusionaldisorder (persecutory type). Delusions experienced by individuals with delusional disorder are more believable than those experienced by paranoid schizophrenics; they are not bizarre, though still unjustified. Individuals with delusional disorder may seem offbeat or quirky rather than mentally ill, and therefore, may never seek treatment. °All of these problems usually do not allow these people to interact socially in an acceptable manner. °CAUSES °The cause of paranoia is often not known. It is common in people with extended abuse of: °· Cocaine. °· Amphetamine. °· Marijuana. °· Alcohol. °Sometimes there is an inherited tendency. It may be associated with stress or changes in brain chemistry. °DIAGNOSIS    °When paranoia is present, your caregiver may: °· Refer you to a specialist. °· Do a physical exam. °· Perform other tests on you to make sure there are not other problems causing the paranoia including: °¨ Physical problems. °¨ Mental problems. °¨ Chemical problems (other than drugs). °Testing may be done to determine if there is a psychiatric disability present that can be treated with medicine. °TREATMENT  °· Paranoia that is a symptom of a psychiatric problem should be treated by professionals. °· Medicines are available which can help this disorder. Antipsychotic medicine may be prescribed by your caregiver. °· Sometimes psychotherapy may be useful. °· Conditions such as depression or drug abuse are treated individually. If the paranoia is caused by drug abuse, a treatment facility may be helpful. Depression may be helped by antidepressants. °PROGNOSIS  °· Paranoid people are difficult to treat because of their belief that everyone is out to get them or harm them. Because of this mistrust, they often must be talked into entering treatment by a trusted family member or friend. They may not want to take medicine as they may see this as an attempt to poison them. °· Gradual gains in the trust of a therapist or caregiver helps in a successful treatment plan. °· Some people with PPD or persecutory delusional disorder function in society without treatment in limited fashion. °Document Released: 05/23/2003 Document Revised: 08/12/2011 Document Reviewed: 01/26/2008 °ExitCare® Patient Information ©2015 [employer]. This information is not intended to replace advice given to you by your health care provider. Make sure you discuss any questions you have with your health care provider. ° ° ° °Emergency Department Resource Guide °1) Find a   Doctor and Pay Out of Pocket °Although you won't have to find out who is covered by your insurance plan, it is a good idea to ask around and get recommendations. You will then need to  call the office and see if the doctor you have chosen will accept you as a new patient and what types of options they offer for patients who are self-pay. Some doctors offer discounts or will set up payment plans for their patients who do not have insurance, but you will need to ask so you aren't surprised when you get to your appointment. ° °2) Contact Your Local Health Department °Not all health departments have doctors that can see patients for sick visits, but many do, so it is worth a call to see if yours does. If you don't know where your local health department is, you can check in your phone book. The CDC also has a tool to help you locate your state's health department, and many state websites also have listings of all of their local health departments. ° °3) Find a Walk-in Clinic °If your illness is not likely to be very severe or complicated, you may want to try a walk in clinic. These are popping up all over the country in pharmacies, drugstores, and shopping centers. They're usually staffed by nurse practitioners or physician assistants that have been trained to treat common illnesses and complaints. They're usually fairly quick and inexpensive. However, if you have serious medical issues or chronic medical problems, these are probably not your best option. ° °No Primary Care Doctor: °- Call [facility] at  832-8000 - they can help you locate a primary care doctor that  accepts your insurance, provides certain services, etc. °- Physician Referral Service- 1-800-533-3463 ° °Chronic Pain Problems: °Organization         Address  Phone   Notes  °[facility]  (336) 297-2271 Patients need to be referred by their primary care doctor.  ° °Medication Assistance: °Organization         Address  Phone   Notes  °[place] [facility] [address] °[place], [place] [address] (336) 641-8030 --Must be a resident of [place] °-- Must have NO insurance  coverage whatsoever (no Medicaid/ Medicare, etc.) °-- The pt. MUST have a primary care doctor that directs their care regularly and follows them in the community °  °[facility]  (866) 331-1348   °[employer]  (888) 892-1162   ° °Agencies that provide inexpensive medical care: °Organization         Address  Phone   Notes  °[facility]  (336) 832-8035   °[facility]    (336) 832-7272   °[facility] [address] °[place], [place] [address] (336) 832-4777   °[facility] [address], °[place] (336) 271-4999   °[facility]    (336) 373-0678   °[facility]    (336) 272-1050   °[facility] ° [address], [place] Phone:  (336) 832-4444, Fax:  (336) 832-4440 Hours of Operation:  9 am - 6 pm, M-F.  Also accepts Medicaid/Medicare and self-pay.  °[facility] ° [address], [place] Phone: (336) 832-3150, Fax: (336) [phone]. Hours of Operation:  8:30 am - 5:30 pm, M-F.  Also accepts Medicaid and self-pay.  °[facility] [place] [address], [place] Phone: (336)   878-6027   °[facility] [address], [place], [place] (336)723-1848, Ext. 123 Mondays & Thursdays: 7-9 AM.  First 15 patients are seen on a first come, first serve basis. °  ° °Medicaid-accepting [place] Providers: ° °Organization         Address  Phone   Notes  °[facility] [address], [place] (336) 641-2100 Also accepts self-pay patients.  °[facility] [address], [place] ° (336) 856-9996   °[facility] [address], [place] (336) 288-8857   °[facility] [address] [place] [address], [place] (336) 299-7000   °Veita Bland [address], [place]  ° (336) 373-1557 Only accepts [place] Access Medicaid patients after they have their  name applied to their card.  ° °Self-Pay (no insurance) in [place]: ° °Organization         Address  Phone   Notes  °Sickle Cell Patients, [facility] [address], [place] (336) 832-1970   °[facility] [address], [place] (336) 832-4400   °[facility] ° [address], [place] (336) 992-4800   °[facility]/Dr. Osei-Bonsu ° [address] [place] [address], [place] or [address], [place] (336) 841-8500 Phone number for both [place] and [place] locations is the same.  °[facility] [address], [place] (336) 299-0000   °[facility] [address] [place] [address], [place] or [address] (336) 852-7530 °(336) 878-2260   °[facility] [address], [place] (336) 350-1642, phone; (336) 294-5005, fax Sees patients 1st and 3rd Saturday of every month.  Must not qualify for public or private insurance (i.e. Medicaid, Medicare, [place] Health Choice, Veterans' Benefits) • Household income should be no more than 200% of the poverty level •The clinic cannot treat you if you are pregnant or think you are pregnant • Sexually transmitted diseases are not treated at the clinic.  ° ° °Dental Care: °Organization         Address  Phone  Notes  °[facility] [address], [place] (336) 641-6152 Accepts children up to age 21 who are enrolled in Medicaid or [place] Health Choice; pregnant women with a Medicaid card; and children who have applied for Medicaid or [place] Health Choice, but were declined, whose parents can pay a reduced fee at time of service.  °[facility] [place]  [address], [place] (336) 641-7733 Accepts children up to age 21 who are enrolled in Medicaid or [place] Health Choice; pregnant women with a Medicaid card; and children who have applied for  Medicaid or [place] Health Choice, but were declined, whose parents can pay a reduced fee at time of service.  °[facility] ° [address], [place] (336) 641-4533 Patients are seen by appointment only. Walk-ins are not accepted. [facility] will see patients 18 years of age and older. °Monday - Tuesday (8am-5pm) °Most Wednesdays (8:30-5pm) °$30 per visit, cash only  °[facility] ° [address], [place] (336) 641-4533 Patients are seen by appointment only. Walk-ins are not accepted. [facility] will see patients 18 years of age and older. °One Wednesday Evening (Monthly: Volunteer Based).  $  30 per visit, cash only  °[facility]  (919) 537-3737 for adults; Children under age 4, call Graduate Pediatric Dentistry at ([phone]) [phone]. Children aged 4-14, please call ([phone]) [phone] to request a pediatric application. ° Dental services are provided in all areas of dental care including fillings, crowns and bridges, complete and partial dentures, implants, gum treatment, root canals, and extractions. Preventive care is also provided. Treatment is provided to both adults and children. °Patients are selected via a lottery and there is often a waiting list. °  °[facility] [address], °[place] ° (336) 763-8833 [email or web address] °  °[facility] [address], [place], [place] (336)723-1848, Ext. 123 Second and Fourth Thursday of each month, opens at 6:30 AM; Clinic ends at 9 AM.  Patients are seen on a first-come first-served basis, and a limited number are seen during each clinic.  ° °[facility] ° [address], [place], [place] (336) 723-7904   Eligibility Requirements °You must have lived in [place], [place], or [place] counties for at least the last three months. °  You cannot be eligible for state or federal sponsored healthcare insurance, including Veterans Administration, Medicaid,  or Medicare. °  You generally cannot be eligible for healthcare insurance through your employer.  °  How to apply: °Eligibility screenings are held every Tuesday and Wednesday afternoon from 1:00 pm until 4:00 pm. You do not need an appointment for the interview!  °[facility] [address], [place], [place] 336-631-2330   °[facility]  336-342-8273   °[facility]  336-703-3100   °[facility]  336-570-6415   ° °Behavioral Health Resources in the Community: °Intensive Outpatient Programs °Organization         Address  Phone  Notes  °[facility] [address], [place], [place] 336-878-6098   °[facility] [address], [place], [place] 336-832-9800   °[facility] [address], [place], [place] ° 336-882-2125   °[facility] [address],  °[place], [place] 1-800-853-5163 or 336-641-4981   °Substance Abuse Resources °Organization         Address  Phone  Notes  °[facility]  336-882-2125   °[facility]  336-784-9470   °[facility]  336-285-9073   °[facility]  336-845-3988   °Residential & Outpatient Substance Abuse Program  1-800-659-3381   °Psychological Services °Organization         Address  Phone  Notes  °[facility]  336- 832-9600   °[facility]  336- 378-7881   °[facility] [address], [place] 1-800-853-5163 or 336-641-4981   ° °Mobile Crisis Teams °Organization         Address  Phone  Notes  °[facility], Mobile Crisis Care Unit  1-877-626-1772   °Assertive °[facility] ° [address] [place], [place] 336-834-9664   °Sharon DeEsch [address] °[place] 336-554-5454   ° °Self-Help/Support Groups °Organization         Address  Phone             Notes  °[facility] [place] - variety of support groups   336- 373-1402 Call for more information  °Narcotics Anonymous (NA), [facility] [address], °[place]  2 meetings at this location  ° °Residential Treatment Programs °Organization           Address  Phone  Notes  °[facility] [address],    °[place]  1-866-801-8205   °[facility] ° [address], [place], [place] 704-293-8524   °[facility] [address], [place] 336-845-3988 Admissions: 8am-3pm M-F  °[facility] [address]    °[place], [place] 336-841-1104   °[facility] [address], [place], [place] 336-379-7146   °[facility] [address]  °[place], [place] 336-285-9073   °[facility] - Intensive Outpatient [address], [place], [place] 336-852-3033   °[facility] [address]  °[place], [place] 1-877-615-2722 or 336-784-9470   °[facility] [address] [place], [place] 336-227-7417 Accepts Medicaid  °[facility] [address]  °[place] 1-800-659-3381 Substance Abuse/Addiction Treatment  ° °[place] Behavioral Health Resources °Organization         Address  Phone  Notes  °[facility]  (888) 581-9988   °Julie Brannon, PhD [address] [place], [place]   (336) 349-5553 or (336) 951-0000   °[facility]   [address] °[place], [place] (336) 349-4454   °[facility] [address], [place], [place] (336) 342-8316 Insurance/Medicaid/sponsorship through [facility]  °[facility] [address] [place] [address]                                    [place], [place] (336) 342-8316 Therapy/tele-psych/case  °[facility] [address]  ° [place],  (336) 349-2233    °Dr. Arfeen  (336) 349-4544   °[facility] [place]  [employer] [place] [facility] 1) [address], [place] °2) [address], [place] °3)  [address], [place] (336) 349-3220 °(336) 342-7768 ° °(336) 342-8140   °[place] [facility] (336) 342-1394 or (336) 342-3537 (After Hours)    ° ° ° °
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
## 2015-01-23 DIAGNOSIS — F250 Schizoaffective disorder, bipolar type: Secondary | ICD-10-CM | POA: Diagnosis not present

## 2015-01-23 MED ORDER — HYDROXYZINE HCL 25 MG PO TABS
25.0000 mg | ORAL_TABLET | Freq: Four times a day (QID) | ORAL | Status: DC | PRN
  Administered 2015-01-23: 25 mg via ORAL
  Filled 2015-01-23: qty 1

## 2015-01-23 MED ORDER — PALIPERIDONE ER 6 MG PO TB24
6.0000 mg | ORAL_TABLET | Freq: Every day | ORAL | Status: DC
  Administered 2015-01-23 – 2015-01-24 (×2): 6 mg via ORAL
  Filled 2015-01-23 (×2): qty 1

## 2015-01-23 MED ORDER — BENZTROPINE MESYLATE 1 MG PO TABS
1.0000 mg | ORAL_TABLET | Freq: Every day | ORAL | Status: DC
  Administered 2015-01-23 – 2015-01-24 (×2): 1 mg via ORAL
  Filled 2015-01-23 (×2): qty 1

## 2015-01-23 MED ORDER — TRAZODONE HCL 50 MG PO TABS
50.0000 mg | ORAL_TABLET | Freq: Every day | ORAL | Status: DC
  Administered 2015-01-23: 50 mg via ORAL
  Filled 2015-01-23: qty 1

## 2015-01-23 NOTE — [facility] Counselor (Signed)
Pending review for possible placement with [facility].  

## 2015-01-23 NOTE — ED Notes (Signed)
Pt resting with eyes close. Even and unlabored breathing noted. Pt easily arrousable.

## 2015-01-23 NOTE — ED Notes (Signed)
MD at bedside. 

## 2015-01-23 NOTE — BH Assessment (Signed)
[facility] Assessment Progress Note  The following facilities have been contacted to seek placement for this pt, with results as noted:  Beds available, information sent, decision pending:  [facility]  At capacity:  [facility]   Lavonda Thal, MA Triage Specialist [phone]     

## 2015-01-23 NOTE — ED Notes (Signed)
Bed: [facility] Expected date:  Expected time:  Means of arrival:  Comments: Hold RM 32

## 2015-01-23 NOTE — ED Notes (Signed)
Pt sleeping with rise and fall to chest noted.

## 2015-01-23 NOTE — ED Notes (Signed)
Pt AAO x 3, no distress noted, calm & cooperative, resting at present, watching TV.  Monitoring for safety, Q 15 min checks in effect.

## 2015-01-23 NOTE — ED Notes (Signed)
Pt resting comfortably with eyes close. Respiration even and unlabored  
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
## 2015-01-24 ENCOUNTER — Inpatient Hospital Stay (HOSPITAL_COMMUNITY)
Admission: AD | Admit: 2015-01-24 | Discharge: 2015-01-27 | DRG: 885 | Disposition: A | Discharge status: Home / Self Care | Payer: Medicaid Other | Attending: Psychiatry | Admitting: Psychiatry

## 2015-01-24 ENCOUNTER — Encounter (HOSPITAL_COMMUNITY): Payer: Self-pay

## 2015-01-24 DIAGNOSIS — F17210 Nicotine dependence, cigarettes, uncomplicated: Secondary | ICD-10-CM | POA: Diagnosis present

## 2015-01-24 DIAGNOSIS — F1390 Sedative, hypnotic, or anxiolytic use, unspecified, uncomplicated: Secondary | ICD-10-CM | POA: Diagnosis present

## 2015-01-24 DIAGNOSIS — F1320 Sedative, hypnotic or anxiolytic dependence, uncomplicated: Secondary | ICD-10-CM | POA: Diagnosis present

## 2015-01-24 DIAGNOSIS — F1220 Cannabis dependence, uncomplicated: Secondary | ICD-10-CM | POA: Diagnosis present

## 2015-01-24 DIAGNOSIS — F250 Schizoaffective disorder, bipolar type: Secondary | ICD-10-CM | POA: Diagnosis present

## 2015-01-24 DIAGNOSIS — R45850 Homicidal ideations: Secondary | ICD-10-CM | POA: Diagnosis not present

## 2015-01-24 MED ORDER — TRAZODONE HCL 50 MG PO TABS
50.0000 mg | ORAL_TABLET | Freq: Every day | ORAL | Status: DC
  Filled 2015-01-24: qty 1

## 2015-01-24 MED ORDER — HYDROXYZINE HCL 25 MG PO TABS
25.0000 mg | ORAL_TABLET | Freq: Four times a day (QID) | ORAL | Status: DC | PRN
  Administered 2015-01-24: 25 mg via ORAL
  Filled 2015-01-24: qty 1

## 2015-01-24 MED ORDER — PALIPERIDONE ER 6 MG PO TB24
6.0000 mg | ORAL_TABLET | Freq: Every day | ORAL | Status: DC
  Administered 2015-01-25 – 2015-01-27 (×3): 6 mg via ORAL
  Filled 2015-01-24: qty 1
  Filled 2015-01-24: qty 3
  Filled 2015-01-24 (×4): qty 1

## 2015-01-24 MED ORDER — HYDROXYZINE HCL 25 MG PO TABS
25.0000 mg | ORAL_TABLET | Freq: Once | ORAL | Status: AC
  Administered 2015-01-24: 25 mg via ORAL
  Filled 2015-01-24 (×2): qty 1

## 2015-01-24 MED ORDER — TRAZODONE HCL 50 MG PO TABS
50.0000 mg | ORAL_TABLET | Freq: Every evening | ORAL | Status: DC | PRN
  Administered 2015-01-24 – 2015-01-26 (×2): 50 mg via ORAL
  Filled 2015-01-24 (×3): qty 1
  Filled 2015-01-24: qty 6
  Filled 2015-01-24 (×6): qty 1
  Filled 2015-01-24: qty 6

## 2015-01-24 MED ORDER — BENZTROPINE MESYLATE 1 MG PO TABS
1.0000 mg | ORAL_TABLET | Freq: Every day | ORAL | Status: DC
  Administered 2015-01-25 – 2015-01-27 (×3): 1 mg via ORAL
  Filled 2015-01-24 (×2): qty 1
  Filled 2015-01-24: qty 3
  Filled 2015-01-24 (×3): qty 1

## 2015-01-24 NOTE — Tx Team (Addendum)
Initial Interdisciplinary Treatment Plan   PATIENT STRESSORS: Financial difficulties Medication change or noncompliance   PATIENT STRENGTHS: Capable of independent living Physical Health Supportive family/friends   PROBLEM LIST: Problem List/Patient Goals Date to be addressed Date deferred Reason deferred Estimated date of resolution  "Hearing voices talking down  01/24/15   D/C     To me"      "Depression" 01/24/15   D/C  "Anxious" 01/24/15   D/C  Risk for suicide                               DISCHARGE CRITERIA:  Need for constant or close observation no longer present Reduction of life-threatening or endangering symptoms to within safe limits  PRELIMINARY DISCHARGE PLAN: Outpatient therapy Return to previous living arrangement  PATIENT/FAMIILY INVOLVEMENT: This treatment plan has been presented to and reviewed with the patient, Ronald Martinez.  The patient and family have been given the opportunity to ask questions and make suggestions.  Norm Parcel Reddick 01/24/2015, 6:59 PM
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
## 2015-01-24 NOTE — BH Assessment (Addendum)
[facility] Assessment Progress Note  Per Thedore Mins, MD, this pt requires psychiatric hospitalization at this time. Pt presents under IVC initiated by Toney Rakes at [facility], and Dr. Jannifer Franklin has upheld the petition. Berneice Heinrich, RN, [facility] has assigned pt to [facility]m 500[facility]1. Pt has signed Consent to Release Information to [facility] and a notification call has been placed. Several attempts have been made to fax signed form along with IVC documents to [facility] without success.  I will continue to try. Pt's nurse, Kendal Hymen, has been notified, and agrees to send original paperwork along with pt via [employer], and to call report to [phone].   Doylene Canning, MA  Triage Specialist  [phone]   Addendum:  As of 14:45 documents were successfully faxed to [facility].  Doylene Canning, MA Triage Specialist [phone]
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
## 2015-01-24 NOTE — Progress Notes (Signed)
D: Pt denies SI/HI/AVH. Pt is pleasant and cooperative. Pt is responding to internal stimuli. Pt was observed talking to no one there and pacing the unit getting agitated . Pt obsessed about the situation and the fact that he had to   A: Pt was offered support and encouragement. Pt was given scheduled medications. Pt was encourage to attend groups. Q 15 minute checks were done for safety. Verbal de-escalation and reassurance seemed to calm pt down.   R:. Pt is taking medication. Pt has no complaints at this time .Pt receptive to treatment and safety maintained on unit.

## 2015-01-24 NOTE — ED Notes (Signed)
Pt is alert and oriented.  Has pressured speech and a stutter.  He appears to be a little anxious as he waits for transport to [facility].  He contracts for safety.  Denies pain or discomfort.

## 2015-01-24 NOTE — Plan of Care (Signed)
Problem: Ineffective individual coping Goal: STG: Patient will remain free from self harm Outcome: Progressing Pt safe on the unit  Problem: Alteration in thought process Goal: LTG-Patient behavior demonstrates decreased signs psychosis (Patient behavior demonstrates decreased signs of psychosis to the point the patient is safe to return home and continue treatment in an outpatient setting.)  Outcome: Not Progressing Pt observed on unit talking to no one seen by Clinical research associate

## 2015-01-25 ENCOUNTER — Encounter (HOSPITAL_COMMUNITY): Payer: Self-pay | Admitting: Psychiatry

## 2015-01-25 DIAGNOSIS — F1320 Sedative, hypnotic or anxiolytic dependence, uncomplicated: Secondary | ICD-10-CM | POA: Diagnosis present

## 2015-01-25 DIAGNOSIS — R45850 Homicidal ideations: Secondary | ICD-10-CM

## 2015-01-25 DIAGNOSIS — F250 Schizoaffective disorder, bipolar type: Principal | ICD-10-CM

## 2015-01-25 MED ORDER — LAMOTRIGINE 25 MG PO TABS
25.0000 mg | ORAL_TABLET | Freq: Every day | ORAL | Status: DC
  Administered 2015-01-25 – 2015-01-27 (×3): 25 mg via ORAL
  Filled 2015-01-25 (×4): qty 1
  Filled 2015-01-25: qty 3

## 2015-01-25 MED ORDER — NICOTINE 21 MG/24HR TD PT24
21.0000 mg | MEDICATED_PATCH | Freq: Every day | TRANSDERMAL | Status: DC
  Administered 2015-01-25 – 2015-01-27 (×3): 21 mg via TRANSDERMAL
  Filled 2015-01-25: qty 3
  Filled 2015-01-25 (×4): qty 1

## 2015-01-25 NOTE — [facility] Group Notes (Signed)
[facility] Group Therapy  01/25/2015 , 1:36 PM    Type of Therapy:  [facility] Presentation  Participation Level:  Active  Participation Quality:  Attentive  Affect:  Blunted  Cognitive:  Oriented  Insight:  Limited  Engagement in Therapy:  Engaged  Modes of Intervention:  Discussion, Education and Socialization  Summary of Progress/Problems:  Onalee Hua from [facility] came to present his recovery story and play the guitar.  Invited.  Chose to not attend.  [name] B 01/25/2015 , 1:36 PM
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
## 2015-01-25 NOTE — Progress Notes (Signed)
  D: Patient has been in bed sleeping since shift change. Unable to assess psychological status at this time.Respirations even and non-labored.  A: Staff will continue to monitor on q 15 minute checks, follow treatment plan, and give meds as ordered. R: Will assess if awakens during the night.
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
## 2015-01-25 NOTE — [facility] Suicide Risk Assessment (Signed)
[facility] Admission Suicide Risk Assessment   Nursing information obtained from:    Demographic factors:    Current Mental Status:    Loss Factors:    Historical Factors:    Risk Reduction Factors:    Total Time spent with patient: 30 minutes Principal Problem: Schizoaffective disorder, bipolar type Diagnosis:   Patient Active Problem List   Diagnosis Date Noted  . 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 Cannabis use disorder, severe, dependence [F12.20] 08/17/2014  . Schizoaffective disorder, bipolar type [F25.0] 08/16/2014     Continued Clinical Symptoms:  Alcohol Use Disorder Identification Test Final Score (AUDIT): 0 The "Alcohol Use Disorders Identification Test", Guidelines for Use in Primary Care, Second Edition.  [employer] writer [facility]). Score between 0-7:  no or low risk or alcohol related problems. Score between 8-15:  moderate risk of alcohol related problems. Score between 16-19:  high risk of alcohol related problems. Score 20 or above:  warrants further diagnostic evaluation for alcohol dependence and treatment.   CLINICAL FACTORS:   Alcohol/Substance Abuse/Dependencies Previous Psychiatric Diagnoses and Treatments   Musculoskeletal: Strength & Muscle Tone: within normal limits Gait & Station: normal Patient leans: N/A  Psychiatric Specialty Exam: Physical Exam  Review of Systems  Psychiatric/Behavioral: Positive for depression, hallucinations and substance abuse. The patient is nervous/anxious.   All other systems reviewed and are negative.   Blood pressure 90/55, pulse 110, temperature 97.6 F (36.4 C), temperature source Oral, resp. rate 16, height 5' 10.5" (1.791 m), weight 85.73 kg (189 lb), SpO2 100 %.Body mass index is 26.73 kg/(m^2).  General Appearance: Casual  Eye Contact::  Fair  Speech:  Clear and Coherent  Volume:  Normal  Mood:  Anxious and Irritable  Affect:  Appropriate  Thought Process:  Coherent  Orientation:   Full (Time, Place, and Person)  Thought Content:  Delusions, Paranoid Ideation and Rumination  Suicidal Thoughts:  No  Homicidal Thoughts:  Yes.  without intent/plan  Memory:  Immediate;   Fair Recent;   Fair Remote;   Fair  Judgement:  Impaired  Insight:  Shallow  Psychomotor Activity:  Restlessness  Concentration:  Fair  Recall:  [employer] of Knowledge:Fair  Language: Fair  Akathisia:  No  Handed:  Right  AIMS (if indicated):     Assets:  Communication Skills Desire for Improvement  Sleep:  Number of Hours: 6.75  Cognition: WNL  ADL's:  Intact     COGNITIVE FEATURES THAT CONTRIBUTE TO RISK:  Closed-mindedness, Polarized thinking and Thought constriction (tunnel vision)    SUICIDE RISK:   Moderate:  Frequent suicidal ideation with limited intensity, and duration, some specificity in terms of plans, no associated intent, good self-control, limited dysphoria/symptomatology, some risk factors present, and identifiable protective factors, including available and accessible social support.  PLAN OF CARE: Patient will benefit from inpatient treatment and stabilization.  Estimated length of stay is 5-7 days.  Reviewed past medical records,treatment plan.  Will continue Invega 6 mg po daily for psychosis and mood lability. Will add Cogentin 0.5 mg po daily for EPS. Will add Lamictal 25 mg po daily for mood lability. Continue Vistaril 25 mg po tid prn for anxiety sx.  Will continue to monitor vitals ,medication compliance and treatment side effects while patient is here.  Will monitor for medical issues as well as call consult as needed.  Reviewed labs CBC, CMP, UDS - pos for THC , BAL <5 in EHR from 01/22/15. Also reviewed TSH (07/19/14) - WNL, Hba1c-  5.8 (07/19/14) ,lipid panel ( 07/19/14). Will order EKG for qtc , as well as PL level.  CSW will start working on disposition.  Patient to participate in therapeutic milieu .       Medical Decision Making:  Review of Psycho-Social  Stressors (1), Review or order clinical lab tests (1), Established Problem, Worsening (2), Review of Medication Regimen & Side Effects (2) and Review of New Medication or Change in Dosage (2)  I certify that inpatient services furnished can reasonably be expected to improve the patient's condition.   Stephani Janak MD 01/25/2015, 1:02 PM

## 2015-01-25 NOTE — Plan of Care (Signed)
Problem: Diagnosis: Increased Risk For Suicide Attempt Goal: LTG-Patient Will Report Improvement in Psychotic Symptoms LTG (by discharge) : Patient will report improvement in psychotic symptoms.  Outcome: Progressing Hari still reports that he heard voices last night but none this morning.  We will continue to monitor the progress towards his goals.
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
## 2015-01-26 MED ORDER — MAGNESIUM HYDROXIDE 400 MG/5ML PO SUSP
30.0000 mL | Freq: Every day | ORAL | Status: DC | PRN
  Administered 2015-01-26: 30 mL via ORAL
  Filled 2015-01-26: qty 30

## 2015-01-26 NOTE — [facility] Group Notes (Signed)
[facility] Group Notes:  (Nursing/MHT/Case Management/Adjunct)  Date:  01/26/2015  Time:  4:13 PM  Type of Therapy:  Nurse Education  Participation Level:  Did Not Attend  Participation Quality:  did not attend group  Affect:    Cognitive:    Insight:    Engagement in Group:    Modes of Intervention:    Summary of Progress/Problems:  Group topic was positive leisure activities.  Discussed coping skills and setting appropriate goals.   He did not attend group.   Norm Parcel Khristian Seals 01/26/2015, 4:13 PM

## 2015-01-26 NOTE — Progress Notes (Signed)
Adult Psychoeducational Group Note  Date:  01/26/2015 Time:  11:25 PM  Group Topic/Focus:  Wrap-Up Group:   The focus of this group is to help patients review their daily goal of treatment and discuss progress on daily workbooks.  Participation Level:  Active  Participation Quality:  Appropriate and Attentive  Affect:  Appropriate  Cognitive:  Appropriate  Insight: Appropriate and Good  Engagement in Group:  Engaged  Modes of Intervention:  Education  Additional Comments:  Patient stated his day went well especially because he took his medication. Patient goal for tomorrow is to get discharged.   Merlinda Frederick 01/26/2015, 11:25 PM

## 2015-01-26 NOTE — Plan of Care (Signed)
Problem: Ineffective individual coping Goal: LTG: Patient will report a decrease in negative feelings Outcome: Progressing Alioune reports that he is feeling much better today.  He denies any negative feelings or thoughts.

## 2015-01-26 NOTE — Progress Notes (Signed)
Pt reports he slept well last night.  He denies SI/HI, denies hallucinations, denies pain.  Pt reports he will notify staff if he has thoughts of self-harm or thoughts of harming others.  He denies needs and concerns at this time.  Will continue to monitor and assess for safety.

## 2015-01-26 NOTE — Plan of Care (Signed)
Problem: Diagnosis: Increased Risk For Suicide Attempt Goal: STG-Patient Will Attend All Groups On The Unit Outcome: Progressing Pt attended evening group on 01/26/15     

## 2015-01-26 NOTE — Progress Notes (Signed)
D: Pt has appropriate affect and pleasant mood.  When asked how his day has been, pt reports "so far, so good."  Pt reports his goal today was "to go to groups and stuff."  He describes his mood as "fair."  He is more engaged in his treatment tonight compared to last night.  He has been more visible in the milieu and he attended evening group.  He smiled occasionally and interacted appropriately with peers and staff.  Pt denies SI/HI, denies hallucinations, denies pain.   A: Introduced self to pt.  Encouraged, supported, and actively listened to pt.  Medication administered per order.   R: Pt is compliant with scheduled medication.  Pt verbally contracts for safety.  Will continue to monitor and assess.

## 2015-01-26 NOTE — Progress Notes (Signed)
Ronald Martinez Progress Note  01/26/2015 12:27 PM Ronald Martinez  MRN:  [id number] Subjective:  Patient states " I still have those thoughts , but I am better today."  Objective: Patient seen and chart reviewed.Discussed patient with treatment team. Ronald Martinez is an 34 y.o. male presenting to ED due to paranoia and AH. Pt today continues to be irritable, stays withdrawn. Pt continues to struggle with paranoia, delusional thoughts about 'people out to get him' and has HI towards these people . Pt reports sleep as improved. Per nursing pt stays withdrawn and isolative and needs a lot of encouragement. He has been tolerating his medications well without side effects.    Principal Problem: Schizoaffective disorder, bipolar type Diagnosis:   Patient Active Problem List   Diagnosis Date Noted  . 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 Cannabis use disorder, severe, dependence [F12.20] 08/17/2014  . Schizoaffective disorder, bipolar type [F25.0] 08/16/2014   Total Time spent with patient: 30 minutes   Past Medical History:  Past Medical History  Diagnosis Date  . Depression   . Schizophrenia   . Bipolar 1 disorder   . Asthma    History reviewed. No pertinent past surgical history. Family History:  Family History  Problem Relation Age of Onset  . Asthma Mother   . Alcoholism Other    Social History:  History  Alcohol Use  . Yes     History  Drug Use  . Yes  . Special: Marijuana, Benzodiazepines    Social History   Social History  . Marital Status: Single    Spouse Name: N/A  . Number of Children: N/A  . Years of Education: N/A   Social History Main Topics  . Smoking status: Current Every Day Smoker -- 1.00 packs/day    Types: Cigarettes  . Smokeless tobacco: None  . Alcohol Use: Yes  . Drug Use: Yes    Special: Marijuana, Benzodiazepines  . Sexual Activity: No   Other Topics Concern  . None   Social History Narrative   Additional History:     Sleep: Fair  Appetite:  Fair    Musculoskeletal: Strength & Muscle Tone: within normal limits Gait & Station: normal Patient leans: N/A   Psychiatric Specialty Exam: Physical Exam  Review of Systems  Psychiatric/Behavioral: Positive for depression and hallucinations. The patient is nervous/anxious.     Blood pressure 112/72, pulse 120, temperature 97.5 F (36.4 C), temperature source Oral, resp. rate 18, height 5' 10.5" (1.791 m), weight 89.359 kg (197 lb), SpO2 100 %.Body mass index is 27.86 kg/(m^2).  General Appearance: Disheveled  Eye Contact::  Minimal  Speech:  Slow has stuttering  Volume:  Normal  Mood:  Anxious and Irritable  Affect:  Labile  Thought Process:  Irrelevant  Orientation:  Full (Time, Place, and Person)  Thought Content:  Delusions, Paranoid Ideation and Rumination  Suicidal Thoughts:  No  Homicidal Thoughts:  Yes.  without intent/plan  Memory:  Immediate;   Fair Recent;   Fair Remote;   Fair  Judgement:  Impaired  Insight:  Shallow  Psychomotor Activity:  Normal  Concentration:  Poor  Recall:  [employer] of Knowledge:Fair  Language: Fair  Akathisia:  No  Handed:  Right  AIMS (if indicated):     Assets:  Desire for Improvement  ADL's:  Intact  Cognition: WNL  Sleep:  Number of Hours: 6.5     Current Medications: Current Facility-Administered Medications  Medication Dose Route Frequency Provider Last Rate  Last Dose  . benztropine (COGENTIN) tablet 1 mg  1 mg Oral Daily Earney Navy, NP   1 mg at 01/26/15 0827  . hydrOXYzine (ATARAX/VISTARIL) tablet 25 mg  25 mg Oral Q6H PRN Earney Navy, NP   25 mg at 01/24/15 2056  . lamoTRIgine (LAMICTAL) tablet 25 mg  25 mg Oral Daily Jomarie Longs, Martinez   25 mg at 01/26/15 0827  . magnesium hydroxide (MILK OF MAGNESIA) suspension 30 mL  30 mL Oral Daily PRN Sharaine Delange, Martinez      . nicotine (NICODERM CQ - dosed in mg/24 hours) patch 21 mg  21 mg Transdermal Daily Jomarie Longs, Martinez   21  mg at 01/26/15 0828  . paliperidone (INVEGA) 24 hr tablet 6 mg  6 mg Oral Daily Earney Navy, NP   6 mg at 01/26/15 0827  . traZODone (DESYREL) tablet 50 mg  50 mg Oral QHS,MR X 1 Evanna Burkett, NP   50 mg at 01/24/15 2057    Lab Results: No results found for this or any previous visit (from the past 48 hour(s)).  Physical Findings: AIMS: Facial and Oral Movements Muscles of Facial Expression: None, normal Lips and Perioral Area: None, normal Jaw: None, normal Tongue: None, normal,Extremity Movements Upper (arms, wrists, hands, fingers): None, normal Lower (legs, knees, ankles, toes): None, normal, Trunk Movements Neck, shoulders, hips: None, normal, Overall Severity Severity of abnormal movements (highest score from questions above): None, normal Incapacitation due to abnormal movements: None, normal Patient's awareness of abnormal movements (rate only patient's report): No Awareness, Dental Status Current problems with teeth and/or dentures?: No Does patient usually wear dentures?: No  CIWA:    COWS:    Assessment:  Ronald Martinez is an 34 y.o. male presenting to ED due to paranoia and AH.Pt continues to be irritable , anxious , delusional with HI. Will continue treatment.   Treatment Plan Summary: Daily contact with patient to assess and evaluate symptoms and progress in treatment and Medication management Will continue Invega 6 mg po daily for psychosis and mood lability. Will continue Cogentin 0.5 mg po daily for EPS. Will continue Lamictal 25 mg po daily for mood lability. Continue Vistaril 25 mg po tid prn for anxiety sx.  Will continue to monitor vitals ,medication compliance and treatment side effects while patient is here.  Will monitor for medical issues as well as call consult as needed.  Reviewed EKG - QTC - wnl. PL pending. CSW will start working on disposition.  Patient to participate in therapeutic milieu .    Medical Decision Making:  Review of  Psycho-Social Stressors (1), Review or order clinical lab tests (1), Review and summation of old records (2), Established Problem, Worsening (2), Review of Last Therapy Session (1) and Review of New Medication or Change in Dosage (2)     Leevon Upperman Martinez 01/26/2015, 12:27 PM

## 2015-01-26 NOTE — [facility] Group Notes (Signed)
[facility] Group Notes:  (Counselor/Nursing/MHT/Case Management/Adjunct)  01/26/2015 1:15PM  Type of Therapy:  Group Therapy  Participation Level:  Active  Participation Quality:  Appropriate  Affect:  Flat  Cognitive:  Oriented  Insight:  Improving  Engagement in Group:  Limited  Engagement in Therapy:  Limited  Modes of Intervention:  Discussion, Exploration and Socialization  Summary of Progress/Problems: The topic for group was balance in life.  Pt participated in the discussion about when their life was in balance and out of balance and how this feels.  Pt discussed ways to get back in balance and short term goals they can work on to get where they want to be. Engaged throughout.  Stayed the entire time.  States he is balanced today, and knows this because he is not agitated and angry like he was prior to admission.  States to find balance, he takes medication Royetta Car is interesting, as he is historically non-compliant with meds outside of here.]   Also identified walking in the park as a way of finding balance-talked specifically about a couple of [place] parks that he enjoys.     Ida Rogue 01/26/2015 4:17 PM
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
## 2015-01-27 LAB — PROLACTIN: Prolactin: 20.7 ng/mL — ABNORMAL HIGH (ref 4.0–15.2)

## 2015-01-27 MED ORDER — PALIPERIDONE ER 6 MG PO TB24: 6.0000 mg | ORAL_TABLET | Freq: Every day | ORAL | Status: DC

## 2015-01-27 MED ORDER — NICOTINE 21 MG/24HR TD PT24: 21.0000 mg | MEDICATED_PATCH | Freq: Every day | TRANSDERMAL | Status: DC

## 2015-01-27 MED ORDER — BENZTROPINE MESYLATE 1 MG PO TABS: 1.0000 mg | ORAL_TABLET | Freq: Every day | ORAL | Status: DC

## 2015-01-27 MED ORDER — TRAZODONE HCL 50 MG PO TABS: 50.0000 mg | ORAL_TABLET | Freq: Every evening | ORAL | Status: DC | PRN

## 2015-01-27 MED ORDER — LAMOTRIGINE 25 MG PO TABS: 25.0000 mg | ORAL_TABLET | Freq: Every day | ORAL | Status: DC
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
## 2015-01-27 NOTE — [facility] Suicide Risk Assessment (Signed)
[facility] Discharge Suicide Risk Assessment   Demographic Factors:  Male  Total Time spent with patient: 30 minutes  Musculoskeletal: Strength & Muscle Tone: within normal limits Gait & Station: normal Patient leans: N/A  Psychiatric Specialty Exam: Physical Exam  Review of Systems  Psychiatric/Behavioral: Negative for depression, suicidal ideas and hallucinations. The patient is not nervous/anxious.   All other systems reviewed and are negative.   Blood pressure 137/83, pulse 111, temperature 97.7 F (36.5 C), temperature source Oral, resp. rate 16, height 5' 10.5" (1.791 m), weight 89.359 kg (197 lb), SpO2 100 %.Body mass index is 27.86 kg/(m^2).  General Appearance: Casual  Eye Contact::  Fair  Speech:  Clear and Coherent409  Volume:  Normal  Mood:  Euthymic  Affect:  Appropriate  Thought Process:  Coherent  Orientation:  Full (Time, Place, and Person)  Thought Content:  WDL  Suicidal Thoughts:  No  Homicidal Thoughts:  No  Memory:  Immediate;   Fair Recent;   Fair Remote;   Fair  Judgement:  Fair  Insight:  Fair  Psychomotor Activity:  Normal  Concentration:  Fair  Recall:  [employer] of Knowledge:Fair  Language: Fair  Akathisia:  No  Handed:  Right  AIMS (if indicated):     Assets:  Communication Skills  Sleep:  Number of Hours: 6.75  Cognition: WNL  ADL's:  Intact   Have you used any form of tobacco in the last 30 days? (Cigarettes, Smokeless Tobacco, Cigars, and/or Pipes): Yes  Has this patient used any form of tobacco in the last 30 days? (Cigarettes, Smokeless Tobacco, Cigars, and/or Pipes) Yes, Prescription provided with nicotine patch  Mental Status Per Nursing Assessment::   On Admission:     Current Mental Status by Physician: pt denies SI/HI/AH/VH  Loss Factors: NA  Historical Factors: Impulsivity  Risk Reduction Factors:   Positive social support  Continued Clinical Symptoms:  Previous Psychiatric Diagnoses and Treatments  Cognitive  Features That Contribute To Risk:  Polarized thinking    Suicide Risk:  Minimal: No identifiable suicidal ideation.  Patients presenting with no risk factors but with morbid ruminations; may be classified as minimal risk based on the severity of the depressive symptoms  Principal Problem: Schizoaffective disorder, bipolar type Discharge Diagnoses:  Patient Active Problem List   Diagnosis Date Noted  . 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 Cannabis use disorder, severe, dependence [F12.20] 08/17/2014  . Schizoaffective disorder, bipolar type [F25.0] 08/16/2014    Follow-up Information    Follow up with [facility] ACT team.   Why:  Lafonda Mosses will give you another appointment time and date when she comes to do your assessment here at the hospital   Contact information:   [address]  [place]  [336] [phone]      Plan Of Care/Follow-up recommendations:  Activity:  No restrictions Diet:  regular Tests:  Prolactin level to be followe per out pt recommendations Other:  None  Is patient on multiple antipsychotic therapies at discharge:  No   Has Patient had three or more failed trials of antipsychotic monotherapy by history:  No  Recommended Plan for Multiple Antipsychotic Therapies: NA    Paticia Moster md 01/27/2015, 9:17 AM

## 2015-01-27 NOTE — Discharge Summary (Signed)
Physician Discharge Summary Note  Patient:  Ronald Martinez is an 34 y.o., male MRN:  [id number] DOB:  1981-05-24 Patient phone:  [phone] (home)  Patient address:   [address] [place] [address],  Total Time spent with patient: 30 minutes  Date of Admission:  01/24/2015 Date of Discharge: 01/27/2015  Reason for Admission:  Mood stabilization treatments  Principal Problem: Schizoaffective disorder, bipolar type Discharge Diagnoses: Patient Active Problem List   Diagnosis Date Noted  . 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 Cannabis use disorder, severe, dependence [F12.20] 08/17/2014  . Schizoaffective disorder, bipolar type [F25.0] 08/16/2014    Musculoskeletal: Strength & Muscle Tone: within normal limits Gait & Station: normal Patient leans: N/A  Psychiatric Specialty Exam: Physical Exam  Psychiatric: He has a normal mood and affect. His speech is normal and behavior is normal. Judgment and thought content normal. Cognition and memory are normal.    Review of Systems  Constitutional: Negative.   HENT: Negative.   Eyes: Negative.   Respiratory: Negative.   Cardiovascular: Negative.   Gastrointestinal: Negative.   Genitourinary: Negative.   Musculoskeletal: Negative.   Skin: Negative.   Neurological: Negative.   Endo/Heme/Allergies: Negative.   Psychiatric/Behavioral: Positive for hallucinations (Stabilized with treatments). Negative for depression, suicidal ideas, memory loss and substance abuse. The patient is not nervous/anxious and does not have insomnia.     Blood pressure 137/83, pulse 111, temperature 97.7 F (36.5 C), temperature source Oral, resp. rate 16, height 5' 10.5" (1.791 m), weight 89.359 kg (197 lb), SpO2 100 %.Body mass index is 27.86 kg/(m^2).  See Physician SRA     Have you used any form of tobacco in the last 30 days? (Cigarettes, Smokeless Tobacco, Cigars, and/or Pipes): Yes  Has this patient used any form of  tobacco in the last 30 days? (Cigarettes, Smokeless Tobacco, Cigars, and/or Pipes) Yes, Prescription provided with nicotine patch  Past Medical History:  Past Medical History  Diagnosis Date  . Depression   . Schizophrenia   . Bipolar 1 disorder   . Asthma    History reviewed. No pertinent past surgical history. Family History:  Family History  Problem Relation Age of Onset  . Asthma Mother   . Alcoholism Other    Social History:  History  Alcohol Use  . Yes     History  Drug Use  . Yes  . Special: Marijuana, Benzodiazepines    Social History   Social History  . Marital Status: Single    Spouse Name: N/A  . Number of Children: N/A  . Years of Education: N/A   Social History Main Topics  . Smoking status: Current Every Day Smoker -- 1.00 packs/day    Types: Cigarettes  . Smokeless tobacco: None  . Alcohol Use: Yes  . Drug Use: Yes    Special: Marijuana, Benzodiazepines  . Sexual Activity: No   Other Topics Concern  . None   Social History Narrative    Risk to Self: Is patient at risk for suicide?: No Risk to Others:   Prior Inpatient Therapy:   Prior Outpatient Therapy:    Level of Care:  OP  Hospital Course:    Ronald Martinez is an 34 y.o. male who reports asking his friend to bring him to [facility]. The Patient presented orientated x3, mood "paranoid and PTSD", affect anxious and flat, Patient was drowsy and kept falling asleep during the initial assessments. Patient denied SI, HI, and VH and reports having paranoid thoughts  of "someone out to get me" and experiencing AH of a voice stating "you are hopeless." The Patient reports he is diagnosed with Schizoaffective Disorder and receives med management from [place]. He reports not going to [facility] or taking his medication(s) for the past month because "the side effects make me feel uncomfortable." The Patient reports he had a "falling out" with Friends" yesterday "which made me paranoid. Patient reports  being unemployed, receiving SSDI, and living alone.          Ronald Martinez was admitted to the adult 500 unit. He was evaluated and his symptoms were identified. Medication management was discussed and initiated. The patient was not listed as taking any psychiatric medications prior to admission. He was started on Invega 6 mg daily for psychotic symptoms. The medication Lamictal was started at 25 mg daily for improved stability of mood. He was oriented to the unit and encouraged to participate in unit programming. Medical problems were identified and treated appropriately. Home medication was restarted as needed.        The patient was evaluated each day by a clinical provider to ascertain the patient's response to treatment.  Improvement was noted by the patient's report of decreasing symptoms, improved sleep and appetite, affect, medication tolerance, behavior, and participation in unit programming.  He was asked each day to complete a self inventory noting mood, mental status, pain, new symptoms, anxiety and concerns.         He responded well to medication and being in a therapeutic and supportive environment. The patient continued to struggle with paranoia and delusional thoughts. The patient was noted to make improvements as he responded to his new medications. Patient educated that they take time to reach maximum therapeutic benefit. Positive and appropriate behavior was noted and the patient was motivated for recovery.  The patient worked closely with the treatment team and case manager to develop a discharge plan with appropriate goals. Coping skills, problem solving as well as relaxation therapies were also part of the unit programming.         By the day of discharge he was in much improved condition than upon admission.  Symptoms were reported as significantly decreased or resolved completely. The patient denied SI/HI and voiced no AVH. He was motivated to continue taking medication with a goal of  continued improvement in mental health. Ronald Martinez was discharged home with a plan to follow up as noted below. The patient was provided with sample medications and prescriptions at time of discharge. He left [facility] in stable condition with all belongings returned to him.   Consults:  psychiatry  Significant Diagnostic Studies:  Elevated Prolactin level, Chemistry panel, CBC, UDS positive for marijuana  Discharge Vitals:   Blood pressure 137/83, pulse 111, temperature 97.7 F (36.5 C), temperature source Oral, resp. rate 16, height 5' 10.5" (1.791 m), weight 89.359 kg (197 lb), SpO2 100 %. Body mass index is 27.86 kg/(m^2). Lab Results:   Results for orders placed or performed during the hospital encounter of 01/24/15 (from the past 72 hour(s))  Prolactin     Status: Abnormal   Collection Time: 01/26/15  6:28 AM  Result Value Ref Range   Prolactin 20.7 (H) 4.0 - 15.2 ng/mL    Comment: (NOTE) Performed At: [facility] [address] [place], [place] [id number] Mila Homer MD [id number] Performed at [facility]     Physical Findings: AIMS: Facial and Oral Movements Muscles of Facial Expression: None, normal Lips  and Perioral Area: None, normal Jaw: None, normal Tongue: None, normal,Extremity Movements Upper (arms, wrists, hands, fingers): None, normal Lower (legs, knees, ankles, toes): None, normal, Trunk Movements Neck, shoulders, hips: None, normal, Overall Severity Severity of abnormal movements (highest score from questions above): None, normal Incapacitation due to abnormal movements: None, normal Patient's awareness of abnormal movements (rate only patient's report): No Awareness, Dental Status Current problems with teeth and/or dentures?: No Does patient usually wear dentures?: No  CIWA:    COWS:      See Psychiatric Specialty Exam and Suicide Risk Assessment completed by Attending Physician prior to discharge.  Discharge  destination:  Home  Is patient on multiple antipsychotic therapies at discharge:  No   Has Patient had three or more failed trials of antipsychotic monotherapy by history:  No  Recommended Plan for Multiple Antipsychotic Therapies: NA      Discharge Instructions    Discharge instructions    Complete by:  As directed   Please follow up with Primary Provider for elevated Prolactin level.            Medication List    TAKE these medications      Indication   albuterol 108 (90 BASE) MCG/ACT inhaler  Commonly known as:  PROVENTIL HFA;VENTOLIN HFA  Inhale 2 puffs into the lungs every 4 (four) hours as needed for wheezing or shortness of breath.      benztropine 1 MG tablet  Commonly known as:  COGENTIN  Take 1 tablet (1 mg total) by mouth daily.   Indication:  Extrapyramidal Reaction caused by Medications     lamoTRIgine 25 MG tablet  Commonly known as:  LAMICTAL  Take 1 tablet (25 mg total) by mouth daily.   Indication:  Mood control     nicotine 21 mg/24hr patch  Commonly known as:  NICODERM CQ - dosed in mg/24 hours  Place 1 patch (21 mg total) onto the skin daily.   Indication:  Nicotine Addiction     paliperidone 6 MG 24 hr tablet  Commonly known as:  INVEGA  Take 1 tablet (6 mg total) by mouth daily.   Indication:  Schizoaffective Disorder     traZODone 50 MG tablet  Commonly known as:  DESYREL  Take 1 tablet (50 mg total) by mouth at bedtime and may repeat dose one time if needed.   Indication:  Aggressive Behavior, Trouble Sleeping       Follow-up Information    Follow up with [facility] ACT team.   Why:  Lafonda Mosses will give you another appointment time and date when she comes to do your assessment here at the hospital   Contact information:   [address]  [place]  [336] [phone]      Follow-up recommendations:    Activity: No restrictions Diet: regular Tests: Prolactin level to be followe per out pt recommendations Other: None  Comments:   Take  all your medications as prescribed by your mental healthcare provider.  Report any adverse effects and or reactions from your medicines to your outpatient provider promptly.  Patient is instructed and cautioned to not engage in alcohol and or illegal drug use while on prescription medicines.  In the event of worsening symptoms, patient is instructed to call the crisis hotline, 911 and or go to the nearest ED for appropriate evaluation and treatment of symptoms.  Follow-up with your primary care provider for your other medical issues, concerns and or health care needs.   Total Discharge  Time: Greater than 30 minutes  Signed: Fransisca Kaufmann, NP-C 01/27/2015, 1:24 PM

## 2015-01-27 NOTE — [facility] Suicide Risk Assessment (Signed)
[facility] INPATIENT:  Family/Significant Other Suicide Prevention Education  Suicide Prevention Education:  Education Completed; No one has been identified by the patient as the family member/significant other with whom the patient will be residing, and identified as the person(s) who will aid the patient in the event of a mental health crisis (suicidal ideations/suicide attempt).  With written consent from the patient, the family member/significant other has been provided the following suicide prevention education, prior to the and/or following the discharge of the patient.  The suicide prevention education provided includes the following:  Suicide risk factors  Suicide prevention and interventions  National Suicide Hotline telephone number  [facility] assessment telephone number  [facility] Emergency Assistance 911  [facility] and/or Residential Mobile Crisis Unit telephone number  Request made of family/significant other to:  Remove weapons (e.g., guns, rifles, knives), all items previously/currently identified as safety concern.    Remove drugs/medications (over-the-counter, prescriptions, illicit drugs), all items previously/currently identified as a safety concern.  The family member/significant other verbalizes understanding of the suicide prevention education information provided.  The family member/significant other agrees to remove the items of safety concern listed above.  The patient did not endorse SI at the time of admission, nor did the patient c/o SI during the stay here.  SPE not required. Pt was HI at admission, but denies now.  Ronald Martinez B 01/27/2015, 9:00 AM

## 2015-01-30 ENCOUNTER — Emergency Department (HOSPITAL_COMMUNITY)
Admission: EM | Admit: 2015-01-30 | Discharge: 2015-01-31 | Disposition: A | Discharge status: Home / Self Care | Payer: Medicaid Other | Attending: Emergency Medicine | Admitting: Emergency Medicine

## 2015-01-30 ENCOUNTER — Encounter (HOSPITAL_COMMUNITY): Payer: Self-pay | Admitting: Emergency Medicine

## 2015-01-30 DIAGNOSIS — R202 Paresthesia of skin: Secondary | ICD-10-CM | POA: Diagnosis present

## 2015-01-30 DIAGNOSIS — J45909 Unspecified asthma, uncomplicated: Secondary | ICD-10-CM | POA: Insufficient documentation

## 2015-01-30 DIAGNOSIS — Z720 Tobacco use: Secondary | ICD-10-CM | POA: Diagnosis not present

## 2015-01-30 DIAGNOSIS — Z79899 Other long term (current) drug therapy: Secondary | ICD-10-CM | POA: Diagnosis not present

## 2015-01-30 DIAGNOSIS — M722 Plantar fascial fibromatosis: Secondary | ICD-10-CM | POA: Insufficient documentation

## 2015-01-30 DIAGNOSIS — F319 Bipolar disorder, unspecified: Secondary | ICD-10-CM | POA: Insufficient documentation

## 2015-01-30 MED ORDER — NAPROXEN 250 MG PO TABS: 250.0000 mg | ORAL_TABLET | Freq: Two times a day (BID) | ORAL | Status: DC

## 2015-01-30 NOTE — ED Notes (Signed)
Pt. reports feet pain worse when walking for several weeks , denies injury , ambulatory .

## 2015-01-30 NOTE — ED Provider Notes (Signed)
CSN: [id number]     Arrival date & time 01/30/15  2330 History  This chart was scribed for non-physician practitioner, Everlene Farrier, PA-C working with Dione Booze, MD by Gwenyth Ober, ED scribe. This patient was seen in room TR09C/TR09C and the patient's care was started at 11:51 PM   No chief complaint on file.  The history is provided by the patient. No language interpreter was used.   HPI Comments: Ronald Martinez is a 34 y.o. male who presents to the Emergency Department complaining of constant, 7/10, pain to the plantar aspects of his bilateral feet that started several weeks ago. He states tingling of his right distal foot and multiple calluses to the affected areas as associated symptoms. His pain becomes worse with walking and is worse in the morning. He denies any trauma or injury. Pt has not tried any treatment PTA. Pt denies recent injuries. He also denies any numbness as an associated symptom.   Past Medical History  Diagnosis Date  . Depression   . Schizophrenia   . Bipolar 1 disorder   . Asthma    History reviewed. No pertinent past surgical history. Family History  Problem Relation Age of Onset  . Asthma Mother   . Alcoholism Other    Social History  Substance Use Topics  . Smoking status: Current Every Day Smoker -- 1.00 packs/day    Types: Cigarettes  . Smokeless tobacco: None  . Alcohol Use: Yes    Review of Systems  Constitutional: Negative for fever.  Cardiovascular: Negative for leg swelling.  Musculoskeletal: Positive for arthralgias. Negative for gait problem.  Skin: Negative for color change, rash and wound.  Neurological: Negative for numbness.      Allergies  Salami  Home Medications   Prior to Admission medications   Medication Sig Start Date End Date Taking? Authorizing Provider  albuterol (PROVENTIL HFA;VENTOLIN HFA) 108 (90 BASE) MCG/ACT inhaler Inhale 2 puffs into the lungs every 4 (four) hours as needed for wheezing or shortness of  breath. 01/05/15   Pricilla Loveless, MD  benztropine (COGENTIN) 1 MG tablet Take 1 tablet (1 mg total) by mouth daily. 01/27/15   Thermon Leyland, NP  lamoTRIgine (LAMICTAL) 25 MG tablet Take 1 tablet (25 mg total) by mouth daily. 01/27/15   Thermon Leyland, NP  naproxen (NAPROSYN) 250 MG tablet Take 1 tablet (250 mg total) by mouth 2 (two) times daily with a meal. 01/30/15   Everlene Farrier, PA-C  nicotine (NICODERM CQ - DOSED IN MG/24 HOURS) 21 mg/24hr patch Place 1 patch (21 mg total) onto the skin daily. 01/27/15   Thermon Leyland, NP  paliperidone (INVEGA) 6 MG 24 hr tablet Take 1 tablet (6 mg total) by mouth daily. 01/27/15   Thermon Leyland, NP  traZODone (DESYREL) 50 MG tablet Take 1 tablet (50 mg total) by mouth at bedtime and may repeat dose one time if needed. 01/27/15   Thermon Leyland, NP   BP 124/64 mmHg  Pulse 69  Temp(Src) 97.9 F (36.6 C) (Oral)  Resp 16  SpO2 100% Physical Exam  Constitutional: He appears well-developed and well-nourished. No distress.  Nontoxic appearing.  HENT:  Head: Normocephalic and atraumatic.  Eyes: Conjunctivae are normal. Right eye exhibits no discharge. Left eye exhibits no discharge.  Cardiovascular: Normal rate and intact distal pulses.   Pulses:      Dorsalis pedis pulses are 2+ on the right side, and 2+ on the left side.  Posterior tibial pulses are 2+ on the right side, and 2+ on the left side.  Good capillary refill of his distal toes to his bilateral feet.  Pulmonary/Chest: Effort normal. No respiratory distress.  Musculoskeletal: Normal range of motion. He exhibits tenderness. He exhibits no edema.  Mild tenderness to the plantar aspect of his bilateral feet No edema, erythema, warmth, deformity or ecchymosis Good sensation and strength of his bilateral feet No calf edema or tenderness.   Neurological: He is alert. Coordination normal.  Sensation is intact in his bilateral lower extremities.  Skin: Skin is warm and dry. No rash noted. He is not  diaphoretic. No erythema. No pallor.  Psychiatric: He has a normal mood and affect. His behavior is normal.  Nursing note and vitals reviewed.   ED Course  Procedures   DIAGNOSTIC STUDIES: Oxygen Saturation is 100% on RA, normal by my interpretation.    COORDINATION OF CARE: 11:58 PM Discussed treatment plan with pt. He agreed to plan.   Labs Review Labs Reviewed - No data to display  Imaging Review No results found.   EKG Interpretation None      Filed Vitals:   01/30/15 2334  BP: 124/64  Pulse: 69  Temp: 97.9 F (36.6 C)  TempSrc: Oral  Resp: 16  SpO2: 100%     MDM   Meds given in ED:  Medications - No data to display  New Prescriptions   NAPROXEN (NAPROSYN) 250 MG TABLET    Take 1 tablet (250 mg total) by mouth 2 (two) times daily with a meal.    Final diagnoses:  Plantar fasciitis   This is a 34 year old male who presents to the emergency department complaining of pain to the plantar aspect of his bilateral feet ongoing for several weeks. He reports that his pain is worse with ambulating and worse in the morning. He denies any injury or trauma. On exam the patient is afebrile and nontoxic appearing. There is no obvious deformity or edema noted to his feet. He does have mild tenderness to the plantar aspect of his bilateral feet. No calf edema or tenderness. The patient is able to ambulate without difficulty or assistance. Patient appears to have plantar fasciitis. We'll discharge patient prescriptions for naproxen. I advised the patient to follow-up with their primary care provider this week. I advised the patient to return to the emergency department with new or worsening symptoms or new concerns. The patient verbalized understanding and agreement with plan.    I personally performed the services described in this documentation, which was scribed in my presence. The recorded information has been reviewed and is accurate.    Everlene Farrier, PA-C 01/31/15  0013  Dione Booze, MD 01/31/15 [phone]

## 2015-01-31 NOTE — Discharge Instructions (Signed)
Plantar Fasciitis Plantar fasciitis is a common condition that causes foot pain. It is soreness (inflammation) of the band of tough fibrous tissue on the bottom of the foot that runs from the heel bone (calcaneus) to the ball of the foot. The cause of this soreness may be from excessive standing, poor fitting shoes, running on hard surfaces, being overweight, having an abnormal walk, or overuse (this is common in runners) of the painful foot or feet. It is also common in aerobic exercise dancers and ballet dancers. SYMPTOMS  Most people with plantar fasciitis complain of:  Severe pain in the morning on the bottom of their foot especially when taking the first steps out of bed. This pain recedes after a few minutes of walking.  Severe pain is experienced also during walking following a long period of inactivity.  Pain is worse when walking barefoot or up stairs DIAGNOSIS   Your caregiver will diagnose this condition by examining and feeling your foot.  Special tests such as X-rays of your foot, are usually not needed. PREVENTION   Consult a sports medicine professional before beginning a new exercise program.  Walking programs offer a good workout. With walking there is a lower chance of overuse injuries common to runners. There is less impact and less jarring of the joints.  Begin all new exercise programs slowly. If problems or pain develop, decrease the amount of time or distance until you are at a comfortable level.  Wear good shoes and replace them regularly.  Stretch your foot and the heel cords at the back of the ankle (Achilles tendon) both before and after exercise.  Run or exercise on even surfaces that are not hard. For example, asphalt is better than pavement.  Do not run barefoot on hard surfaces.  If using a treadmill, vary the incline.  Do not continue to workout if you have foot or joint problems. Seek professional help if they do not improve. HOME CARE INSTRUCTIONS     Avoid activities that cause you pain until you recover.  Use ice or cold packs on the problem or painful areas after working out.  Only take over-the-counter or prescription medicines for pain, discomfort, or fever as directed by your caregiver.  Soft shoe inserts or athletic shoes with air or gel sole cushions may be helpful.  If problems continue or become more severe, consult a sports medicine caregiver or your own health care provider. Cortisone is a potent anti-inflammatory medication that may be injected into the painful area. You can discuss this treatment with your caregiver. MAKE SURE YOU:   Understand these instructions.  Will watch your condition.  Will get help right away if you are not doing well or get worse. Document Released: 02/12/2001 Document Revised: 08/12/2011 Document Reviewed: 04/13/2008 [facility] Patient Information 2015 [place], [place]. This information is not intended to replace advice given to you by your health care provider. Make sure you discuss any questions you have with your health care provider.  Please apply lotion twice a day to your feet to help with your calluses.

## 2015-02-26 ENCOUNTER — Encounter (HOSPITAL_COMMUNITY): Payer: Self-pay | Admitting: Emergency Medicine

## 2015-02-26 ENCOUNTER — Emergency Department (HOSPITAL_COMMUNITY)
Admission: EM | Admit: 2015-02-26 | Discharge: 2015-02-26 | Disposition: A | Discharge status: Home / Self Care | Payer: Medicaid Other | Attending: Emergency Medicine | Admitting: Emergency Medicine

## 2015-02-26 DIAGNOSIS — F209 Schizophrenia, unspecified: Secondary | ICD-10-CM | POA: Insufficient documentation

## 2015-02-26 DIAGNOSIS — Z79899 Other long term (current) drug therapy: Secondary | ICD-10-CM | POA: Insufficient documentation

## 2015-02-26 DIAGNOSIS — K088 Other specified disorders of teeth and supporting structures: Secondary | ICD-10-CM | POA: Insufficient documentation

## 2015-02-26 DIAGNOSIS — K0889 Other specified disorders of teeth and supporting structures: Secondary | ICD-10-CM

## 2015-02-26 DIAGNOSIS — Z720 Tobacco use: Secondary | ICD-10-CM | POA: Diagnosis not present

## 2015-02-26 DIAGNOSIS — J45909 Unspecified asthma, uncomplicated: Secondary | ICD-10-CM | POA: Diagnosis not present

## 2015-02-26 DIAGNOSIS — F319 Bipolar disorder, unspecified: Secondary | ICD-10-CM | POA: Diagnosis not present

## 2015-02-26 MED ORDER — PENICILLIN V POTASSIUM 500 MG PO TABS: 500.0000 mg | ORAL_TABLET | Freq: Three times a day (TID) | ORAL | Status: DC

## 2015-02-26 MED ORDER — IBUPROFEN 600 MG PO TABS: 600.0000 mg | ORAL_TABLET | Freq: Three times a day (TID) | ORAL | Status: DC | PRN

## 2015-02-26 MED ORDER — IBUPROFEN 800 MG PO TABS
800.0000 mg | ORAL_TABLET | Freq: Once | ORAL | Status: AC
  Administered 2015-02-26: 800 mg via ORAL
  Filled 2015-02-26: qty 1

## 2015-02-26 NOTE — ED Notes (Signed)
Pt reports "crucial tooth ache" since Friday evening, states L sided pain "somewhere". Denies regular dentist. Cold sensitivity. Aspirin, ibuprofen not effective.

## 2015-02-26 NOTE — Discharge Instructions (Signed)
Dental Pain °A tooth ache may be caused by cavities (tooth decay). Cavities expose the nerve of the tooth to air and hot or cold temperatures. It may come from an infection or abscess (also called a boil or furuncle) around your tooth. It is also often caused by dental caries (tooth decay). This causes the pain you are having. °DIAGNOSIS  °Your caregiver can diagnose this problem by exam. °TREATMENT  °· If caused by an infection, it may be treated with medications which kill germs (antibiotics) and pain medications as prescribed by your caregiver. Take medications as directed. °· Only take over-the-counter or prescription medicines for pain, discomfort, or fever as directed by your caregiver. °· Whether the tooth ache today is caused by infection or dental disease, you should see your dentist as soon as possible for further care. °SEEK MEDICAL CARE IF: °The exam and treatment you received today has been provided on an emergency basis only. This is not a substitute for complete medical or dental care. If your problem worsens or new problems (symptoms) appear, and you are unable to meet with your dentist, call or return to this location. °SEEK IMMEDIATE MEDICAL CARE IF:  °· You have a fever. °· You develop redness and swelling of your face, jaw, or neck. °· You are unable to open your mouth. °· You have severe pain uncontrolled by pain medicine. °MAKE SURE YOU:  °· Understand these instructions. °· Will watch your condition. °· Will get help right away if you are not doing well or get worse. °Document Released: 05/20/2005 Document Revised: 08/12/2011 Document Reviewed: 01/06/2008 °ExitCare® Patient Information ©2015 [employer]. This information is not intended to replace advice given to you by your health care provider. Make sure you discuss any questions you have with your health care provider. ° °Emergency Department Resource Guide °1) Find a Doctor and Pay Out of Pocket °Although you won't have to find out who  is covered by your insurance plan, it is a good idea to ask around and get recommendations. You will then need to call the office and see if the doctor you have chosen will accept you as a new patient and what types of options they offer for patients who are self-pay. Some doctors offer discounts or will set up payment plans for their patients who do not have insurance, but you will need to ask so you aren't surprised when you get to your appointment. ° °2) Contact Your Local Health Department °Not all health departments have doctors that can see patients for sick visits, but many do, so it is worth a call to see if yours does. If you don't know where your local health department is, you can check in your phone book. The CDC also has a tool to help you locate your state's health department, and many state websites also have listings of all of their local health departments. ° °3) Find a Walk-in Clinic °If your illness is not likely to be very severe or complicated, you may want to try a walk in clinic. These are popping up all over the country in pharmacies, drugstores, and shopping centers. They're usually staffed by nurse practitioners or physician assistants that have been trained to treat common illnesses and complaints. They're usually fairly quick and inexpensive. However, if you have serious medical issues or chronic medical problems, these are probably not your best option. ° °No Primary Care Doctor: °- Call [facility] Connect at  832-8000 - they can help you locate a primary   care doctor that  accepts your insurance, provides certain services, etc. °- Physician Referral Service- 1-800-533-3463 ° °Chronic Pain Problems: °Organization         Address  Phone   Notes  °[facility]  (336) 297-2271 Patients need to be referred by their primary care doctor.  ° °Medication Assistance: °Organization         Address  Phone   Notes  °[place] [facility] [address]  [address] °[place], [place] [address] (336) 641-8030 --Must be a resident of [place] °-- Must have NO insurance coverage whatsoever (no Medicaid/ Medicare, etc.) °-- The pt. MUST have a primary care doctor that directs their care regularly and follows them in the community °  °[facility]  (866) 331-1348   °[employer]  (888) 892-1162   ° °Agencies that provide inexpensive medical care: °Organization         Address  Phone   Notes  °[facility]  (336) 832-8035   °[facility]    (336) 832-7272   °[facility] [address] °[place], [place] [address] (336) 832-4777   °[facility] [address], °[place] (336) 271-4999   °[facility]    (336) 373-0678   °[facility]    (336) 272-1050   °[facility] ° [address], [place] Phone:  (336) 832-4444, Fax:  (336) 832-4440 Hours of Operation:  9 am - 6 pm, M-F.  Also accepts Medicaid/Medicare and self-pay.  °[facility] ° [address], [place] Phone: (336) 832-3150, Fax: (336) [phone]. Hours of Operation:  8:30 am - 5:30 pm, M-F.  Also accepts Medicaid and self-pay.  °[facility] [place] [address], [place] Phone: (336) 878-6027   °[facility] [address], [place], [place] (336)723-1848, Ext. 123 Mondays & Thursdays: 7-9 AM.  First 15 patients are seen on a first come, first serve basis. °  ° °Medicaid-accepting [place] Providers: ° °Organization         Address  Phone   Notes  °[facility] [address], [place] (336) 641-2100 Also accepts self-pay patients.  °[facility] [address], [place] ° (336) 856-9996   °[facility] [address], [place] (336) 288-8857   °[facility] [address] [place] [address], [place] (336) 299-7000   °Veita Bland [address], [place]  ° (336) 373-1557 Only accepts Junior Access Medicaid patients after they have their name applied to their card.  ° °Self-Pay (no insurance) in [place]: ° °Organization         Address  Phone   Notes  °Sickle Cell Patients, [facility] [address], [place] (336) 832-1970   °[facility] [address], [place] (336) 832-4400   °[facility] ° [address], [place] (336) 992-4800   °[facility]/Dr. Osei-Bonsu ° [address] [place] [address], [place] or [address], [place] (336) [phone] Phone number for both [place] and [place] locations is the same.  °[facility] [address], [place] (336) 299-0000   °[facility] [address] [place] [address], [place] or [address] (336) 852-7530 °(336) 878-2260   °  [facility] [address], [place] (336) 350-1642, phone; (336) 294-5005, fax Sees patients 1st and 3rd Saturday of every month.  Must not qualify for public or private insurance (i.e. Medicaid, Medicare, [place] Health Choice, Veterans' Benefits) • Household income should be no more than 200% of the poverty level •The clinic cannot treat you if you are pregnant or think you are pregnant • Sexually transmitted diseases are not treated at the clinic.  ° ° °Dental Care: °Organization         Address  Phone  Notes  °[facility] [address], [place] (336) 641-6152 Accepts children up to age 21 who are enrolled in Medicaid or [place] Health Choice; pregnant women with a Medicaid card; and children who have applied for Medicaid or [place] Health Choice, but were declined, whose parents can pay a reduced fee at time of service.  °[facility] [place]  [address], [place] (336) 641-7733 Accepts children up to age 21 who are  enrolled in Medicaid or [place] Health Choice; pregnant women with a Medicaid card; and children who have applied for Medicaid or [place] Health Choice, but were declined, whose parents can pay a reduced fee at time of service.  °[facility] ° [address], [place] (336) 641-4533 Patients are seen by appointment only. Walk-ins are not accepted. [facility] will see patients 18 years of age and older. °Monday - Tuesday (8am-5pm) °Most Wednesdays (8:30-5pm) °$30 per visit, cash only  °[facility] ° [address], [place] (336) 641-4533 Patients are seen by appointment only. Walk-ins are not accepted. [facility] will see patients 18 years of age and older. °One Wednesday Evening (Monthly: Volunteer Based).  $30 per visit, cash only  °[facility]  (919) 537-3737 for adults; Children under age 4, call Graduate Pediatric Dentistry at (919) [phone]. Children aged 4-14, please call (919) [phone] to request a pediatric application. ° Dental services are provided in all areas of dental care including fillings, crowns and bridges, complete and partial dentures, implants, gum treatment, root canals, and extractions. Preventive care is also provided. Treatment is provided to both adults and children. °Patients are selected via a lottery and there is often a waiting list. °  °[facility] [address], ° ° (336) 763-8833 [email or web address] °  °[facility] [address], [place], [place] (336)723-1848, Ext. 123 Second and Fourth Thursday of each month, opens at 6:30 AM; Clinic ends at 9 AM.  Patients are seen on a first-come first-served basis, and a limited number are seen during each clinic.  ° °[facility] ° [address], [place], [place] (336) 723-7904   Eligibility Requirements °You must have lived in [place], [place], or [place] counties for at least the last three months. °  You  cannot be eligible for state or federal sponsored healthcare insurance, including Veterans Administration, Medicaid, or Medicare. °  You generally cannot be eligible for healthcare insurance through your employer.  °  How to apply: °Eligibility screenings are held every Tuesday and Wednesday afternoon from 1:00 pm until 4:00 pm. You do not need an appointment for the interview!  °[facility] [address], [place], [place] 336-631-2330   °[facility] [facility]  336-342-8273   °[facility]  336-703-3100   °[facility]  336-570-6415   ° °Behavioral Health Resources in the Community: °Intensive Outpatient Programs °Organization         Address  Phone  Notes  °[facility] [address], [place], [place] 336-878-6098   °[facility] [address], [place], [place] 336-832-9800   °[facility] [address], [place], [place] ° 336-882-2125   °[facility] [address],  °[place], [place] 1-800-853-5163 or 336-641-4981   °Substance Abuse Resources °Organization         Address  Phone  Notes  °[facility]  336-882-2125   °[facility]  336-784-9470   °[facility]  336-285-9073   °[facility]  336-845-3988   °Residential & Outpatient Substance Abuse Program  1-800-659-3381   °Psychological Services °Organization         Address  Phone  Notes  °[facility]  336- 832-9600   °[facility]  336- 378-7881   °[facility] [address], [place] 1-800-853-5163 or 336-641-4981   ° °Mobile Crisis Teams °Organization         Address  Phone  Notes  °[facility], Mobile Crisis Care Unit  1-877-626-1772   °Assertive °[facility] ° [address] [place],  [place] 336-834-9664   °Sharon DeEsch [address] °[place] 336-554-5454   ° °Self-Help/Support  Groups °Organization         Address  Phone             Notes  °[facility] [place] - variety of support groups  336- 373-1402 Call for more information  °Narcotics Anonymous (NA), [facility] [address], °[place]  2 meetings at this location  ° °Residential Treatment Programs °Organization         Address  Phone  Notes  °[facility] [address],    °[place]  1-866-801-8205   °[facility] ° [address], [place], [place] 704-293-8524   °[facility] [address], [place] 336-845-3988 Admissions: 8am-3pm M-F  °[facility] [address]    °[place], [place] 336-841-1104   °[facility] [address], [place], [place] 336-379-7146   °[facility] [address]  °[place], [place] 336-285-9073   °[facility] - Intensive Outpatient [address], [place], [place] 336-852-3033   °[facility] [address]  °[place], [place] 1-877-615-2722 or 336-784-9470   °[facility] [address] [place], [place] 336-227-7417 Accepts Medicaid  °[facility] [address]  °[place] 1-800-659-3381 Substance Abuse/Addiction Treatment  ° °[place] Behavioral Health Resources °Organization         Address  Phone  Notes  °[facility]  (888) 581-9988   °Julie Brannon, PhD [address] [place], [place]   (336) 349-5553 or (336) 951-0000   °[facility]   [address] °[place], [place] (336) 349-4454   °[facility] [address], [place], [place] (336) 342-8316 Insurance/Medicaid/sponsorship through [facility]  °[facility] [address]                                    [place],  (336) 342-8316 Therapy/tele-psych/case  °[facility]   [address]  ° [place], [place] (336) 349-2233    °Dr. Arfeen  (336) 349-4544   °[facility] [place]  [facility]  [employer] [place] [facility] 1) [address], [place] °2) [address], [place] °3)  [address], [place] (336) 349-3220 °(336) 342-7768 ° °(336) 342-8140   °[place] Child Abuse [facility] (336) 342-1394 or (336) 342-3537 (After Hours)    ° ° ° °

## 2015-02-26 NOTE — ED Provider Notes (Signed)
CSN: [id number]     Arrival date & time 02/26/15  0228 History   First MD Initiated Contact with Patient 02/26/15 0530     Chief Complaint  Patient presents with  . Dental Pain      HPI Patient reports left-sided facial and dental pain over the past 48 hours.  He reports pain in his left lower jaw.  No fevers or chills.  No difficulty breathing or swallowing.  No fevers or chills.  No facial swelling.  No other complaints.  Pain is moderate in severity   Past Medical History  Diagnosis Date  . Depression   . Schizophrenia   . Bipolar 1 disorder   . Asthma    History reviewed. No pertinent past surgical history. Family History  Problem Relation Age of Onset  . Asthma Mother   . Alcoholism Other    Social History  Substance Use Topics  . Smoking status: Current Every Day Smoker -- 1.00 packs/day    Types: Cigarettes  . Smokeless tobacco: None  . Alcohol Use: Yes    Review of Systems  All other systems reviewed and are negative.     Allergies  Salami  Home Medications   Prior to Admission medications   Medication Sig Start Date End Date Taking? Authorizing Provider  albuterol (PROVENTIL HFA;VENTOLIN HFA) 108 (90 BASE) MCG/ACT inhaler Inhale 2 puffs into the lungs every 4 (four) hours as needed for wheezing or shortness of breath. 01/05/15   Pricilla Loveless, MD  benztropine (COGENTIN) 1 MG tablet Take 1 tablet (1 mg total) by mouth daily. 01/27/15   Thermon Leyland, NP  lamoTRIgine (LAMICTAL) 25 MG tablet Take 1 tablet (25 mg total) by mouth daily. 01/27/15   Thermon Leyland, NP  naproxen (NAPROSYN) 250 MG tablet Take 1 tablet (250 mg total) by mouth 2 (two) times daily with a meal. 01/30/15   Everlene Farrier, PA-C  nicotine (NICODERM CQ - DOSED IN MG/24 HOURS) 21 mg/24hr patch Place 1 patch (21 mg total) onto the skin daily. 01/27/15   Thermon Leyland, NP  paliperidone (INVEGA) 6 MG 24 hr tablet Take 1 tablet (6 mg total) by mouth daily. 01/27/15   Thermon Leyland, NP  traZODone  (DESYREL) 50 MG tablet Take 1 tablet (50 mg total) by mouth at bedtime and may repeat dose one time if needed. 01/27/15   Thermon Leyland, NP   BP 119/74 mmHg  Pulse 66  Temp(Src) 97.4 F (36.3 C) (Oral)  Resp 20  SpO2 100% Physical Exam  Constitutional: He is oriented to person, place, and time. He appears well-developed and well-nourished.  HENT:  Head: Normocephalic.  Left lower second molar with obvious dental decay.  No gingival swelling or fluctuance.  Tolerating secretions.  Oral airway patent.  No left-sided facial swelling.  No trismus  Eyes: EOM are normal.  Neck: Normal range of motion.  Pulmonary/Chest: Effort normal.  Abdominal: He exhibits no distension.  Musculoskeletal: Normal range of motion.  Neurological: He is alert and oriented to person, place, and time.  Psychiatric: He has a normal mood and affect.  Nursing note and vitals reviewed.   ED Course  Procedures (including critical care time) Labs Review Labs Reviewed - No data to display  Imaging Review No results found. I have personally reviewed and evaluated these images and lab results as part of my medical decision-making.   EKG Interpretation None      MDM   Final diagnoses:  None  Dental Pain. Home with antibiotics and pain medicine. Recommend dental follow up. No signs of gingival abscess. Tolerating secretions. Airway patent. No sub lingular swelling     Azalia Bilis, MD 02/26/15 [phone]

## 2015-03-05 ENCOUNTER — Emergency Department (HOSPITAL_COMMUNITY)
Admission: EM | Admit: 2015-03-05 | Discharge: 2015-03-05 | Disposition: A | Discharge status: Home / Self Care | Payer: Medicaid Other | Attending: Emergency Medicine | Admitting: Emergency Medicine

## 2015-03-05 ENCOUNTER — Encounter (HOSPITAL_COMMUNITY): Payer: Self-pay

## 2015-03-05 ENCOUNTER — Emergency Department (HOSPITAL_COMMUNITY): Payer: Medicaid Other

## 2015-03-05 DIAGNOSIS — Z791 Long term (current) use of non-steroidal anti-inflammatories (NSAID): Secondary | ICD-10-CM | POA: Insufficient documentation

## 2015-03-05 DIAGNOSIS — Z79899 Other long term (current) drug therapy: Secondary | ICD-10-CM | POA: Insufficient documentation

## 2015-03-05 DIAGNOSIS — Z720 Tobacco use: Secondary | ICD-10-CM | POA: Insufficient documentation

## 2015-03-05 DIAGNOSIS — J45901 Unspecified asthma with (acute) exacerbation: Secondary | ICD-10-CM | POA: Diagnosis not present

## 2015-03-05 DIAGNOSIS — R05 Cough: Secondary | ICD-10-CM | POA: Diagnosis present

## 2015-03-05 DIAGNOSIS — F319 Bipolar disorder, unspecified: Secondary | ICD-10-CM | POA: Diagnosis not present

## 2015-03-05 DIAGNOSIS — K0889 Other specified disorders of teeth and supporting structures: Secondary | ICD-10-CM | POA: Insufficient documentation

## 2015-03-05 DIAGNOSIS — Z792 Long term (current) use of antibiotics: Secondary | ICD-10-CM | POA: Insufficient documentation

## 2015-03-05 MED ORDER — PREDNISONE 10 MG PO TABS: 20.0000 mg | ORAL_TABLET | Freq: Two times a day (BID) | ORAL | Status: DC

## 2015-03-05 MED ORDER — PENICILLIN V POTASSIUM 500 MG PO TABS: 500.0000 mg | ORAL_TABLET | Freq: Three times a day (TID) | ORAL | Status: DC

## 2015-03-05 MED ORDER — ALBUTEROL SULFATE (2.5 MG/3ML) 0.083% IN NEBU
5.0000 mg | INHALATION_SOLUTION | Freq: Once | RESPIRATORY_TRACT | Status: AC
  Administered 2015-03-05: 5 mg via RESPIRATORY_TRACT
  Filled 2015-03-05: qty 6

## 2015-03-05 NOTE — ED Provider Notes (Signed)
CSN: [id number]     Arrival date & time 03/05/15  0230 History   By signing my name below, I, Terrance Branch, attest that this documentation has been prepared under the direction and in the presence of Geoffery Lyons, MD. Electronically Signed: Evon Slack, ED Scribe. 03/05/2015. 3:36 AM.      Chief Complaint  Patient presents with  . Asthma   Patient is a 34 y.o. male presenting with asthma. The history is provided by the patient. No language interpreter was used.  Asthma This is a recurrent problem. The current episode started more than 2 days ago. Associated symptoms include shortness of breath. Nothing relieves the symptoms. He has tried nothing for the symptoms.   HPI Comments: Ronald Martinez is a 34 y.o. male who presents to the Emergency Department complaining of SOB onset 1 week prior. Pt states he has associated cough and wheezing. Pt denies recent steroid use or inhaler use. Pt is also complaining of left gingival pain. Pt does have a cracked tooth on the lower left side. Pt doesn't report any other symptoms.   Past Medical History  Diagnosis Date  . Depression   . Schizophrenia (HCC)   . Bipolar 1 disorder (HCC)   . Asthma    History reviewed. No pertinent past surgical history. Family History  Problem Relation Age of Onset  . Asthma Mother   . Alcoholism Other    Social History  Substance Use Topics  . Smoking status: Current Some Day Smoker -- 1.00 packs/day    Types: Cigarettes  . Smokeless tobacco: None  . Alcohol Use: Yes    Review of Systems  Constitutional: Negative for fever.  HENT: Positive for dental problem.   Respiratory: Positive for cough, shortness of breath and wheezing.   All other systems reviewed and are negative.    Allergies  Salami  Home Medications   Prior to Admission medications   Medication Sig Start Date End Date Taking? Authorizing Provider  albuterol (PROVENTIL HFA;VENTOLIN HFA) 108 (90 BASE) MCG/ACT inhaler Inhale 2 puffs  into the lungs every 4 (four) hours as needed for wheezing or shortness of breath. 01/05/15   Pricilla Loveless, MD  benztropine (COGENTIN) 1 MG tablet Take 1 tablet (1 mg total) by mouth daily. 01/27/15   Thermon Leyland, NP  ibuprofen (ADVIL,MOTRIN) 600 MG tablet Take 1 tablet (600 mg total) by mouth every 8 (eight) hours as needed. 02/26/15   Azalia Bilis, MD  lamoTRIgine (LAMICTAL) 25 MG tablet Take 1 tablet (25 mg total) by mouth daily. 01/27/15   Thermon Leyland, NP  naproxen (NAPROSYN) 250 MG tablet Take 1 tablet (250 mg total) by mouth 2 (two) times daily with a meal. 01/30/15   Everlene Farrier, PA-C  nicotine (NICODERM CQ - DOSED IN MG/24 HOURS) 21 mg/24hr patch Place 1 patch (21 mg total) onto the skin daily. 01/27/15   Thermon Leyland, NP  paliperidone (INVEGA) 6 MG 24 hr tablet Take 1 tablet (6 mg total) by mouth daily. 01/27/15   Thermon Leyland, NP  penicillin v potassium (VEETID) 500 MG tablet Take 1 tablet (500 mg total) by mouth 3 (three) times daily. 02/26/15   Azalia Bilis, MD  traZODone (DESYREL) 50 MG tablet Take 1 tablet (50 mg total) by mouth at bedtime and may repeat dose one time if needed. 01/27/15   Thermon Leyland, NP   BP 128/84 mmHg  Pulse 66  Temp(Src) 98.8 F (37.1 C) (Oral)  Resp 26  Ht   (1.803 m)  Wt 199 lb 2 oz (90.323 kg)  BMI 27.78 kg/m2  SpO2 100%   Physical Exam  Constitutional: He is oriented to person, place, and time. He appears well-developed and well-nourished. No distress.  HENT:  Head: Normocephalic and atraumatic.  The left lower 2nd molar has decay with missing section of enamel. There is slight gingival inflammation surrounding it but however no abscess.   Eyes: Conjunctivae and EOM are normal.  Neck: Neck supple. No tracheal deviation present.  Cardiovascular: Normal rate.   Pulmonary/Chest: Effort normal. No respiratory distress. He has wheezes.  Slight expiratory wheezing bilaterally.   Musculoskeletal: Normal range of motion.  Neurological: He is  alert and oriented to person, place, and time.  Skin: Skin is warm and dry.  Psychiatric: He has a normal mood and affect. His behavior is normal.  Nursing note and vitals reviewed.   ED Course  Procedures (including critical care time) DIAGNOSTIC STUDIES: Oxygen Saturation is 100% on RA, normal by my interpretation.    COORDINATION OF CARE: 3:55 AM-Discussed treatment plan with pt at bedside and pt agreed to plan.     Labs Review Labs Reviewed - No data to display  Imaging Review No results found.    EKG Interpretation   Date/Time:  Sunday March 05 2015 02:38:30 EDT Ventricular Rate:  67 PR Interval:  152 QRS Duration: 80 QT Interval:  360 QTC Calculation: 380 R Axis:   79 Text Interpretation:  Normal sinus rhythm Normal ECG Confirmed by Marlina Cataldi   MD, Javelle Donigan ([id number]) on 03/05/2015 4:14:11 AM      MDM   Final diagnoses:  None       Patient is a 34 year old male with history of asthma. He presents for evaluation of shortness of breath and wheezing. He does have expiratory wheezes on presentation, however she x-ray is clear. He was given 2 albuterol treatments and his wheezing has improved. He will be treated with steroids and continued albuterol.  He is also complaining of pain in his left lower jaw. He has a molar that appears to be decayed with a missing section of enamel. I suspect that this is the cause of his discomfort. This will be treated with penicillin. He is to follow-up as needed.   I personally performed the services described in this documentation, which was scribed in my presence. The recorded information has been reviewed and is accurate.      Geoffery Lyons, MD 03/05/15 [phone]

## 2015-03-05 NOTE — ED Notes (Signed)
Pt verbalized understanding of d/c instructions and no further questions. Pt stable and NAD.  

## 2015-03-05 NOTE — ED Notes (Signed)
Pt has asthma and feels like the last 3 days it has been bothering him. Doesn't have an inhaler. States he has been feeling SOB. Has also been having left dental pain for several weeks now.

## 2015-03-05 NOTE — Discharge Instructions (Signed)
Penicillin as prescribed.  Prednisone as prescribed.  Continue your albuterol as before as needed for wheezing.   Asthma Asthma is a recurring condition in which the airways tighten and narrow. Asthma can make it difficult to breathe. It can cause coughing, wheezing, and shortness of breath. Asthma episodes, also called asthma attacks, range from minor to life-threatening. Asthma cannot be cured, but medicines and lifestyle changes can help control it. CAUSES Asthma is believed to be caused by inherited (genetic) and environmental factors, but its exact cause is unknown. Asthma may be triggered by allergens, lung infections, or irritants in the air. Asthma triggers are different for each person. Common triggers include:   Animal dander.  Dust mites.  Cockroaches.  Pollen from trees or grass.  Mold.  Smoke.  Air pollutants such as dust, household cleaners, hair sprays, aerosol sprays, paint fumes, strong chemicals, or strong odors.  Cold air, weather changes, and winds (which increase molds and pollens in the air).  Strong emotional expressions such as crying or laughing hard.  Stress.  Certain medicines (such as aspirin) or types of drugs (such as beta-blockers).  Sulfites in foods and drinks. Foods and drinks that may contain sulfites include dried fruit, potato chips, and sparkling grape juice.  Infections or inflammatory conditions such as the flu, a cold, or an inflammation of the nasal membranes (rhinitis).  Gastroesophageal reflux disease (GERD).  Exercise or strenuous activity. SYMPTOMS Symptoms may occur immediately after asthma is triggered or many hours later. Symptoms include:  Wheezing.  Excessive nighttime or early morning coughing.  Frequent or severe coughing with a common cold.  Chest tightness.  Shortness of breath. DIAGNOSIS  The diagnosis of asthma is made by a review of your medical history and a physical exam. Tests may also be performed.  These may include:  Lung function studies. These tests show how much air you breathe in and out.  Allergy tests.  Imaging tests such as X-rays. TREATMENT  Asthma cannot be cured, but it can usually be controlled. Treatment involves identifying and avoiding your asthma triggers. It also involves medicines. There are 2 classes of medicine used for asthma treatment:   Controller medicines. These prevent asthma symptoms from occurring. They are usually taken every day.  Reliever or rescue medicines. These quickly relieve asthma symptoms. They are used as needed and provide short-term relief. Your health care provider will help you create an asthma action plan. An asthma action plan is a written plan for managing and treating your asthma attacks. It includes a list of your asthma triggers and how they may be avoided. It also includes information on when medicines should be taken and when their dosage should be changed. An action plan may also involve the use of a device called a peak flow meter. A peak flow meter measures how well the lungs are working. It helps you monitor your condition. HOME CARE INSTRUCTIONS   Take medicines only as directed by your health care provider. Speak with your health care provider if you have questions about how or when to take the medicines.  Use a peak flow meter as directed by your health care provider. Record and keep track of readings.  Understand and use the action plan to help minimize or stop an asthma attack without needing to seek medical care.  Control your home environment in the following ways to help prevent asthma attacks:  Do not smoke. Avoid being exposed to secondhand smoke.  Change your heating and air conditioning filter  regularly.  Limit your use of fireplaces and wood stoves.  Get rid of pests (such as roaches and mice) and their droppings.  Throw away plants if you see mold on them.  Clean your floors and dust regularly. Use unscented  cleaning products.  Try to have someone else vacuum for you regularly. Stay out of rooms while they are being vacuumed and for a short while afterward. If you vacuum, use a dust mask from a hardware store, a double-layered or microfilter vacuum cleaner bag, or a vacuum cleaner with a HEPA filter.  Replace carpet with wood, tile, or vinyl flooring. Carpet can trap dander and dust.  Use allergy-proof pillows, mattress covers, and box spring covers.  Wash bed sheets and blankets every week in hot water and dry them in a dryer.  Use blankets that are made of polyester or cotton.  Clean bathrooms and kitchens with bleach. If possible, have someone repaint the walls in these rooms with mold-resistant paint. Keep out of the rooms that are being cleaned and painted.  Wash hands frequently. SEEK MEDICAL CARE IF:   You have wheezing, shortness of breath, or a cough even if taking medicine to prevent attacks.  The colored mucus you cough up (sputum) is thicker than usual.  Your sputum changes from clear or white to yellow, green, gray, or bloody.  You have any problems that may be related to the medicines you are taking (such as a rash, itching, swelling, or trouble breathing).  You are using a reliever medicine more than 2-3 times per week.  Your peak flow is still at 50-79% of your personal best after following your action plan for 1 hour.  You have a fever. SEEK IMMEDIATE MEDICAL CARE IF:   You seem to be getting worse and are unresponsive to treatment during an asthma attack.  You are short of breath even at rest.  You get short of breath when doing very little physical activity.  You have difficulty eating, drinking, or talking due to asthma symptoms.  You develop chest pain.  You develop a fast heartbeat.  You have a bluish color to your lips or fingernails.  You are light-headed, dizzy, or faint.  Your peak flow is less than 50% of your personal best. MAKE SURE YOU:    Understand these instructions.  Will watch your condition.  Will get help right away if you are not doing well or get worse. Document Released: 05/20/2005 Document Revised: 10/04/2013 Document Reviewed: 12/17/2012 [facility] Patient Information 2015 [place], [place]. This information is not intended to replace advice given to you by your health care provider. Make sure you discuss any questions you have with your health care provider.

## 2015-04-06 NOTE — Progress Notes (Signed)
  [facility]  Will you be returning to the same living situation after discharge:  No. At discharge, do you have transportation home?: Yes,  bus pass Do you have the ability to pay for your medications: Yes,  MCD  Release of information consent forms completed and in the chart;  Patient's signature needed at discharge.  Patient to Follow up at: Follow-up Information    Follow up with [facility] team.   Why:  Lafonda MossesDiana will give you another appointment time and date when she comes to do your assessment here at the hospital   Contact information:   [address]  [place]  [[phone]      Patient denies SI/HI: Yes,  yes    Safety Planning and Suicide Prevention discussed: Yes,  yes  Have you used any form of tobacco in the last 30 days? (Cigarettes, Smokeless Tobacco, Cigars, and/or Pipes): Yes  Has patient been referred to the Quitline?: Patient refused referral  Ida Rogueorth, Rainie Crenshaw B 04/06/2015, 3:22 PM

## 2016-01-08 ENCOUNTER — Inpatient Hospital Stay (HOSPITAL_COMMUNITY)
Admission: AD | Admit: 2016-01-08 | Discharge: 2016-01-12 | DRG: 885 | Disposition: A | Discharge status: Home / Self Care | Payer: Medicaid Other | Source: Intra-hospital | Attending: Psychiatry | Admitting: Psychiatry

## 2016-01-08 ENCOUNTER — Emergency Department (HOSPITAL_COMMUNITY)
Admission: EM | Admit: 2016-01-08 | Discharge: 2016-01-08 | Disposition: A | Discharge status: Other Acute Inpatient Hospital | Payer: Medicaid Other | Attending: Emergency Medicine | Admitting: Emergency Medicine

## 2016-01-08 ENCOUNTER — Encounter (HOSPITAL_COMMUNITY): Payer: Self-pay | Admitting: *Deleted

## 2016-01-08 ENCOUNTER — Emergency Department (HOSPITAL_COMMUNITY): Payer: Medicaid Other

## 2016-01-08 ENCOUNTER — Encounter (HOSPITAL_COMMUNITY): Payer: Self-pay | Admitting: Emergency Medicine

## 2016-01-08 DIAGNOSIS — R45851 Suicidal ideations: Secondary | ICD-10-CM | POA: Diagnosis present

## 2016-01-08 DIAGNOSIS — J45901 Unspecified asthma with (acute) exacerbation: Secondary | ICD-10-CM

## 2016-01-08 DIAGNOSIS — R440 Auditory hallucinations: Secondary | ICD-10-CM | POA: Diagnosis not present

## 2016-01-08 DIAGNOSIS — R062 Wheezing: Secondary | ICD-10-CM | POA: Diagnosis present

## 2016-01-08 DIAGNOSIS — F250 Schizoaffective disorder, bipolar type: Principal | ICD-10-CM | POA: Diagnosis present

## 2016-01-08 DIAGNOSIS — R443 Hallucinations, unspecified: Secondary | ICD-10-CM

## 2016-01-08 DIAGNOSIS — F259 Schizoaffective disorder, unspecified: Secondary | ICD-10-CM | POA: Diagnosis present

## 2016-01-08 DIAGNOSIS — F1311 Sedative, hypnotic or anxiolytic abuse, in remission: Secondary | ICD-10-CM | POA: Diagnosis present

## 2016-01-08 DIAGNOSIS — Z79899 Other long term (current) drug therapy: Secondary | ICD-10-CM | POA: Insufficient documentation

## 2016-01-08 DIAGNOSIS — R45850 Homicidal ideations: Secondary | ICD-10-CM | POA: Insufficient documentation

## 2016-01-08 DIAGNOSIS — F17210 Nicotine dependence, cigarettes, uncomplicated: Secondary | ICD-10-CM | POA: Diagnosis present

## 2016-01-08 DIAGNOSIS — F1220 Cannabis dependence, uncomplicated: Secondary | ICD-10-CM | POA: Diagnosis present

## 2016-01-08 LAB — RAPID URINE DRUG SCREEN, HOSP PERFORMED
AMPHETAMINES: NOT DETECTED
BENZODIAZEPINES: NOT DETECTED
Barbiturates: NOT DETECTED
Cocaine: NOT DETECTED
OPIATES: NOT DETECTED
TETRAHYDROCANNABINOL: POSITIVE — AB

## 2016-01-08 LAB — CBC
HCT: 39.4 % (ref 39.0–52.0)
HEMOGLOBIN: 13.3 g/dL (ref 13.0–17.0)
MCH: 27.7 pg (ref 26.0–34.0)
MCHC: 33.8 g/dL (ref 30.0–36.0)
MCV: 81.9 fL (ref 78.0–100.0)
PLATELETS: 238 10*3/uL (ref 150–400)
RBC: 4.81 MIL/uL (ref 4.22–5.81)
RDW: 14.5 % (ref 11.5–15.5)
WBC: 9.7 10*3/uL (ref 4.0–10.5)

## 2016-01-08 LAB — COMPREHENSIVE METABOLIC PANEL
ALBUMIN: 4.2 g/dL (ref 3.5–5.0)
ALT: 20 U/L (ref 17–63)
AST: 24 U/L (ref 15–41)
Alkaline Phosphatase: 98 U/L (ref 38–126)
Anion gap: 10 (ref 5–15)
BUN: 22 mg/dL — AB (ref 6–20)
CHLORIDE: 102 mmol/L (ref 101–111)
CO2: 26 mmol/L (ref 22–32)
CREATININE: 1.1 mg/dL (ref 0.61–1.24)
Calcium: 9.4 mg/dL (ref 8.9–10.3)
GFR calc Af Amer: >60 mL/min (ref >60)
GFR calc non Af Amer: >60 mL/min (ref >60)
Glucose, Bld: 84 mg/dL (ref 65–99)
POTASSIUM: 3.5 mmol/L (ref 3.5–5.1)
SODIUM: 138 mmol/L (ref 135–145)
Total Bilirubin: 0.4 mg/dL (ref 0.3–1.2)
Total Protein: 7.2 g/dL (ref 6.5–8.1)

## 2016-01-08 LAB — SALICYLATE LEVEL: Salicylate Lvl: <4 mg/dL (ref 2.8–30.0)

## 2016-01-08 LAB — ACETAMINOPHEN LEVEL: Acetaminophen (Tylenol), Serum: <10 ug/mL — ABNORMAL LOW (ref 10–30)

## 2016-01-08 LAB — ETHANOL

## 2016-01-08 MED ORDER — OLANZAPINE 5 MG PO TBDP
5.0000 mg | ORAL_TABLET | Freq: Three times a day (TID) | ORAL | Status: DC | PRN
  Administered 2016-01-10 – 2016-01-11 (×3): 5 mg via ORAL
  Filled 2016-01-08 (×4): qty 1

## 2016-01-08 MED ORDER — ALBUTEROL SULFATE (2.5 MG/3ML) 0.083% IN NEBU
2.5000 mg | INHALATION_SOLUTION | Freq: Once | RESPIRATORY_TRACT | Status: AC
  Administered 2016-01-08: 2.5 mg via RESPIRATORY_TRACT
  Filled 2016-01-08: qty 3

## 2016-01-08 MED ORDER — ALBUTEROL SULFATE HFA 108 (90 BASE) MCG/ACT IN AERS: 1.0000 | INHALATION_SPRAY | RESPIRATORY_TRACT | Status: DC | PRN

## 2016-01-08 MED ORDER — GABAPENTIN 300 MG PO CAPS
300.0000 mg | ORAL_CAPSULE | Freq: Two times a day (BID) | ORAL | Status: DC
  Administered 2016-01-09 – 2016-01-12 (×7): 300 mg via ORAL
  Filled 2016-01-08 (×9): qty 1

## 2016-01-08 MED ORDER — PREDNISONE 20 MG PO TABS
60.0000 mg | ORAL_TABLET | Freq: Once | ORAL | Status: AC
  Administered 2016-01-08: 60 mg via ORAL
  Filled 2016-01-08: qty 3

## 2016-01-08 MED ORDER — PREDNISONE 20 MG PO TABS: 60.0000 mg | ORAL_TABLET | Freq: Every day | ORAL | Status: DC

## 2016-01-08 MED ORDER — PALIPERIDONE ER 6 MG PO TB24
6.0000 mg | ORAL_TABLET | Freq: Every day | ORAL | Status: DC
  Administered 2016-01-09 – 2016-01-12 (×4): 6 mg via ORAL
  Filled 2016-01-08 (×5): qty 1

## 2016-01-08 MED ORDER — ZIPRASIDONE MESYLATE 20 MG IM SOLR: 20.0000 mg | INTRAMUSCULAR | Status: DC | PRN

## 2016-01-08 MED ORDER — ALBUTEROL SULFATE HFA 108 (90 BASE) MCG/ACT IN AERS: 2.0000 | INHALATION_SPRAY | RESPIRATORY_TRACT | Status: DC | PRN

## 2016-01-08 MED ORDER — LORAZEPAM 1 MG PO TABS
1.0000 mg | ORAL_TABLET | Freq: Once | ORAL | Status: AC
  Administered 2016-01-08: 1 mg via ORAL
  Filled 2016-01-08: qty 1

## 2016-01-08 MED ORDER — IBUPROFEN 600 MG PO TABS: 600.0000 mg | ORAL_TABLET | Freq: Four times a day (QID) | ORAL | Status: DC | PRN

## 2016-01-08 MED ORDER — TRAZODONE HCL 50 MG PO TABS
50.0000 mg | ORAL_TABLET | Freq: Every evening | ORAL | Status: DC | PRN
  Administered 2016-01-10 – 2016-01-11 (×2): 50 mg via ORAL
  Filled 2016-01-08 (×9): qty 1

## 2016-01-08 MED ORDER — BENZTROPINE MESYLATE 1 MG PO TABS
1.0000 mg | ORAL_TABLET | Freq: Every day | ORAL | Status: DC
  Administered 2016-01-09 – 2016-01-12 (×4): 1 mg via ORAL
  Filled 2016-01-08 (×5): qty 1

## 2016-01-08 MED ORDER — IPRATROPIUM-ALBUTEROL 0.5-2.5 (3) MG/3ML IN SOLN
3.0000 mL | Freq: Once | RESPIRATORY_TRACT | Status: AC
  Administered 2016-01-08: 3 mL via RESPIRATORY_TRACT
  Filled 2016-01-08: qty 3

## 2016-01-08 MED ORDER — LORAZEPAM 1 MG PO TABS
1.0000 mg | ORAL_TABLET | ORAL | Status: AC | PRN
  Administered 2016-01-10: 1 mg via ORAL
  Filled 2016-01-08: qty 1

## 2016-01-08 MED ORDER — MAGNESIUM HYDROXIDE 400 MG/5ML PO SUSP: 30.0000 mL | Freq: Every day | ORAL | Status: DC | PRN

## 2016-01-08 MED ORDER — ACETAMINOPHEN 325 MG PO TABS: 650.0000 mg | ORAL_TABLET | Freq: Four times a day (QID) | ORAL | Status: DC | PRN

## 2016-01-08 MED ORDER — LAMOTRIGINE 25 MG PO TABS
50.0000 mg | ORAL_TABLET | Freq: Every day | ORAL | Status: DC
  Administered 2016-01-09 – 2016-01-12 (×4): 50 mg via ORAL
  Filled 2016-01-08 (×5): qty 2

## 2016-01-08 MED ORDER — ALUM & MAG HYDROXIDE-SIMETH 200-200-20 MG/5ML PO SUSP: 30.0000 mL | ORAL | Status: DC | PRN

## 2016-01-08 NOTE — ED Notes (Signed)
Gave patient a sprite. 

## 2016-01-08 NOTE — ED Triage Notes (Signed)
Pt c/o cough and wheezing x 3 weeks. Pt also reports having mental health issues of hearing voices and having suicidal and homicidal thoughts. Pt reports medication that is prescribed for him is not working.

## 2016-01-08 NOTE — Progress Notes (Signed)
Report received from admitting RN.  Introduced self to pt.  Pt denies HI, denies visual hallucinations, denies pain.  He reports passive SI.  Pt verbally contracts for safety.  Pt reports AH of voices saying "negative things."  Educated pt regarding PRN medications.  Pt reports he will inform staff of needs and concerns.  Will continue to monitor and assess.

## 2016-01-08 NOTE — Tx Team (Signed)
Initial Interdisciplinary Treatment Plan   PATIENT STRESSORS: Medication change or noncompliance Substance abuse Psychosis   PATIENT STRENGTHS: Capable of independent living Psychologist, counsellingCommunication skills Financial means Physical Health Supportive family/friends   PROBLEM LIST: Problem List/Patient Goals Date to be addressed Date deferred Reason deferred Estimated date of resolution  At risk for suicide 01/08/2016  01/08/2016   D/C  Psychosis 01/08/2016  01/08/2016   D/C  "Work on getting back on my medicine" 01/08/2016  01/08/2016   D/C  "Talk to someone when I'm feeling suicidal" 01/08/2016  01/08/2016   D/C                                 DISCHARGE CRITERIA:  Ability to meet basic life and health needs Improved stabilization in mood, thinking, and/or behavior Motivation to continue treatment in a less acute level of care Need for constant or close observation no longer present Reduction of life-threatening or endangering symptoms to within safe limits Verbal commitment to aftercare and medication compliance  PRELIMINARY DISCHARGE PLAN: Outpatient therapy Return to previous living arrangement  PATIENT/FAMIILY INVOLVEMENT: This treatment plan has been presented to and reviewed with the patient, Ronald Martinez.  The patient and family have been given the opportunity to ask questions and make suggestions.  Ronald Martinez, Ronald Martinez 01/08/2016, 10:55 PM

## 2016-01-08 NOTE — ED Notes (Signed)
Placed patient into scrubs

## 2016-01-08 NOTE — ED Notes (Signed)
Patient transported to X-ray with sitter 
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
## 2016-01-08 NOTE — ED Notes (Signed)
Pt went to X-ray.   

## 2016-01-08 NOTE — ED Notes (Signed)
Ordered House tray for pt

## 2016-01-08 NOTE — ED Provider Notes (Signed)
[facility]-EMERGENCY DEPT Provider Note   CSN: [id number] Arrival date & time: 01/08/16  1002  First Provider Contact:  First MD Initiated Contact with Patient 01/08/16 1050        History   Chief Complaint Chief Complaint  Patient presents with  . Wheezing  . Cough  . Homicidal  . Hallucinations    HPI Ronald Martinez is a 35 y.o. male with history of bipolar 1 disorder, schizophrenia, depression, asthma who presents with a two-week history of cough and wheezing and a 3 week history of suicidal and homicidal ideations and audio hallucinations. Patient reports he stopped taking his medications 3 weeks ago because they were not working. He then felt his mental health issues became worse following cessation of his medications. Patient reports that he has been having suicidal ideations and does report having a plan that he was thinking about a day. Patient states he was going to "slash my wrists and bleed to death." When questioned about his homicidal ideations, patient states that he has one specific person that he used to be friends with and had an argument with and that he wants to "slash them." Patient states he would use a knife for the above plans. Patient does not own a firearm. Patient seen by [facility] for his medications and psychiatry services. Patient does not have a primary care provider. Patient states that he goes to a community group and is picked up by [facility] once per week. Patient mostly came in for his mental health issues today, however he has been having a productive cough with clear sputum and constant wheezing for the past 2 weeks. Patient does not have any asthma medications at home because he does not have a primary care provider. Patient has not taken anything over-the-counter for his symptoms. Patient denies any fevers, chest pain or tightness, shortness of breath, abdominal pain, nausea, vomiting, dysuria.  HPI  Past Medical History:  Diagnosis Date  . Asthma   . Bipolar 1 disorder  (HCC)   . Depression   . Schizophrenia [facility](HCC)     Patient Active Problem List   Diagnosis Date Noted  . Severe benzodiazepine use disorder 01/25/2015  . Cannabis use disorder, severe, dependence (HCC) 08/17/2014  . Schizoaffective disorder, bipolar type (HCC) 08/16/2014    History reviewed. No pertinent surgical history.     Home Medications    Prior to Admission medications   Medication Sig Start Date End Date Taking? Authorizing Provider  benztropine (COGENTIN) 1 MG tablet Take 1 tablet (1 mg total) by mouth daily. Patient taking differently: Take 1 mg by mouth 2 (two) times daily.  01/27/15  Yes Thermon LeylandLaura A Davis, NP  gabapentin (NEURONTIN) 300 MG capsule Take 300 mg by mouth 2 (two) times daily.   Yes Historical Provider, MD  lamoTRIgine (LAMICTAL) 25 MG tablet Take 1 tablet (25 mg total) by mouth daily. Patient taking differently: Take 50 mg by mouth daily.  01/27/15  Yes Thermon LeylandLaura A Davis, NP  paliperidone (INVEGA SUSTENNA) 234 MG/1.5ML SUSP injection Inject 234 mg into the muscle every 30 (thirty) days.   Yes Historical Provider, MD  traZODone (DESYREL) 50 MG tablet Take 1 tablet (50 mg total) by mouth at bedtime and may repeat dose one time if needed. Patient taking differently: Take 50 mg by mouth at bedtime.  01/27/15  Yes Thermon LeylandLaura A Davis, NP  albuterol (PROVENTIL HFA;VENTOLIN HFA) 108 (90 BASE) MCG/ACT inhaler Inhale 2 puffs into the lungs every 4 (four) hours as needed for wheezing  or shortness of breath. 01/05/15   Pricilla Loveless, MD  ibuprofen (ADVIL,MOTRIN) 600 MG tablet Take 1 tablet (600 mg total) by mouth every 8 (eight) hours as needed. Patient not taking: Reported on 01/08/2016 02/26/15   Azalia Bilis, MD  naproxen (NAPROSYN) 250 MG tablet Take 1 tablet (250 mg total) by mouth 2 (two) times daily with a meal. Patient not taking: Reported on 01/08/2016 01/30/15   Everlene Farrier, PA-C  nicotine (NICODERM CQ - DOSED IN MG/24 HOURS) 21 mg/24hr patch Place 1 patch (21 mg total) onto the  skin daily. Patient not taking: Reported on 03/05/2015 01/27/15   Thermon Leyland, NP  paliperidone (INVEGA) 6 MG 24 hr tablet Take 1 tablet (6 mg total) by mouth daily. Patient not taking: Reported on 01/08/2016 01/27/15   Thermon Leyland, NP  penicillin v potassium (VEETID) 500 MG tablet Take 1 tablet (500 mg total) by mouth 3 (three) times daily. Patient not taking: Reported on 01/08/2016 03/05/15   Geoffery Lyons, MD  predniSONE (DELTASONE) 10 MG tablet Take 2 tablets (20 mg total) by mouth 2 (two) times daily. Patient not taking: Reported on 01/08/2016 03/05/15   Geoffery Lyons, MD    Family History Family History  Problem Relation Age of Onset  . Asthma Mother   . Alcoholism Other     Social History Social History  Substance Use Topics  . Smoking status: Current Some Day Smoker    Packs/day: 1.00    Types: Cigarettes  . Smokeless tobacco: Never Used  . Alcohol use Yes     Allergies   Salami [pickled meat]   Review of Systems Review of Systems  Constitutional: Negative for chills and fever.  HENT: Negative for facial swelling.   Respiratory: Positive for cough and wheezing. Negative for chest tightness and shortness of breath.   Cardiovascular: Negative for chest pain.  Gastrointestinal: Negative for abdominal pain, nausea and vomiting.  Genitourinary: Negative for dysuria.  Musculoskeletal: Negative for back pain.  Skin: Negative for rash and wound.  Neurological: Negative for headaches.  Psychiatric/Behavioral: The patient is not nervous/anxious.      Physical Exam Updated Vital Signs BP 134/68 (BP Location: Right Arm)   Pulse 72   Temp 98.1 F (36.7 C) (Oral)   Resp 20   Ht 5\' 11"  (1.803 m)   Wt 103.7 kg   SpO2 100%   BMI 31.88 kg/m   Physical Exam  Constitutional: He appears well-developed and well-nourished. No distress.  HENT:  Head: Normocephalic and atraumatic.  Mouth/Throat: Oropharynx is clear and moist. No oropharyngeal exudate.  Eyes: Conjunctivae and  EOM are normal. Pupils are equal, round, and reactive to light. Right eye exhibits no discharge. Left eye exhibits no discharge. No scleral icterus.  Neck: Normal range of motion. Neck supple. No thyromegaly present.  Cardiovascular: Normal rate, regular rhythm, normal heart sounds and intact distal pulses.  Exam reveals no gallop and no friction rub.   No murmur heard. Pulmonary/Chest: Effort normal. No stridor. No respiratory distress. He has wheezes (diffuse expiratory). He has no rales.  Abdominal: Soft. Bowel sounds are normal. He exhibits no distension. There is no tenderness. There is no rebound and no guarding.  Musculoskeletal: He exhibits no edema.  Lymphadenopathy:    He has no cervical adenopathy.  Neurological: He is alert. Coordination normal.  Skin: Skin is warm and dry. No rash noted. He is not diaphoretic. No pallor.  Psychiatric: He has a normal mood and affect. His speech is normal  and behavior is normal. He is actively hallucinating (audio, no visual). He expresses homicidal and suicidal ideation. He expresses suicidal plans and homicidal plans.  Nursing note and vitals reviewed.    ED Treatments / Results  Labs (all labs ordered are listed, but only abnormal results are displayed) Labs Reviewed  COMPREHENSIVE METABOLIC PANEL - Abnormal; Notable for the following:       Result Value   BUN 22 (*)    All other components within normal limits  ACETAMINOPHEN LEVEL - Abnormal; Notable for the following:    Acetaminophen (Tylenol), Serum <10 (*)    All other components within normal limits  URINE RAPID DRUG SCREEN, HOSP PERFORMED - Abnormal; Notable for the following:    Tetrahydrocannabinol POSITIVE (*)    All other components within normal limits  ETHANOL  SALICYLATE LEVEL  CBC    EKG  EKG Interpretation None       Radiology Dg Chest 2 View  Result Date: 01/08/2016 CLINICAL DATA:  35 year old male with cough and wheezing for 3 weeks. Auditory hallucinations  and suicidal ideation. Initial encounter. EXAM: CHEST  2 VIEW COMPARISON:  03/05/2015 and earlier. FINDINGS: Stable lung volumes. Normal cardiac size and mediastinal contours. Visualized tracheal air column is within normal limits. Stable mild increased interstitial markings. No pneumothorax, pulmonary edema, pleural effusion or acute pulmonary opacity. Dextro convex thoracic scoliosis. No acute osseous abnormality identified. IMPRESSION: No acute cardiopulmonary abnormality. Electronically Signed   By: Odessa Fleming M.D.   On: 01/08/2016 12:14    Procedures Procedures (including critical care time)  Medications Ordered in ED Medications  predniSONE (DELTASONE) tablet 60 mg (not administered)  predniSONE (DELTASONE) tablet 60 mg (not administered)  albuterol (PROVENTIL HFA;VENTOLIN HFA) 108 (90 Base) MCG/ACT inhaler 1-2 puff (not administered)  albuterol (PROVENTIL) (2.5 MG/3ML) 0.083% nebulizer solution 2.5 mg (2.5 mg Nebulization Given 01/08/16 1205)  ipratropium-albuterol (DUONEB) 0.5-2.5 (3) MG/3ML nebulizer solution 3 mL (3 mLs Nebulization Given 01/08/16 1309)  LORazepam (ATIVAN) tablet 1 mg (1 mg Oral Given 01/08/16 1309)     Initial Impression / Assessment and Plan / ED Course  I have reviewed the triage vital signs and the nursing notes.  Pertinent labs & imaging results that were available during my care of the patient were reviewed by me and considered in my medical decision making (see chart for details).  Clinical Course    Albuterol nebulizer ordered. After completion, patient states he feels a little better, but is on lung exam patient still wheezing. I will order a DuoNeb.  On repeat lung exam after DuoNeb, wheezing completely resolved and patient states he is breathing better. I will treat patient with 5 days burst of prednisone and albuterol inhaler.  Final Clinical Impressions(s) / ED Diagnoses   Final diagnoses:  Asthma exacerbation  Hallucinations  Homicidal ideation    Suicidal ideations   Patient medically cleared. Patient be treated with a 5 day prednisone burst and albuterol inhaler q4hours PRN shortness of breath or wheezing.  CBC unremarkable. CMP shows BUN 22. CXR shows no acute cardio pulmonary abnormality. Negative acetaminophen, salicylate, ethanol levels. UDS shows positive THC. Patient will be admitted to inpatient psychiatric treatment per TTS consult. Placement pending. Patient understands and agrees with plan. I discussed patient case with Dr. Fayrene Fearing who is in agreement with plan. Patient vitals stable throughout ED course.  New Prescriptions New Prescriptions   No medications on file     Verdis Prime 01/08/16 1441    Rolland Porter, MD  01/16/16 1106  

## 2016-01-08 NOTE — ED Notes (Signed)
[facility] Accepting provider: Elna BreslowEappen, MD.   Voluntary admission and consent for treatment [facility]

## 2016-01-08 NOTE — ED Notes (Signed)
Patient ambulatory with steady gait, A&O x 4. [facility] [employer].

## 2016-01-08 NOTE — ED Notes (Signed)
Dinner tray ordered, no sharps

## 2016-01-08 NOTE — ED Notes (Signed)
Valuables locked up with security in box 5, form signed by pt and placed in [place] in pod C- Knife, cell phone, keys, $53.84 Clothes nad shoes placed in bin.

## 2016-01-08 NOTE — Progress Notes (Signed)
[facility] to Dr. Elna BreslowEappen, 504 bed 1. Please have patient arrive at 19:30. RN call report at [phone]. [facility] RN Tobi Bastosnna has been informed.  Ronald Martinez, LCSWA Disposition staff 01/08/2016 4:07 PM

## 2016-01-08 NOTE — ED Notes (Signed)
[employer] notified pt is ready for transfer.

## 2016-01-08 NOTE — ED Notes (Signed)
Attempted report x 2 

## 2016-01-09 ENCOUNTER — Encounter (HOSPITAL_COMMUNITY): Payer: Self-pay | Admitting: Psychiatry

## 2016-01-09 DIAGNOSIS — F250 Schizoaffective disorder, bipolar type: Principal | ICD-10-CM

## 2016-01-09 DIAGNOSIS — R45851 Suicidal ideations: Secondary | ICD-10-CM

## 2016-01-09 DIAGNOSIS — F1311 Sedative, hypnotic or anxiolytic abuse, in remission: Secondary | ICD-10-CM | POA: Diagnosis present

## 2016-01-09 DIAGNOSIS — R45850 Homicidal ideations: Secondary | ICD-10-CM

## 2016-01-09 LAB — LIPID PANEL
CHOLESTEROL: 204 mg/dL — AB (ref 0–200)
HDL: 79 mg/dL (ref >40)
LDL CALC: 114 mg/dL — AB (ref 0–99)
TRIGLYCERIDES: 57 mg/dL (ref <150)
Total CHOL/HDL Ratio: 2.6 RATIO
VLDL: 11 mg/dL (ref 0–40)

## 2016-01-09 LAB — TSH: TSH: 0.563 u[IU]/mL (ref 0.350–4.500)

## 2016-01-09 MED ORDER — NICOTINE 21 MG/24HR TD PT24
21.0000 mg | MEDICATED_PATCH | Freq: Every day | TRANSDERMAL | Status: DC
  Filled 2016-01-09 (×5): qty 1

## 2016-01-09 NOTE — [facility] Group Notes (Signed)
Patient did not attend group.

## 2016-01-09 NOTE — Progress Notes (Signed)
Recreation Therapy Notes  Date: 01/09/16 Time: 1000 Location: 500 Hall Dayroom  Group Topic: Communication, Team Building, Problem Solving  Goal Area(s) Addresses:  Patient will effectively work with peer towards shared goal.  Patient will identify skill used to make activity successful.  Patient will identify how skills used during activity can be used to reach post d/c goals.   Intervention: Beach ball  Activity: Keep It Going Volleyball.  Patients will sit in a circle and pass the ball to various members of the group around the circle without letting the ball hit the ground.  Education: Social Skills, Discharge Planning.    Clinical Observations/Feedback: Pt did not attend group.     Alianna Wurster, LRT/CTRS  
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
## 2016-01-09 NOTE — Progress Notes (Signed)
DAR NOTE: Patient presents with depressed mood and affect. Denies pain, auditory and visual hallucinations.  Reports suicidal thoughts on self inventory form but contracts for safety.  Described energy level as normal and concentration as good.  Rates depression at 6, hopelessness at 4, and anxiety at 8.  Maintained on routine safety checks.  Medications given as prescribed.  Support and encouragement offered as needed.  Attended group and participated.  States goal for today is "to get back on the proper medication."  Patient observed socializing with peers in the dayroom.  Offered no complaint.
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
## 2016-01-09 NOTE — Tx Team (Signed)
Interdisciplinary Treatment Plan Update (Adult)  Date:  01/09/2016  Time Reviewed:  9:04 AM   Progress in Treatment: Attending groups: No. New to unit. Continuing to assess.  Participating in groups:  No. Taking medication as prescribed:  Yes. Tolerating medication:  Yes. Family/Significant othe contact made:  SPE required for this pt.  Patient understands diagnosis:  Yes. and As evidenced by:  seeking treatment for AH, passive SI, HI (passive) toward an ex friend, mood lability, and for medication stabilization. Discussing patient identified problems/goals with staff:  Yes. Medical problems stabilized or resolved:  Yes. Denies suicidal/homicidal ideation: Yes. Issues/concerns per patient self-inventory:  Other:  New problem(s) identified:  Pt has limited insight at this time and does not recognize correlation between stopping his mental health medications and onset of symptoms.   Discharge Plan or Barriers: Pt is current with [facility] ACT team in [place]. Pt last admitted to [facility]: 01/24/15, 08/16/14, 07/15/14, for similar issues.   Reason for Continuation of Hospitalization: Depression Hallucinations Homicidal ideation Medication stabilization Suicidal ideation  Comments:  Ronald Martinez is a 35 y.o. male who presents to [facility] due to [place], [place], and command [place]. Pt reports that he has an ACCT team with [facility] [phone]), who delivers meds to him home once a week. Pt reports that he d/c his meds 3 weeks ago "b/c it wasn't working. I ain't see no change". Pt indicates that he thinks he told his ACCT team that he stopped taking his meds, but he wasn't for sure. Pt reports that he started having SI w/ plans to "slash my wrist" about 2 weeks ago. Pt also indicates that, at the same time, he started having HI towards an ex-friend who's "been getting on my nerves...keeps pushing buttons". Pt discloses that he's been having thoughts to "slash him". Pt also reports having AH telling him negative things,  such as "you're not worth anything...get rid of yourself" and "get rid of him". Pt also reports decreased sleep. During initial assessment, the writer called pt's ACCT team to report pt's presentation to the ED and the reason. Writer was advised that pt did not call the crisis phone nor make them aware of his issues. Writer was also advised that this is not a normal occurrence for pt and he is not at baseline. Diagnosis: Schizoaffective d/o, bipolar type  Estimated length of stay:  3-5 days   New goal(s): to develop effective aftercare plan.   Additional Comments:  Patient and CSW reviewed pt's identified goals and treatment plan. Patient verbalized understanding and agreed to treatment plan. CSW reviewed [facility] "Discharge Process and Patient Involvement" Form. Pt verbalized understanding of information provided and signed form.    Review of initial/current patient goals per problem list:  1. Goal(s): Patient will participate in aftercare plan  Met: Yes  Target date: at discharge  As evidenced by: Patient will participate within aftercare plan AEB aftercare provider and housing plan at discharge being identified.  8/8: Pt plans to return home; resume services with [facility] upon discharge from the hospital.   2. Goal (s): Patient will exhibit decreased depressive symptoms and suicidal ideations.  Met: No.    Target date: at discharge  As evidenced by: Patient will utilize self rating of depression at 3 or below and demonstrate decreased signs of depression or be deemed stable for discharge by MD.  8/8: Pt rates depression as high but denies SI/HI at this time.   3. Goal(s): Patient will demonstrate decreased signs and symptoms of psychosis.  Met:  Target date: at discharge  As evidenced by: Patient will report no AVH upon discharge and will demonstrate clear; coherent thought process.   8/8: Pt continues to reports AH-negative in nature, some paranoia, and mood lability;  poor insight.   Attendees: Patient:   01/09/2016 9:04 AM   Family:   01/09/2016 9:04 AM   Physician:  Dr. Shea Evans MD; Dr. Parke Poisson MD 01/09/2016 9:04 AM   Nursing:   Nicoletta Dress RN; Detar North RN 01/09/2016 9:04 AM   Clinical Social Worker: Maxie Better, LCSW 01/09/2016 9:04 AM   Clinical Social Worker: Roque Lias LCSW 01/09/2016 9:04 AM   Other:   01/09/2016 9:04 AM   Other:  01/09/2016 9:04 AM   Other:   01/09/2016 9:04 AM   Other:  01/09/2016 9:04 AM   Other:  01/09/2016 9:04 AM   Other:  01/09/2016 9:04 AM    01/09/2016 9:04 AM    01/09/2016 9:04 AM    01/09/2016 9:04 AM    01/09/2016 9:04 AM    Scribe for Treatment Team:   Maxie Better, LCSW 01/09/2016 9:04 AM
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
## 2016-01-09 NOTE — [facility] Group Notes (Signed)
[facility] LCSW Group Therapy  01/09/2016 3:24 PM   Type of Therapy:  Group Therapy  Participation Level:  Active  Participation Quality:  Attentive  Affect:  Appropriate  Cognitive:  Appropriate  Insight:  Improving  Engagement in Therapy:  Engaged  Modes of Intervention:  Clarification, Education, Exploration and Socialization  Summary of Progress/Problems: Today's group focused on relapse prevention.  We defined the term, and then brainstormed on ways to prevent relapse. Made a brief appearance-no contribution.  Daryel Geraldorth, Anothy Bufano B 01/09/2016 , 3:24 PM

## 2016-01-10 LAB — LIPID PANEL
Cholesterol: 180 mg/dL (ref 0–200)
HDL: 69 mg/dL (ref >40)
LDL CALC: 102 mg/dL — AB (ref 0–99)
TRIGLYCERIDES: 47 mg/dL (ref <150)
Total CHOL/HDL Ratio: 2.6 RATIO
VLDL: 9 mg/dL (ref 0–40)

## 2016-01-10 LAB — PROLACTIN: PROLACTIN: 15.7 ng/mL — AB (ref 4.0–15.2)

## 2016-01-10 LAB — TSH: TSH: 2.491 u[IU]/mL (ref 0.350–4.500)

## 2016-01-10 LAB — HEMOGLOBIN A1C
Hgb A1c MFr Bld: 5.7 % — ABNORMAL HIGH (ref 4.8–5.6)
Mean Plasma Glucose: 117 mg/dL

## 2016-01-10 NOTE — Progress Notes (Signed)
Recreation Therapy Notes  01/10/16 1041:  LRT went to meet with pt but pt was asleep.   Caroll RancherMarjette Fran Mcree, LRT/CTRS     Caroll RancherLindsay, Naveah Brave A 01/10/2016 11:28 AM

## 2016-01-10 NOTE — [facility] Group Notes (Signed)
[facility]  01/10/2016 , 1:43 PM    Type of Therapy:  [facility] [employer] Presentation  Participation Level:  Active  Participation Quality:  Attentive  Affect:  Blunted  Cognitive:  Oriented  Insight:  Limited  Engagement in Therapy:  Engaged  Modes of Intervention:  Discussion, Education and Socialization  Summary of Progress/Problems:  Onalee HuaDavid from [employer] came to present his recovery story and play the guitar.  Invited.  Chose to not attend.  Wandered in about half way through.  Stayed the rest of the time.  Daryel Geraldorth, Radonna Bracher B 01/10/2016 , 1:43 PM

## 2016-01-10 NOTE — Progress Notes (Signed)
[facility] MD Progress Note  01/10/2016 11:40 AM Ronald Martinez  MRN:  [id number] Subjective:   Patient seen, chart reviewed and case discussed with nursing staff.  No behavioral issues reported.   Patient is a poor historian and does not elaborate the story.  Patient states that he feels "better." He reports vague SI as "not as bad" but denies any plans/intent. He denies HI, AH/VH. He feels "less" irritated compared to before.   Principal Problem: Schizoaffective disorder, bipolar type (HCC) Diagnosis:   Patient Active Problem List   Diagnosis Date Noted  . 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 Cannabis use disorder, severe, dependence (HCC) [F12.20] 08/17/2014  . Schizoaffective disorder, bipolar type (HCC) [F25.0] 08/16/2014   Total Time spent with patient: 15 minutes  Past Psychiatric History: see HPI  Past Medical History:  Past Medical History:  Diagnosis Date  . Asthma   . Bipolar 1 disorder (HCC)   . Depression   . Schizophrenia (HCC)    History reviewed. No pertinent surgical history. Family History:  Family History  Problem Relation Age of Onset  . Asthma Mother   . Alcoholism Other    Family Psychiatric  History: see HPI Social History:  History  Alcohol Use  . Yes     History  Drug Use  . Types: Marijuana, Benzodiazepines    Social History   Social History  . Marital status: Single    Spouse name: N/A  . Number of children: N/A  . Years of education: N/A   Social History Main Topics  . Smoking status: Current Some Day Smoker    Packs/day: 1.00    Types: Cigarettes  . Smokeless tobacco: Never Used  . Alcohol use Yes  . Drug use:     Types: Marijuana, Benzodiazepines  . Sexual activity: No   Other Topics Concern  . None   Social History Narrative  . None   Additional Social History:     no update                    Sleep: Poor  Appetite:  Good  Current Medications: Current Facility-Administered Medications   Medication Dose Route Frequency Provider Last Rate Last Dose  . acetaminophen (TYLENOL) tablet 650 mg  650 mg Oral Q6H PRN Kerry Hough, PA-C      . albuterol (PROVENTIL HFA;VENTOLIN HFA) 108 (90 Base) MCG/ACT inhaler 2 puff  2 puff Inhalation Q4H PRN Kerry Hough, PA-C      . alum & mag hydroxide-simeth (MAALOX/MYLANTA) 200-200-20 MG/5ML suspension 30 mL  30 mL Oral Q4H PRN Oneta Rack, NP      . benztropine (COGENTIN) tablet 1 mg  1 mg Oral Daily Kerry Hough, PA-C   1 mg at 01/10/16 0825  . gabapentin (NEURONTIN) capsule 300 mg  300 mg Oral BID Kerry Hough, PA-C   300 mg at 01/10/16 4098  . ibuprofen (ADVIL,MOTRIN) tablet 600 mg  600 mg Oral Q6H PRN Kerry Hough, PA-C      . lamoTRIgine (LAMICTAL) tablet 50 mg  50 mg Oral Daily Kerry Hough, PA-C   50 mg at 01/10/16 0826  . OLANZapine zydis (ZYPREXA) disintegrating tablet 5 mg  5 mg Oral Q8H PRN Kerry Hough, PA-C   5 mg at 01/10/16 0326   And  . LORazepam (ATIVAN) tablet 1 mg  1 mg Oral PRN Kerry Hough, PA-C       And  . ziprasidone (  GEODON) injection 20 mg  20 mg Intramuscular PRN Kerry HoughSpencer E Simon, PA-C      . magnesium hydroxide (MILK OF MAGNESIA) suspension 30 mL  30 mL Oral Daily PRN Oneta Rackanika N Lewis, NP      . nicotine (NICODERM CQ - dosed in mg/24 hours) patch 21 mg  21 mg Transdermal Daily Saramma Eappen, MD      . paliperidone (INVEGA) 24 hr tablet 6 mg  6 mg Oral Daily Kerry HoughSpencer E Simon, PA-C   6 mg at 01/10/16 0825  . traZODone (DESYREL) tablet 50 mg  50 mg Oral QHS,MR X 1 Kerry HoughSpencer E Simon, PA-C        Lab Results:  Results for orders placed or performed during the hospital encounter of 01/08/16 (from the past 48 hour(s))  Hemoglobin A1c     Status: Abnormal   Collection Time: 01/09/16  6:35 AM  Result Value Ref Range   Hgb A1c MFr Bld 5.7 (H) 4.8 - 5.6 %    Comment: (NOTE)         Pre-diabetes: 5.7 - 6.4         Diabetes: >6.4         Glycemic control for adults with diabetes: <7.0    Mean Plasma  Glucose 117 mg/dL    Comment: (NOTE) Performed At: [facility] [address] [place], [place] [id number] Mila HomerHancock William F MD [id number]:[phone] [facility]formed [facility]   Lipid panel, fasting     Status: Abnormal   Collection Time: 01/09/16  6:35 AM  Result Value Ref Range   Cholesterol 204 (H) 0 - 200 mg/dL   Triglycerides 57 <308<150 mg/dL   HDL 79 >65>40 mg/dL   Total CHOL/HDL Ratio 2.6 RATIO   VLDL 11 0 - 40 mg/dL   LDL Cholesterol 784114 (H) 0 - 99 mg/dL    Comment:        Total Cholesterol/HDL:CHD Risk Coronary Heart Disease Risk Table                     Men   Women  1/2 Average Risk   3.4   3.3  Average Risk       5.0   4.4  2 X Average Risk   9.6   7.1  3 X Average Risk  23.4   11.0        Use the calculated Patient Ratio above and the CHD Risk Table to determine the patient's CHD Risk.        ATP III CLASSIFICATION (LDL):  <100     mg/dL   Optimal  696-295100-129  mg/dL   Near or Above                    Optimal  130-159  mg/dL   Borderline  284-132160-189  mg/dL   High  >440>190     mg/dL   Very High Performed at [facility]   TSH     Status: None   Collection Time: 01/09/16  6:35 AM  Result Value Ref Range   TSH 0.563 0.350 - 4.500 uIU/mL    Comment: Performed at [facility]  Prolactin     Status: Abnormal   Collection Time: 01/09/16  6:35 AM  Result Value Ref Range   Prolactin 15.7 (H) 4.0 - 15.2 ng/mL    Comment: (NOTE) Performed At: [facility]  [address] [place], [place] [id number] Mila HomerHancock William F MD YQ:[id number]:[phone]  Performed at [facility]   TSH     Status: None   Collection Time: 01/10/16  6:30 AM  Result Value Ref Range   TSH 2.491 0.350 - 4.500 uIU/mL    Comment: Performed at [facility]  Lipid panel     Status: Abnormal   Collection Time: 01/10/16  6:30 AM  Result Value Ref Range   Cholesterol 180 0 - 200 mg/dL   Triglycerides 47 <161 mg/dL   HDL 69 >09 mg/dL    Total CHOL/HDL Ratio 2.6 RATIO   VLDL 9 0 - 40 mg/dL   LDL Cholesterol 604 (H) 0 - 99 mg/dL    Comment:        Total Cholesterol/HDL:CHD Risk Coronary Heart Disease Risk Table                     Men   Women  1/2 Average Risk   3.4   3.3  Average Risk       5.0   4.4  2 X Average Risk   9.6   7.1  3 X Average Risk  23.4   11.0        Use the calculated Patient Ratio above and the CHD Risk Table to determine the patient's CHD Risk.        ATP III CLASSIFICATION (LDL):  <100     mg/dL   Optimal  540-981  mg/dL   Near or Above                    Optimal  130-159  mg/dL   Borderline  191-478  mg/dL   High  >295     mg/dL   Very High Performed at [facility]     Blood Alcohol level:  Lab Results  Component Value Date   [facility] <5 01/08/2016   ETH <5 01/22/2015    Metabolic Disorder Labs: Lab Results  Component Value Date   HGBA1C 5.7 (H) 01/09/2016   MPG 117 01/09/2016   MPG 120 07/19/2014   Lab Results  Component Value Date   PROLACTIN 15.7 (H) 01/09/2016   PROLACTIN 20.7 (H) 01/26/2015   Lab Results  Component Value Date   CHOL 180 01/10/2016   TRIG 47 01/10/2016   HDL 69 01/10/2016   CHOLHDL 2.6 01/10/2016   VLDL 9 01/10/2016   LDLCALC 102 (H) 01/10/2016   LDLCALC 114 (H) 01/09/2016    Physical Findings: AIMS: Facial and Oral Movements Muscles of Facial Expression: None, normal Lips and Perioral Area: None, normal Jaw: None, normal Tongue: None, normal,Extremity Movements Upper (arms, wrists, hands, fingers): None, normal Lower (legs, knees, ankles, toes): None, normal, Trunk Movements Neck, shoulders, hips: None, normal, Overall Severity Severity of abnormal movements (highest score from questions above): None, normal Incapacitation due to abnormal movements: None, normal Patient's awareness of abnormal movements (rate only patient's report): No Awareness, Dental Status Current problems with teeth and/or dentures?: No Does patient usually wear  dentures?: No  CIWA:    COWS:     Musculoskeletal: Strength & Muscle Tone: within normal limits Gait & Station: normal Patient leans: N/A  Psychiatric Specialty Exam: Physical Exam  Constitutional: He is oriented to person, place, and time. He appears well-developed and well-nourished.  Neurological: He is alert and oriented to person, place, and time.  No rigidity, no tremors. No nystagmus     Review of Systems  Psychiatric/Behavioral: Negative for hallucinations, substance abuse and suicidal  ideas. The patient has insomnia. The patient is not nervous/anxious.   All other systems reviewed and are negative.   Blood pressure 107/73, pulse 90, temperature 98.5 F (36.9 C), resp. rate 16, height  (1.803 m), weight 215 lb (97.5 kg).Body mass index is 29.99 kg/m.  General Appearance: Disheveled  Eye Contact:  Poor stared at the naming badge during the entire interview  Speech:  Normal Rate  Volume:  Normal  Mood:  Irritable  Affect:  constricted, irritable at times  Thought Process:  Coherent  Orientation:  Full (Time, Place, and Person)  Thought Content:  poverty of thought, no paranoia. denies AH/VH  Suicidal Thoughts:  No  Homicidal Thoughts:  No  Memory:  Negative  Judgement:  Poor  Insight:  Lacking  Psychomotor Activity:  Decreased  Concentration:  Concentration: Fair and Attention Span: Fair  Recall:  [employer] of Knowledge:  Fair  Language:  Fair  Akathisia:  No  Handed:  Right  AIMS (if indicated):     Assets:  Communication Skills Desire for Improvement  ADL's:  Intact  Cognition:  WNL  Sleep:  Number of Hours: 6.5   Assessment Atilano Covelli a 35 y.o.AAmalewho has a hx of schizoaffective do, cannabis abuse and BZD abuse , who presented to [facility] due to SI, HI, and command AH in the setting of non adherence to medication and THC use.   Today's exam is notable for his constricted affect (stared at the name badge during the entire interview) and some  irritability. He endorses insomnia but denies SI/HI/Command AH; tolerating medication without significant side effects. No signs of EPS. Advised patient to take trazodone for insomnia. Continue current medication regimen.   Plans Patient will benefit from inpatient treatment and stabilization.  Reviewed past medical records,treatment plan.  Will continue Invega 6 mg po daily for psychosis and mood lability.Patient reports he received Invega sustenna IM end of July- pending collateral information from [facility] and resume . Will continue Lamictal 50 mg po daily for mood lability. Will continue Gabapentin 300 mg po bid for anxiety sx. Continue Cogentin 1 mg po daily for EPS. Will continue Trazodone 50 mg po qhs for sleep. Continue Vistaril 25 mg po tid prn for anxiety sx. Will continue to monitor vitals ,medication compliance and treatment side effects while patient is here.  Will monitor for medical issues as well as call consult as needed. \ EKG reviewed: QTc 389 msec on 8/8.  Reviewed TSH, lipid panels in normal range. HbA1c elevated to 5.7%; plan to monitor.  CSW is working on disposition. Obtain collateral from [facility]. Chaplain consult. Patient to participate in therapeutic milieu .   Treatment Plan Summary: Daily contact with patient to assess and evaluate symptoms and progress in treatment  Neysa Hotter, MD 01/10/2016, 11:40 AM

## 2016-01-10 NOTE — Progress Notes (Signed)
D: Pt was flat, isolative and withdrawn to room; was in bed all evening. Pt endorsed moderate depression; states, "I'm down; I just want to rest." Pt denied anxiety, SI, HI, pain or AVH.  A: Medications offered as prescribed.  Support, encouragement, and safe environment provided.  15-minute safety checks continue.  R: Pt was med compliant.  Pt attended group. Safety checks continue

## 2016-01-10 NOTE — Progress Notes (Signed)
Recreation Therapy Notes  01/10/16 1300:  LRT introduced self to patient.  Pt stated he was here for S/I and H/I.  Pt expressed that he has been dealing with the deaths of family and friends which occurred less than a year ago.  Pt stated the stress of losing his friends and family members have become to much.  LRT went over coping skills with pt and left a worksheet for pt to fill out so he can come up with more positive coping skills.   Caroll RancherMarjette Quenisha Lovins, LRT/CTRS   Caroll RancherLindsay, Jahleah Mariscal A 01/10/2016 1:07 PM

## 2016-01-10 NOTE — Progress Notes (Signed)
Recreation Therapy Notes  INPATIENT RECREATION THERAPY ASSESSMENT  Patient Details Name: Ronald Martinez MRN: [id number] DOB: 07/12/1980 Today's Date: 01/10/2016  Patient Stressors: Death  Pt stated two of his friends and a family member passed less than a year ago.  Coping Skills:   Isolate, Substance Abuse, Avoidance, Exercise, Talking, Music  Personal Challenges: Anger, Communication, Concentration, Expressing Yourself, Problem-Solving, Stress Management, Trusting Others, Work International aid/development workererformance  Leisure Interests (2+):  [employer]ature - Other (Comment), Community - Other (Comment) (Park, Insurance claims handlerresurant)  Awareness of Community Resources:  Yes  Community Resources:  Park, Engineer, petroleumGym, Public affairs consultantestaurants  Current Use: Yes  Patient Strengths:  Nice person, trustworthy  Patient Identified Areas of Improvement:  taking medication, "learn to talk about what's on my mind"  Current Recreation Participation:  2-3 times a week  Patient Goal for Hospitalization:  work on not having H/I, S/I thoughts  [place]  [place]idence[place]   Current [place] (including self-harm):  No  Current HI:  No  Consent to Intern Participation: N/A   Caroll RancherMarjette Temeca Somma, LRT/CTRS  Lillia AbedLindsay, Lashay Osborne A 01/10/2016, 1:30 PM

## 2016-01-10 NOTE — Progress Notes (Signed)
Recreation Therapy Notes  Date: 01/10/16 Time: 1000 Location: 500 [address] Dayroom  Group Topic: Coping Skills  Goal Area(s) Addresses:  Patient will be able to identify stressors. Patient will be able to identify positive coping skills. Patient will be able to identify how using positive will help them once d/c.  Intervention:  Worksheet, pencils  Activity: Web worksheet.  Pt is to identify all the things that keep them stuck and from moving forward in life and write it in the center of the spider web.  On the outside of the web, pt will identify positive coping skills they can use to help deal with their stressors and move forward.   Education: Coping Skills, Discharge Planning.    Clinical Observations/Feedback: Pt did not attend group.   Abbagail Scaff, LRT/CTRS  

## 2016-01-10 NOTE — Progress Notes (Signed)
DAR NOTE: Patient presents with anxious affect and depressed mood.  Reports suicidal thoughts on self inventory form but contracts for safety during assessment.  Rates depression at 7, hopelessness at 6, and anxiety at 5.  Described energy level as low and concentration as good.  Maintained on routine safety checks.  Medications given as prescribed.  Support and encouragement offered as needed.  Attended group and participated. Patient observed socializing with peers in the dayroom.  Patient requested and received Zyprexa 5 mg for complain of anxiety and agitation with good effect.  Patient states goal is to work on discharge plan.

## 2016-01-11 LAB — HEMOGLOBIN A1C
Hgb A1c MFr Bld: 5.6 % (ref 4.8–5.6)
Mean Plasma Glucose: 114 mg/dL

## 2016-01-11 NOTE — Progress Notes (Signed)
D: Pt was flat, isolative and withdrawn to self even while in the dayroom. Pt was also observed having a conversation with self while pacing the hallway (actively hallucinating); states, "they want me to hurt someone outside of here and they is the only way I know to block them out." Pt continued to endorse moderate depression and anxiety. Pt denied SI, HI, pain or AVH.  A: Medications offered as prescribed.  Support, encouragement, and safe environment provided.  15-minute safety checks continue. R: Pt was med compliant.  Pt attended group. Safety checks continue.

## 2016-01-11 NOTE — Progress Notes (Signed)
Patient ID: Ronald Martinez, male   DOB: 20-Dec-1980, 35 y.o.   MRN: [id number]  [facility] MD Progress Note  01/11/2016 2:17 PM Ronald Martinez  MRN:  [id number] Subjective:   Patient seen, chart reviewed and case discussed with nursing staff.  No behavioral issues reported.   Patient reports that he is feeling good. He denies any SI today. He denies AH/VH. He states that he has stuttering since he was a child.   Principal Problem: Schizoaffective disorder, bipolar type (HCC) Diagnosis:   Patient Active Problem List   Diagnosis Date Noted  . 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 Cannabis use disorder, severe, dependence (HCC) [F12.20] 08/17/2014  . Schizoaffective disorder, bipolar type (HCC) [F25.0] 08/16/2014   Total Time spent with patient: 15 minutes  Past Psychiatric History: see HPI  Past Medical History:  Past Medical History:  Diagnosis Date  . Asthma   . Bipolar 1 disorder (HCC)   . Depression   . Schizophrenia (HCC)    History reviewed. No pertinent surgical history. Family History:  Family History  Problem Relation Age of Onset  . Asthma Mother   . Alcoholism Other    Family Psychiatric  History: see HPI Social History:  History  Alcohol Use  . Yes     History  Drug Use  . Types: Marijuana, Benzodiazepines    Social History   Social History  . Marital status: Single    Spouse name: N/A  . Number of children: N/A  . Years of education: N/A   Social History Main Topics  . Smoking status: Current Some Day Smoker    Packs/day: 1.00    Types: Cigarettes  . Smokeless tobacco: Never Used  . Alcohol use Yes  . Drug use:     Types: Marijuana, Benzodiazepines  . Sexual activity: No   Other Topics Concern  . None   Social History Narrative  . None   Additional Social History:     no update                    Sleep: Fair  Appetite:  Good  Current Medications: Current Facility-Administered Medications  Medication Dose Route  Frequency Provider Last Rate Last Dose  . acetaminophen (TYLENOL) tablet 650 mg  650 mg Oral Q6H PRN Kerry Hough, PA-C      . albuterol (PROVENTIL HFA;VENTOLIN HFA) 108 (90 Base) MCG/ACT inhaler 2 puff  2 puff Inhalation Q4H PRN Kerry Hough, PA-C      . alum & mag hydroxide-simeth (MAALOX/MYLANTA) 200-200-20 MG/5ML suspension 30 mL  30 mL Oral Q4H PRN Oneta Rack, NP      . benztropine (COGENTIN) tablet 1 mg  1 mg Oral Daily Kerry Hough, PA-C   1 mg at 01/11/16 9629  . gabapentin (NEURONTIN) capsule 300 mg  300 mg Oral BID Kerry Hough, PA-C   300 mg at 01/11/16 5284  . ibuprofen (ADVIL,MOTRIN) tablet 600 mg  600 mg Oral Q6H PRN Kerry Hough, PA-C      . lamoTRIgine (LAMICTAL) tablet 50 mg  50 mg Oral Daily Kerry Hough, PA-C   50 mg at 01/11/16 1324  . magnesium hydroxide (MILK OF MAGNESIA) suspension 30 mL  30 mL Oral Daily PRN Oneta Rack, NP      . nicotine (NICODERM CQ - dosed in mg/24 hours) patch 21 mg  21 mg Transdermal Daily Saramma Eappen, MD      . OLANZapine zydis (ZYPREXA)  disintegrating tablet 5 mg  5 mg Oral Q8H PRN Kerry HoughSpencer E Simon, PA-C   5 mg at 01/10/16 1511   And  . ziprasidone (GEODON) injection 20 mg  20 mg Intramuscular PRN Kerry HoughSpencer E Simon, PA-C      . paliperidone (INVEGA) 24 hr tablet 6 mg  6 mg Oral Daily Kerry HoughSpencer E Simon, PA-C   6 mg at 01/11/16 16100811  . traZODone (DESYREL) tablet 50 mg  50 mg Oral QHS,MR X 1 Kerry HoughSpencer E Simon, PA-C   50 mg at 01/10/16 2118    Lab Results:  Results for orders placed or performed during the hospital encounter of 01/08/16 (from the past 48 hour(s))  TSH     Status: None   Collection Time: 01/10/16  6:30 AM  Result Value Ref Range   TSH 2.491 0.350 - 4.500 uIU/mL    Comment: Performed at [facility]  Lipid panel     Status: Abnormal   Collection Time: 01/10/16  6:30 AM  Result Value Ref Range   Cholesterol 180 0 - 200 mg/dL   Triglycerides 47 <960<150 mg/dL   HDL 69 >45>40 mg/dL   Total CHOL/HDL  Ratio 2.6 RATIO   VLDL 9 0 - 40 mg/dL   LDL Cholesterol 409102 (H) 0 - 99 mg/dL    Comment:        Total Cholesterol/HDL:CHD Risk Coronary Heart Disease Risk Table                     Men   Women  1/2 Average Risk   3.4   3.3  Average Risk       5.0   4.4  2 X Average Risk   9.6   7.1  3 X Average Risk  23.4   11.0        Use the calculated Patient Ratio above and the CHD Risk Table to determine the patient's CHD Risk.        ATP III CLASSIFICATION (LDL):  <100     mg/dL   Optimal  [id number]  mg/dL   Near or Above                    Optimal  130-159  mg/dL   Borderline  [id number]  mg/dL   High  >213>190     mg/dL   Very High Performed at [facility]   Hemoglobin A1c     Status: None   Collection Time: 01/10/16  6:30 AM  Result Value Ref Range   Hgb A1c MFr Bld 5.6 4.8 - 5.6 %    Comment: (NOTE)         Pre-diabetes: 5.7 - 6.4         Diabetes: >6.4         Glycemic control for adults with diabetes: <7.0    Mean Plasma Glucose 114 mg/dL    Comment: (NOTE) Performed At: [facility] [address] [place], [place] [id number] Mila HomerHancock William F MD GE:[phone] Performed at [facility]     Blood Alcohol level:  Lab Results  Component Value Date   [facility] 01/08/2016   ETH <5 01/22/2015    Metabolic Disorder Labs: Lab Results  Component Value Date   HGBA1C 5.6 01/10/2016   MPG 114 01/10/2016   MPG 117 01/09/2016   Lab Results  Component Value Date   PROLACTIN 15.7 (H) 01/09/2016   PROLACTIN 20.7 (H) 01/26/2015   Lab  Results  Component Value Date   CHOL 180 01/10/2016   TRIG 47 01/10/2016   HDL 69 01/10/2016   CHOLHDL 2.6 01/10/2016   VLDL 9 01/10/2016   LDLCALC 102 (H) 01/10/2016   LDLCALC 114 (H) 01/09/2016    Physical Findings: AIMS: Facial and Oral Movements Muscles of Facial Expression: None, normal Lips and Perioral Area: None, normal Jaw: None, normal Tongue: None, normal,Extremity Movements Upper (arms, wrists,  hands, fingers): None, normal Lower (legs, knees, ankles, toes): None, normal, Trunk Movements Neck, shoulders, hips: None, normal, Overall Severity Severity of abnormal movements (highest score from questions above): None, normal Incapacitation due to abnormal movements: None, normal Patient's awareness of abnormal movements (rate only patient's report): No Awareness, Dental Status Current problems with teeth and/or dentures?: No Does patient usually wear dentures?: No  CIWA:    COWS:     Musculoskeletal: Strength & Muscle Tone: within normal limits Gait & Station: normal Patient leans: N/A  Psychiatric Specialty Exam: Physical Exam  Constitutional: He is oriented to person, place, and time. He appears well-developed and well-nourished.  Neurological: He is alert and oriented to person, place, and time.  No rigidity, no tremors. No nystagmus     Review of Systems  HENT:       Stuttering   Psychiatric/Behavioral: Negative for hallucinations, substance abuse and suicidal ideas. The patient is not nervous/anxious and does not have insomnia.   All other systems reviewed and are negative.   Blood pressure 139/74, pulse 95, temperature 98.5 F (36.9 C), temperature source Oral, resp. rate 16, height 5\' 11"  (1.803 m), weight 215 lb (97.5 kg).Body mass index is 29.99 kg/m.  General Appearance: Fairly Groomed  Eye Contact:  Minimal staring down  Speech:  Normal Rate  Volume:  Normal  Mood:  better  Affect:  constricted, calm  Thought Process:  Coherent  Orientation:  Full (Time, Place, and Person)  Thought Content:  poverty of thought, no paranoia. denies AH/VH  Suicidal Thoughts:  No  Homicidal Thoughts:  No  Memory:  Negative  Judgement:  Poor  Insight:  Lacking  Psychomotor Activity:  Decreased  Concentration:  Concentration: Fair and Attention Span: Fair  Recall:  [employer] of Knowledge:  Fair  Language:  Fair  Akathisia:  No  Handed:  Right  AIMS (if indicated):      Assets:  Communication Skills Desire for Improvement  ADL's:  Intact  Cognition:  WNL  Sleep:  Number of Hours: 5.5   Assessment Larico Colbertis a 35 y.o.AAmalewho has a hx of schizoaffective do, cannabis abuse and BZD abuse , who presented to [facility] due to SI, HI, and command AH in the setting of non adherence to medication and THC use.   Today's exam is notable for his constricted affect and poverty of thought. However there is an improvement in his SI/HI/hallucinations and he denies any today. Will continue current medication. No signs of EPS.   Plans Patient will benefit from inpatient treatment and stabilization.  Reviewed past medical records,treatment plan.  Will continue Invega 6 mg po daily for psychosis and mood lability.Per report, net due on Invega shot is on 8/18.  (EKG: QTc 389 msec on 8/8.)  Will continue Lamictal 50 mg po daily for mood lability. Will continue Gabapentin 300 mg po bid for anxiety sx. Continue Cogentin 1 mg po daily for EPS. Will continue Trazodone 50 mg po qhs for sleep. Continue Vistaril 25 mg po tid prn for anxiety sx. Will continue to  monitor vitals ,medication compliance and treatment side effects while patient is here.  Will monitor for medical issues as well as call consult as needed.  Reviewed TSH, lipid panels in normal range. HbA1c elevated to 5.7%; plan to monitor.  CSW is working on disposition.  Patient to participate in therapeutic milieu .   Treatment Plan Summary: Daily contact with patient to assess and evaluate symptoms and progress in treatment  Neysa Hotter, MD 01/11/2016, 2:17 PM

## 2016-01-11 NOTE — [facility] Suicide Risk Assessment (Signed)
[facility] INPATIENT:  Family/Significant Other Suicide Prevention Education  Suicide Prevention Education:  Patient Refusal for Family/Significant Other Suicide Prevention Education: The patient Ronald Martinez has refused to provide written consent for family/significant other to be provided Family/Significant Other Suicide Prevention Education during admission and/or prior to discharge.  Physician notified.  Ida RogueRodney B Cortavious Nix 01/11/2016, 3:41 PM

## 2016-01-11 NOTE — Progress Notes (Addendum)
  [facility] :  Will you be returning to the same living situation after discharge:  Yes,  home At discharge, do you have transportation home?: Yes,  ACT team Do you have the ability to pay for your medications: Yes,  MCD  Release of information consent forms completed and in the chart;  Patient's signature needed at discharge.  Patient to Follow up at: Follow-up Information    [facility] Follow up on 01/12/2016.   Why:  Someone from the ACT team will pick you up the day of d/c Contact information: [address]  [place] [[phone]] [phone]          Next level of care provider has access to [facility] Link:no  Safety Planning and Suicide Prevention discussed: Yes,  yes  Have you used any form of tobacco in the last 30 days? (Cigarettes, Smokeless Tobacco, Cigars, and/or Pipes): Yes  Has patient been referred to the Quitline?: Patient refused referral  Patient has been referred for addiction treatment: Yes  Ronald Martinez 01/11/2016, 3:44 PM

## 2016-01-11 NOTE — Progress Notes (Signed)
DAR NOTE: Patient presents with anxious affect and depressed mood.  Denies pain, auditory and visual hallucinations.  Rates depression at 4, hopelessness at 3, and anxiety at 5.  Energy level as normal and concentration as good.  Maintained on routine safety checks.  Medications given as prescribed.  Support and encouragement offered as needed.  Attended group and participated.  States goal for today is "work on discharge planning."  Patient observed pacing on the unit.

## 2016-01-11 NOTE — Tx Team (Signed)
Interdisciplinary Treatment Plan Update (Adult)  Date:  01/11/2016  Time Reviewed:  12:31 PM   Progress in Treatment: Attending groups: No. New to unit. Continuing to assess.  Participating in groups:  No. Taking medication as prescribed:  Yes. Tolerating medication:  Yes. Family/Significant othe contact made:  No Patient understands diagnosis:  Yes. and As evidenced by:  seeking treatment for AH, passive SI, HI (passive) toward an ex friend, mood lability, and for medication stabilization. Discussing patient identified problems/goals with staff:  Yes. Medical problems stabilized or resolved:  Yes. Denies suicidal/homicidal ideation: Yes. Issues/concerns per patient self-inventory:  Other:  New problem(s) identified:  Pt has limited insight at this time and does not recognize correlation between stopping his mental health medications and onset of symptoms.   Discharge Plan or Barriers: Pt is current with [facility] ACT team in [place]. Pt last admitted to [facility]: 01/24/15, 08/16/14, 07/15/14, for similar issues.   Reason for Continuation of Hospitalization:   Comments:  Ronald Martinez is a 35 y.o. male who presents to [facility] due to [place], HI, and command AH. Pt reports that he has an ACCT team with [facility] [phone]), who delivers meds to him home once a week. Pt reports that he d/c his meds 3 weeks ago "b/c it wasn't working. I ain't see no change". Pt indicates that he thinks he told his ACCT team that he stopped taking his meds, but he wasn't for sure. Pt reports that he started having SI w/ plans to "slash my wrist" about 2 weeks ago. Pt also indicates that, at the same time, he started having HI towards an ex-friend who's "been getting on my nerves...keeps pushing buttons". Pt discloses that he's been having thoughts to "slash him". Pt also reports having AH telling him negative things, such as "you're not worth anything...get rid of yourself" and "get rid of him". Pt also reports decreased sleep.  During initial assessment, the writer called pt's ACCT team to report pt's presentation to the ED and the reason. Writer was advised that pt did not call the crisis phone nor make them aware of his issues. Writer was also advised that this is not a normal occurrence for pt and he is not at baseline. Diagnosis: Schizoaffective d/o, bipolar type  Estimated length of stay:  Likley d/ tomorrow  New goal(s): to develop effective aftercare plan.   Additional Comments:  Patient and CSW reviewed pt's identified goals and treatment plan. Patient verbalized understanding and agreed to treatment plan. CSW reviewed [facility] "Discharge Process and Patient Involvement" Form. Pt verbalized understanding of information provided and signed form.    Review of initial/current patient goals per problem list:  1. Goal(s): Patient will participate in aftercare plan  Met: Yes  Target date: at discharge  As evidenced by: Patient will participate within aftercare plan AEB aftercare provider and housing plan at discharge being identified.  8/8: Pt plans to return home; resume services with [facility] ACTT upon discharge from the hospital.   2. Goal (s): Patient will exhibit decreased depressive symptoms and suicidal ideations.  Met: Yes   Target date: at discharge  As evidenced by: Patient will utilize self rating of depression at 3 or below and demonstrate decreased signs of depression or be deemed stable for discharge by MD.  8/8: Pt rates depression as high but denies SI/HI at this time.  8/10:  Pt denies SI, rates his depression a 2 today  3. Goal(s): Patient will demonstrate decreased signs and symptoms of psychosis.  Met:Yes  Target date: at discharge  As evidenced by: Patient will report no AVH upon discharge and will demonstrate clear; coherent thought process.   8/8: Pt continues to reports AH-negative in nature, some paranoia, and mood lability; poor insight.  01/11/16:  Pt denies all symptoms  today    Attendees: Patient:   01/11/2016 12:31 PM   Family:   01/11/2016 12:31 PM   Physician:  Dr. Shea Evans MD; Dr. Parke Poisson MD 01/11/2016 12:31 PM   Nursing:   ; Benjamine Mola RN 01/11/2016 12:31 PM   Clinical Social Worker: Maxie Better, LCSW 01/11/2016 12:31 PM   Clinical Social Worker: Roque Lias LCSW 01/11/2016 12:31 PM   Other:   01/11/2016 12:31 PM   Other:  01/11/2016 12:31 PM   Other:   01/11/2016 12:31 PM   Other:  01/11/2016 12:31 PM   Other:  01/11/2016 12:31 PM   Other:  01/11/2016 12:31 PM    01/11/2016 12:31 PM    01/11/2016 12:31 PM    01/11/2016 12:31 PM    01/11/2016 12:31 PM    Scribe for Treatment Team:   Maxie Better, LCSW 01/11/2016 12:31 PM

## 2016-01-11 NOTE — Progress Notes (Signed)
Recreation Therapy Notes  Date: 01/11/16 Time: 1000 Location: 500 Hall Dayroom  Group Topic: Leisure Education  Goal Area(s) Addresses:  Patient will identify positive leisure activities.  Patient will identify one positive benefit of participation in leisure activities.   Behavioral Response: Observed  Intervention: Magazines, scissors, glue sticks, construction paper  Activity: Got Rec?:  Patients were to look through magazines and cut out any pictures or words that described any positive leisure activities that they participate in or would like to participate in.  Patients were to take the pictures and words they found and glue them to the construction paper, creating a leisure collage.    Education:  Leisure Education, Building control surveyorDischarge Planning  Education Outcome: Acknowledges education/In group clarification offered/Needs additional education  Clinical Observations/Feedback: Pt arrived late and left early because pt stated he wasn't feeling well.   Caroll RancherMarjette Javeion Cannedy, LRT/CTRS

## 2016-01-11 NOTE — [facility] Group Notes (Signed)
[facility] Group Notes:  (Counselor/Nursing/MHT/Case Management/Adjunct)  01/11/2016 1:15PM  Type of Therapy:  Group Therapy  Participation Level:  Active  Participation Quality:  Appropriate  Affect:  Flat  Cognitive:  Oriented  Insight:  Improving  Engagement in Group:  Limited  Engagement in Therapy:  Limited  Modes of Intervention:  Discussion, Exploration and Socialization  Summary of Progress/Problems: The topic for group was balance in life.  Pt participated in the discussion about when their life was in balance and out of balance and how this feels.  Pt discussed ways to get back in balance and short term goals they can work on to get where they want to be. Stayed for first half, engaged during that time.  Talked about feeling balanced "because my meds are working-the Dr made some adjustments." States he also now knows its the [place] injection that is causing negative reactions.  "I just need to take the pill-needles remind me of junkies sitting around in [place] shooting up."   Ida Rogueorth, Mordechai Matuszak B 01/11/2016 3:42 PM

## 2016-01-11 NOTE — Progress Notes (Signed)
Recreation Therapy Notes  01/11/16 1315:  LRT met with pt to review his coping skills.  Pt completed his worksheet.  Pt was flat but engaged.  Pt named some of his stressors as depression and suicide.  Pt named some of his coping skills as taking a walk, treat yourself and clean the house.  Pt also stated he was feeling better than he felt this morning.  LRT will follow up with pt.   Victorino Sparrow, LRT/CTRS     Ria Comment, Andrei Mccook A 01/11/2016 1:23 PM

## 2016-01-12 MED ORDER — LAMOTRIGINE 25 MG PO TABS: 50.0000 mg | ORAL_TABLET | Freq: Every day | ORAL | 0 refills | Status: DC

## 2016-01-12 MED ORDER — BENZTROPINE MESYLATE 1 MG PO TABS: 1.0000 mg | ORAL_TABLET | Freq: Every day | ORAL | 0 refills | Status: DC

## 2016-01-12 MED ORDER — GABAPENTIN 300 MG PO CAPS: 300.0000 mg | ORAL_CAPSULE | Freq: Two times a day (BID) | ORAL | 0 refills | Status: DC

## 2016-01-12 MED ORDER — NICOTINE 21 MG/24HR TD PT24: 21.0000 mg | MEDICATED_PATCH | Freq: Every day | TRANSDERMAL | 0 refills | Status: DC

## 2016-01-12 MED ORDER — PALIPERIDONE ER 6 MG PO TB24: 6.0000 mg | ORAL_TABLET | Freq: Every day | ORAL | 0 refills | Status: DC

## 2016-01-12 MED ORDER — TRAZODONE HCL 50 MG PO TABS: 50.0000 mg | ORAL_TABLET | Freq: Every evening | ORAL | 0 refills | Status: DC | PRN

## 2016-01-12 NOTE — Progress Notes (Signed)
Recreation Therapy Notes  Date: 01/12/16 Time: 1015 Location: 500 Hall Day Room  Group Topic: Stress Management  Goal Area(s) Addresses:  Patient will verbalize importance of using healthy stress management.  Patient will identify positive emotions associated with healthy stress management.   Intervention: Progressive Muscle Relaxation, Guided Imagery Script  Activity :  Progressive Muscle Relaxation, Peaceful Waves Scripts.  LRT introduced the techniques of PMR and Guided Imagery to the patients.  Patients were asked to follow along as LRT read scripts so they could participate in activity.  Education:  Stress Management, Discharge Planning.   Education Outcome: Needs additional education  Clinical Observations/Feedback: Pt did not attend group.   Carney Saxton, LRT/CTRS  

## 2016-01-12 NOTE — Discharge Summary (Signed)
Physician Discharge Summary Note  Patient:  Ronald Martinez is an 35 y.o., male MRN:  [id number] DOB:  [birth year] Patient phone:  [phone] (home)  Patient address:   [address] [place] [address],  Total Time spent with patient: 30 minutes  Date of Admission:  01/08/2016 Date of Discharge:  01/12/2016  Reason for Admission:  Psychosis, hallucinations  Principal Problem: Schizoaffective disorder, bipolar type [facility]) Discharge Diagnoses: Patient Active Problem List   Diagnosis Date Noted  . 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 Cannabis use disorder, severe, dependence (HCC) [F12.20] 08/17/2014  . Schizoaffective disorder, bipolar type (HCC) [F25.0] 08/16/2014    Past Psychiatric History:  See HPI  Past Medical History:  Past Medical History:  Diagnosis Date  . Asthma   . Bipolar 1 disorder (HCC)   . Depression   . Schizophrenia (HCC)    History reviewed. No pertinent surgical history. Family History:  Family History  Problem Relation Age of Onset  . Asthma Mother   . Alcoholism Other    Family Psychiatric  History: see HPI Social History:  History  Alcohol Use  . Yes     History  Drug Use  . Types: Marijuana, Benzodiazepines    Social History   Social History  . Marital status: Single    Spouse name: N/A  . Number of children: N/A  . Years of education: N/A   Social History Main Topics  . Smoking status: Current Some Day Smoker    Packs/day: 1.00    Types: Cigarettes  . Smokeless tobacco: Never Used  . Alcohol use Yes  . Drug use:     Types: Marijuana, Benzodiazepines  . Sexual activity: No   Other Topics Concern  . None   Social History Narrative  . None   Hospital Course:  Ronald Martinez a 35 y.o.AAmalewho has a hx of schizoaffective do , cannabis abuse and BZD abuse , who presented to [facility] due to SI, HI, and command AH.   [name] was admitted for Schizoaffective disorder, bipolar type [facility]) and  crisis management.  He was treated with meds and their indications listed below.  Medical problems were identified and treated as needed.  Home medications were restarted as appropriate.  Improvement was monitored by observation and Ronald Martinez daily report of symptom reduction.  Emotional and mental status was monitored by daily self inventory reports completed by Ronald Martinez and clinical staff.  Patient reported continued improvement, denied any new concerns.  Patient had been compliant on medications and denied side effects.  Support and encouragement was provided.    Patient encouraged to attend groups to help with recognizing triggers of emotional crises and de-stabilizations.  Patient encouraged to attend group to help identify the positive things in life that would help in dealing with feelings of loss, depression and unhealthy or abusive tendencies.         Ronald Martinez was evaluated by the treatment team for stability and plans for continued recovery upon discharge.  He was offered further treatment options upon discharge including Residential, Intensive Outpatient and Outpatient treatment.  He will follow up with agencies listed below for medication management and counseling.  Encouraged patient to maintain satisfactory support network and home environment.  Advised to adhere to medication compliance and outpatient treatment follow up.  Prescriptions provided.       Ronald Martinez motivation was an integral factor for scheduling further treatment.  Employment, transportation, bed availability, health status, family support, and  any pending legal issues were also considered during her hospital stay.  Upon completion of this admission the patient was both mentally and medically stable for discharge denying suicidal/homicidal ideation, auditory/visual/tactile hallucinations, delusional thoughts and paranoia.      Physical Findings: AIMS: Facial and Oral Movements Muscles of Facial Expression:  None, normal Lips and Perioral Area: None, normal Jaw: None, normal Tongue: None, normal,Extremity Movements Upper (arms, wrists, hands, fingers): None, normal Lower (legs, knees, ankles, toes): None, normal, Trunk Movements Neck, shoulders, hips: None, normal, Overall Severity Severity of abnormal movements (highest score from questions above): None, normal Incapacitation due to abnormal movements: None, normal Patient's awareness of abnormal movements (rate only patient's report): No Awareness, Dental Status Current problems with teeth and/or dentures?: No Does patient usually wear dentures?: No  CIWA:    COWS:     Musculoskeletal: Strength & Muscle Tone: within normal limits Gait & Station: normal Patient leans: N/A  Psychiatric Specialty Exam: Physical Exam  Nursing note and vitals reviewed. Constitutional: He appears well-developed.  Psychiatric: His mood appears not anxious. Thought content is not paranoid. Cognition and memory are not impaired. He does not express impulsivity or inappropriate judgment. He does not exhibit a depressed mood. He expresses no homicidal and no suicidal ideation.    Review of Systems  Constitutional: Negative for fever.  Eyes: Negative for blurred vision.  Respiratory: Negative for cough.   Cardiovascular: Negative for chest pain.  Gastrointestinal: Negative for heartburn.  Genitourinary: Negative for dysuria.  Musculoskeletal: Negative for myalgias.  Skin: Negative for rash.  Neurological: Negative for dizziness and headaches.  Endo/Heme/Allergies: Does not bruise/bleed easily.  Psychiatric/Behavioral: Negative for depression.  All other systems reviewed and are negative.   Blood pressure (!) 104/58, pulse (!) 101, temperature 98.7 F (37.1 C), resp. rate 18, height  (1.803 m), weight 97.5 kg (215 lb).Body mass index is 29.99 kg/m.   Have you used any form of tobacco in the last 30 days? (Cigarettes, Smokeless Tobacco, Cigars,  and/or Pipes): Yes  Has this patient used any form of tobacco in the last 30 days? (Cigarettes, Smokeless Tobacco, Cigars, and/or Pipes) Yes, No  Blood Alcohol level:  Lab Results  Component Value Date   ETH <5 01/08/2016   ETH <5 01/22/2015    Metabolic Disorder Labs:  Lab Results  Component Value Date   HGBA1C 5.6 01/10/2016   MPG 114 01/10/2016   MPG 117 01/09/2016   Lab Results  Component Value Date   PROLACTIN 15.7 (H) 01/09/2016   PROLACTIN 20.7 (H) 01/26/2015   Lab Results  Component Value Date   CHOL 180 01/10/2016   TRIG 47 01/10/2016   HDL 69 01/10/2016   CHOLHDL 2.6 01/10/2016   VLDL 9 01/10/2016   LDLCALC 102 (H) 01/10/2016   LDLCALC 114 (H) 01/09/2016    See Psychiatric Specialty Exam and Suicide Risk Assessment completed by Attending Physician prior to discharge.  Discharge destination:  Home  Is patient on multiple antipsychotic therapies at discharge:  No   Has Patient had three or more failed trials of antipsychotic monotherapy by history:  No  Recommended Plan for Multiple Antipsychotic Therapies: NA     Medication List    STOP taking these medications   albuterol 108 (90 Base) MCG/ACT inhaler Commonly known as:  PROVENTIL HFA;VENTOLIN HFA   ibuprofen 600 MG tablet Commonly known as:  ADVIL,MOTRIN   INVEGA SUSTENNA 234 MG/1.5ML Susp injection Generic drug:  paliperidone   naproxen 250 MG tablet Commonly known  as:  NAPROSYN   penicillin v potassium 500 MG tablet Commonly known as:  VEETID   predniSONE 10 MG tablet Commonly known as:  DELTASONE     TAKE these medications     Indication  benztropine 1 MG tablet Commonly known as:  COGENTIN Take 1 tablet (1 mg total) by mouth daily. What changed:  when to take this  Indication:  Extrapyramidal Reaction caused by Medications   gabapentin 300 MG capsule Commonly known as:  NEURONTIN Take 1 capsule (300 mg total) by mouth 2 (two) times daily.  Indication:  Agitation    lamoTRIgine 25 MG tablet Commonly known as:  LAMICTAL Take 2 tablets (50 mg total) by mouth daily.  Indication:  Mood control   nicotine 21 mg/24hr patch Commonly known as:  NICODERM CQ - dosed in mg/24 hours Place 1 patch (21 mg total) onto the skin daily.  Indication:  Nicotine Addiction   paliperidone 6 MG 24 hr tablet Commonly known as:  INVEGA Take 1 tablet (6 mg total) by mouth daily.  Indication:  Schizoaffective Disorder   traZODone 50 MG tablet Commonly known as:  DESYREL Take 1 tablet (50 mg total) by mouth at bedtime and may repeat dose one time if needed. What changed:  when to take this  Indication:  Aggressive Behavior, Trouble Sleeping      Follow-up Information    [facility] Follow up on 01/12/2016.   Why:  Someone from the ACT team will pick you up the day of d/c Contact information: [address]  [place] [[phone]          Follow-up recommendations:  Activity:  as tol Diet:  as tol  Comments:  1.  Take all your medications as prescribed.   2.  Report any adverse side effects to outpatient provider. 3.  Patient instructed to not use alcohol or illegal drugs while on prescription medicines. 4.  In the event of worsening symptoms, instructed patient to call 911, the crisis hotline or go to nearest emergency room for evaluation of symptoms.  Signed: Lindwood QuaSheila May Dulse Rutan, NP [facility] 01/12/2016, 2:26 PM

## 2016-01-12 NOTE — Progress Notes (Signed)
Patient discharged per physician order; patient denies SI/HI and A/V hallucinations; patient received prescriptions,  AVS, suicide risk assessment note, and transition record given to the patient after it was reviewed; patient had no other questions or concerns at this time; patient verbalized and signed that all belongings were returned; patient left the unit ambulatory with his ACT Team represenative

## 2016-01-12 NOTE — [facility] Suicide Risk Assessment (Signed)
[facility]   Principal Problem: Schizoaffective disorder, bipolar type [facility](HCC) Discharge Diagnoses:  Patient Active Problem List   Diagnosis Date Noted  . 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 Cannabis use disorder, severe, dependence (HCC) [F12.20] 08/17/2014  . Schizoaffective disorder, bipolar type (HCC) [F25.0] 08/16/2014    Total Time spent with patient: 15 minutes  Musculoskeletal: Strength & Muscle Tone: within normal limits Gait & Station: normal Patient leans: N/A  Psychiatric Specialty Exam: Review of Systems  Psychiatric/Behavioral: Negative for hallucinations and suicidal ideas. The patient does not have insomnia.   All other systems reviewed and are negative.   Blood pressure (!) 104/58, pulse (!) 101, temperature 98.7 F (37.1 C), resp. rate 18, height 5\' 11"  (1.803 m), weight 215 lb (97.5 kg).Body mass index is 29.99 kg/m.  General Appearance: Fairly Groomed  Patent attorneyye Contact::  Minimal  Speech:  Normal Rate409  Volume:  Normal  Mood:  fine  Affect:  less constricted  Thought Process:  Coherent poverty of thought  Orientation:  Full (Time, Place, and Person)  Thought Content:  Logical Perceptions: denies AH/VH  Suicidal Thoughts:  No  Homicidal Thoughts:  No  Memory:  intact  Judgement:  Fair  Insight:  Present  Psychomotor Activity:  Decreased improving  Concentration:  Fair  Recall:  FiservFair  Fund of Knowledge:Fair  Language: Poor  Akathisia:  No  Handed:  Right  AIMS (if indicated):     Assets:  Desire for Improvement  Sleep:  Number of Hours: 6.75  Cognition: WNL  ADL's:  Intact   Mental Status Per Nursing Assessment::   On Admission:  Suicidal ideation indicated by patient, Suicide plan, Self-harm thoughts  Demographic Factors:  Male, Living alone and Unemployed  Loss Factors: Loss of significant relationship His friend deceased past year by a car accident  Historical Factors: Prior suicide  attempts Overdosed medication 12 years ago  Risk Reduction Factors:   Positive therapeutic relationship and Positive coping skills or problem solving skills  Continued Clinical Symptoms:  Alcohol/Substance Abuse/Dependencies Schizophrenia:   Paranoid or undifferentiated type  Cognitive Features That Contribute To Risk:  None    Suicide Risk:  Mild:  Suicidal ideation of limited frequency, intensity, duration, and specificity.  There are no identifiable plans, no associated intent, mild dysphoria and related symptoms, good self-control (both objective and subjective assessment), few other risk factors, and identifiable protective factors, including available and accessible social support.  Follow-up Information    [facility] Follow up on 01/12/2016.   Why:  Someone from the ACT team will pick you up the day of d/c Contact information: [address]  [place] [[phone]] [phone]          Plan Of Care/Follow-up recommendations:  Activity:  full Diet:  regular Tests:  none Other:  follow up appointment as above He will be discharged to his apartment. He agrees to be followed up by ACT. He is future oriented, and is motivated for sobriety from marijuana use.   Neysa Hottereina Elia Nunley, MD 01/12/2016, 10:41 AM

## 2016-01-12 NOTE — Progress Notes (Signed)
Data. Patient denies SI/HI/AVH. Patient interacting well with staff and other patients. Patient reports n his self inventory, 0/10 for depression and hopelessness, and 4/10 for anxiety. His goal today is: "Work on discharge". Action. Emotional support and encouragement offered. Education provided on medication, indications and side effect. Q 15 minute checks done for safety. Response. Safety on the unit maintained through 15 minute checks.  Medications taken as prescribed. Attended groups. Remained calm and appropriate through out shift.

## 2016-01-12 NOTE — Plan of Care (Signed)
Problem: Activity: Goal: Interest or engagement in activities will improve Outcome: Adequate for Discharge Pt has been engaged in groups and other unit activities Goal: Sleeping patterns will improve Outcome: Adequate for Discharge Pt is sleeping 5 to 6 hours for two nights  Problem: Education: Goal: Emotional status will improve Outcome: Adequate for Discharge Pt mood is stable

## 2016-01-12 NOTE — Progress Notes (Signed)
Recreation Therapy Notes  01/12/16 1005:  LRT met with pt.  Pt was brighter and stated he was ready to go home.  Pt is discharging today.  LRT encouraged pt to use his coping skills.  Pt expressed that he would use his coping skills to help him once discharged.    Ronald Martinez, LRT/CTRS         Ria Comment, Jadin Creque A 01/12/2016 1:14 PM

## 2016-05-06 ENCOUNTER — Emergency Department (HOSPITAL_COMMUNITY): Payer: Medicaid Other

## 2016-05-06 ENCOUNTER — Emergency Department (HOSPITAL_COMMUNITY)
Admission: EM | Admit: 2016-05-06 | Discharge: 2016-05-06 | Disposition: A | Discharge status: Home / Self Care | Payer: Medicaid Other | Attending: Physician Assistant | Admitting: Physician Assistant

## 2016-05-06 ENCOUNTER — Encounter (HOSPITAL_COMMUNITY): Payer: Self-pay | Admitting: Vascular Surgery

## 2016-05-06 DIAGNOSIS — R05 Cough: Secondary | ICD-10-CM | POA: Diagnosis present

## 2016-05-06 DIAGNOSIS — J4521 Mild intermittent asthma with (acute) exacerbation: Secondary | ICD-10-CM | POA: Diagnosis not present

## 2016-05-06 DIAGNOSIS — B9789 Other viral agents as the cause of diseases classified elsewhere: Secondary | ICD-10-CM

## 2016-05-06 DIAGNOSIS — J069 Acute upper respiratory infection, unspecified: Secondary | ICD-10-CM

## 2016-05-06 DIAGNOSIS — F17210 Nicotine dependence, cigarettes, uncomplicated: Secondary | ICD-10-CM | POA: Diagnosis not present

## 2016-05-06 MED ORDER — AEROCHAMBER PLUS FLO-VU SMALL MISC
1.0000 | Freq: Once | Status: AC
  Administered 2016-05-06: 1
  Filled 2016-05-06: qty 1

## 2016-05-06 MED ORDER — IPRATROPIUM-ALBUTEROL 0.5-2.5 (3) MG/3ML IN SOLN
3.0000 mL | Freq: Once | RESPIRATORY_TRACT | Status: AC
  Administered 2016-05-06: 3 mL via RESPIRATORY_TRACT
  Filled 2016-05-06: qty 3

## 2016-05-06 MED ORDER — ALBUTEROL SULFATE HFA 108 (90 BASE) MCG/ACT IN AERS: 1.0000 | INHALATION_SPRAY | Freq: Four times a day (QID) | RESPIRATORY_TRACT | 0 refills | Status: DC | PRN

## 2016-05-06 MED ORDER — PREDNISONE 20 MG PO TABS
60.0000 mg | ORAL_TABLET | Freq: Once | ORAL | Status: AC
  Administered 2016-05-06: 60 mg via ORAL
  Filled 2016-05-06: qty 3

## 2016-05-06 MED ORDER — ALBUTEROL SULFATE (2.5 MG/3ML) 0.083% IN NEBU
5.0000 mg | INHALATION_SOLUTION | Freq: Once | RESPIRATORY_TRACT | Status: AC
  Administered 2016-05-06: 5 mg via RESPIRATORY_TRACT
  Filled 2016-05-06: qty 6

## 2016-05-06 MED ORDER — ALBUTEROL SULFATE HFA 108 (90 BASE) MCG/ACT IN AERS
2.0000 | INHALATION_SPRAY | Freq: Once | RESPIRATORY_TRACT | Status: AC
  Administered 2016-05-06: 2 via RESPIRATORY_TRACT
  Filled 2016-05-06: qty 6.7

## 2016-05-06 MED ORDER — PREDNISONE 20 MG PO TABS: ORAL_TABLET | ORAL | 0 refills | Status: DC

## 2016-05-06 NOTE — Discharge Instructions (Signed)
You were seen today with asthma. Please take your inhalers as prescribed. Also please take the prednisone for the next couple days to help with the inflammation and asthma. If you wheezing increases please return to the emergency department. You need a primary care physician. We will give you a list of numbers of primary care physicians in the area.  To find a primary care or specialty doctor please call [phone] or [phone] to access "[facility] Find a Doctor Service."  You may also go on the [facility] website at [email or web address]  There are also multiple [facility] practices throughout the [place] that are frequently accepting new patients. You may find a clinic that is close to your home and contact them.  [facility] -  [address] [place] [phone]  [facility] in [place] (also locations in [place] and [place]) -  [address] [place] [address] [phone]  [facility] -  [address] [place] [address] [phone]

## 2016-05-06 NOTE — ED Notes (Signed)
Gave pt a sprite per Brittany-RN.

## 2016-05-06 NOTE — ED Triage Notes (Signed)
Pt reports to the ED for eval of productive, clear cough x 7 days. Pt report a hx of asthma but does not have any inhalers or nebulizer at home to use. Pt denies any N/V/D, fevers, or chills. Wheezing noted.

## 2016-05-06 NOTE — ED Notes (Signed)
Wheezing improved patient states he feels better.

## 2016-05-06 NOTE — ED Provider Notes (Signed)
[facility]-EMERGENCY DEPT Provider Note   CSN: [id number] Arrival date & time: 05/06/16  1320  By signing my name below, I, Soijett Blue, attest that this documentation has been prepared under the direction and in the presence of Beauden Tremont Lyn Morgan Rennert, MD. Electronically Signed: Soijett Blue, ED Scribe. 05/06/16. 1:57 PM.  History   Chief Complaint Chief Complaint  Patient presents with  . Cough    HPI Warnell ForesterKevin Covell is a 35 y.o. male with a PMHx of asthma, bipolar 1 disorder, schizophrenia, depression, who presents to the Emergency Department complaining of productive cough x clear sputum onset 1 week ago. Pt states that he has a hx of asthma, but denies having an inhaler due to not having a PCP at this time. He states that he is having associated symptoms of wheezing. He states that he has not tried any medications for the relief of his symptoms. He denies rhinorrhea, fever, chills, and any other symptoms. Denies allergies to medications.   The history is provided by the patient. No language interpreter was used.    Past Medical History:  Diagnosis Date  . Asthma   . Bipolar 1 disorder (HCC)   . Depression   . Schizophrenia [facility](HCC)     Patient Active Problem List   Diagnosis Date Noted  . Benzodiazepine abuse in remission 01/09/2016  . Cannabis use disorder, severe, dependence (HCC) 08/17/2014  . Schizoaffective disorder, bipolar type (HCC) 08/16/2014    History reviewed. No pertinent surgical history.     Home Medications    Prior to Admission medications   Medication Sig Start Date End Date Taking? Authorizing Provider  benztropine (COGENTIN) 1 MG tablet Take 1 tablet (1 mg total) by mouth daily. 01/12/16   Adonis BrookSheila Agustin, NP  gabapentin (NEURONTIN) 300 MG capsule Take 1 capsule (300 mg total) by mouth 2 (two) times daily. 01/12/16   Adonis BrookSheila Agustin, NP  lamoTRIgine (LAMICTAL) 25 MG tablet Take 2 tablets (50 mg total) by mouth daily. 01/12/16   Adonis BrookSheila Agustin, NP  nicotine  (NICODERM CQ - DOSED IN MG/24 HOURS) 21 mg/24hr patch Place 1 patch (21 mg total) onto the skin daily. 01/12/16   Adonis BrookSheila Agustin, NP  paliperidone (INVEGA) 6 MG 24 hr tablet Take 1 tablet (6 mg total) by mouth daily. 01/12/16   Adonis BrookSheila Agustin, NP  traZODone (DESYREL) 50 MG tablet Take 1 tablet (50 mg total) by mouth at bedtime and may repeat dose one time if needed. 01/12/16   Adonis BrookSheila Agustin, NP    Family History Family History  Problem Relation Age of Onset  . Asthma Mother   . Alcoholism Other     Social History Social History  Substance Use Topics  . Smoking status: Current Some Day Smoker    Packs/day: 1.00    Types: Cigarettes  . Smokeless tobacco: Never Used  . Alcohol use Yes     Allergies   Salami [pickled meat]   Review of Systems Review of Systems  Constitutional: Negative for chills and fever.  HENT: Negative for rhinorrhea.   Respiratory: Positive for cough (productive, clear sputum) and wheezing.      Physical Exam Updated Vital Signs BP 101/67   Pulse 104   Temp 98.4 F (36.9 C) (Oral)   Resp 17   SpO2 99%   Physical Exam  Constitutional: He is oriented to person, place, and time. He appears well-developed and well-nourished. No distress.  HENT:  Head: Normocephalic and atraumatic.  Mouth/Throat: No posterior oropharyngeal erythema.  Eyes: EOM are  normal.  Neck: Neck supple.  Cardiovascular: Normal rate, regular rhythm and normal heart sounds.  Exam reveals no gallop and no friction rub.   No murmur heard. Pulmonary/Chest: Effort normal. No respiratory distress. He has wheezes. He has no rales.  Diffuse wheezing throughout  Abdominal: Soft. He exhibits no distension. There is no tenderness.  Musculoskeletal: Normal range of motion.  Neurological: He is alert and oriented to person, place, and time.  Skin: Skin is warm and dry.  Psychiatric: He has a normal mood and affect. His behavior is normal.  Nursing note and vitals reviewed.    ED  Treatments / Results  DIAGNOSTIC STUDIES: Oxygen Saturation is 99% on RA, nl by my interpretation.    COORDINATION OF CARE: 1:56 PM Discussed treatment plan with pt at bedside which includes breathing treatment, CXR, inhaler, and pt agreed to plan.   Radiology Dg Chest 2 View  Result Date: 05/06/2016 CLINICAL DATA:  Asthma and dyspnea.  Wheezing for a week. EXAM: CHEST  2 VIEW COMPARISON:  01/08/2016 FINDINGS: The heart size and mediastinal contours are within normal limits. Both lungs are clear. The visualized skeletal structures are unremarkable. IMPRESSION: No active cardiopulmonary disease. Electronically Signed   By: Tollie Ethavid  Kwon M.D.   On: 05/06/2016 14:18    Procedures Procedures (including critical care time)  Medications Ordered in ED Medications - No data to display   Initial Impression / Assessment and Plan / ED Course  I have reviewed the triage vital signs and the nursing notes.  Pertinent imaging results that were available during my care of the patient were reviewed by me and considered in my medical decision making (see chart for details).  Clinical Course     I personally performed the services described in this documentation, which was scribed in my presence. The recorded information has been reviewed and is accurate.   35 year old male presenting with cough. Patient not been taking any of his inhalers as prescribed. Patient has history of asthma. This is been going on for 3-4 days. Patient has extensive diffuse wheezing on exam. We'll give albuterol treatment, and prednisone. Patient is not tachypnic nor hypoxic therefore suspect the patient will be able to be discharged home. We'll give patient inhaler to bring home with him until he can follow up with her primary care physician.  5:05 PM After two duonebs, improved. 100% onxygenation on walking. Scattered wheezes, no tachypnea, appears well.   Final Clinical Impressions(s) / ED Diagnoses   Final diagnoses:    None    New Prescriptions New Prescriptions   No medications on file     Nate Perri Randall AnLyn Chivas Notz, MD 05/06/16 1706

## 2016-10-07 ENCOUNTER — Encounter (HOSPITAL_COMMUNITY): Payer: Self-pay | Admitting: Emergency Medicine

## 2016-10-07 ENCOUNTER — Emergency Department (HOSPITAL_COMMUNITY)
Admission: EM | Admit: 2016-10-07 | Discharge: 2016-10-07 | Disposition: A | Discharge status: Home / Self Care | Payer: Medicaid Other | Attending: Emergency Medicine | Admitting: Emergency Medicine

## 2016-10-07 ENCOUNTER — Emergency Department (HOSPITAL_COMMUNITY): Payer: Medicaid Other

## 2016-10-07 DIAGNOSIS — F17210 Nicotine dependence, cigarettes, uncomplicated: Secondary | ICD-10-CM | POA: Diagnosis not present

## 2016-10-07 DIAGNOSIS — E876 Hypokalemia: Secondary | ICD-10-CM | POA: Insufficient documentation

## 2016-10-07 DIAGNOSIS — J45909 Unspecified asthma, uncomplicated: Secondary | ICD-10-CM | POA: Diagnosis not present

## 2016-10-07 DIAGNOSIS — R059 Cough, unspecified: Secondary | ICD-10-CM

## 2016-10-07 DIAGNOSIS — R05 Cough: Secondary | ICD-10-CM

## 2016-10-07 DIAGNOSIS — Z79899 Other long term (current) drug therapy: Secondary | ICD-10-CM | POA: Diagnosis not present

## 2016-10-07 LAB — COMPREHENSIVE METABOLIC PANEL
ALBUMIN: 4.2 g/dL (ref 3.5–5.0)
ALT: 19 U/L (ref 17–63)
ANION GAP: 10 (ref 5–15)
AST: 24 U/L (ref 15–41)
Alkaline Phosphatase: 63 U/L (ref 38–126)
BILIRUBIN TOTAL: 0.6 mg/dL (ref 0.3–1.2)
BUN: 15 mg/dL (ref 6–20)
CHLORIDE: 101 mmol/L (ref 101–111)
CO2: 25 mmol/L (ref 22–32)
Calcium: 9 mg/dL (ref 8.9–10.3)
Creatinine, Ser: 1.04 mg/dL (ref 0.61–1.24)
GFR calc Af Amer: >60 mL/min (ref >60)
GFR calc non Af Amer: >60 mL/min (ref >60)
GLUCOSE: 136 mg/dL — AB (ref 65–99)
POTASSIUM: 2.9 mmol/L — AB (ref 3.5–5.1)
SODIUM: 136 mmol/L (ref 135–145)
TOTAL PROTEIN: 7.2 g/dL (ref 6.5–8.1)

## 2016-10-07 LAB — TROPONIN I

## 2016-10-07 MED ORDER — PREDNISONE 20 MG PO TABS
40.0000 mg | ORAL_TABLET | Freq: Once | ORAL | Status: AC
  Administered 2016-10-07: 40 mg via ORAL
  Filled 2016-10-07: qty 2

## 2016-10-07 MED ORDER — IPRATROPIUM-ALBUTEROL 0.5-2.5 (3) MG/3ML IN SOLN
3.0000 mL | Freq: Once | RESPIRATORY_TRACT | Status: AC
  Administered 2016-10-07: 3 mL via RESPIRATORY_TRACT

## 2016-10-07 MED ORDER — AZITHROMYCIN 250 MG PO TABS: ORAL_TABLET | ORAL | 0 refills | Status: DC

## 2016-10-07 MED ORDER — PREDNISONE 20 MG PO TABS: ORAL_TABLET | ORAL | 0 refills | Status: DC

## 2016-10-07 MED ORDER — IPRATROPIUM-ALBUTEROL 0.5-2.5 (3) MG/3ML IN SOLN
3.0000 mL | RESPIRATORY_TRACT | Status: AC
  Administered 2016-10-07 (×3): 3 mL via RESPIRATORY_TRACT
  Filled 2016-10-07: qty 3
  Filled 2016-10-07: qty 6

## 2016-10-07 MED ORDER — ALBUTEROL SULFATE HFA 108 (90 BASE) MCG/ACT IN AERS
2.0000 | INHALATION_SPRAY | Freq: Once | RESPIRATORY_TRACT | Status: AC
Start: 2016-10-07 — End: 2016-10-07
  Administered 2016-10-07: 2 via RESPIRATORY_TRACT
  Filled 2016-10-07: qty 6.7

## 2016-10-07 MED ORDER — POTASSIUM CHLORIDE CRYS ER 20 MEQ PO TBCR: 40.0000 meq | EXTENDED_RELEASE_TABLET | Freq: Two times a day (BID) | ORAL | 0 refills | Status: DC

## 2016-10-07 MED ORDER — IPRATROPIUM-ALBUTEROL 0.5-2.5 (3) MG/3ML IN SOLN
RESPIRATORY_TRACT | Status: AC
  Filled 2016-10-07: qty 3

## 2016-10-07 MED ORDER — POTASSIUM CHLORIDE CRYS ER 20 MEQ PO TBCR
40.0000 meq | EXTENDED_RELEASE_TABLET | Freq: Once | ORAL | Status: AC
  Administered 2016-10-07: 40 meq via ORAL
  Filled 2016-10-07: qty 2

## 2016-10-07 NOTE — ED Notes (Signed)
ED Provider at bedside. 

## 2016-10-07 NOTE — ED Notes (Signed)
Blood has already been sent to main lab; I-stat will be request as CMP from main lab; Md ok'ed swtich

## 2016-10-07 NOTE — ED Notes (Signed)
Pt appeared to be in distress; pt clenching epigastric area and has visible body shakes; pt c/o of pain at 10/10; vitals taken and pt transferred to E43 from hallway bed E11; EKG done; Dr.Mesner present at bedside

## 2016-10-07 NOTE — ED Provider Notes (Signed)
[facility] DEPT Provider Note   CSN: [id number] Arrival date & time: 10/07/16  1334     History   Chief Complaint Chief Complaint  Patient presents with  . Cough    HPI Ronald Martinez is a 36 y.o. male.   Cough  This is a new problem. The current episode started more than 2 days ago. The problem occurs constantly. The cough is productive of sputum. There has been no fever. Associated symptoms include chest pain. Pertinent negatives include no ear congestion. He has tried nothing for the symptoms.    Past Medical History:  Diagnosis Date  . Asthma   . Bipolar 1 disorder (HCC)   . Depression   . Schizophrenia [facility])     Patient Active Problem List   Diagnosis Date Noted  . Benzodiazepine abuse in remission 01/09/2016  . Cannabis use disorder, severe, dependence (HCC) 08/17/2014  . Schizoaffective disorder, bipolar type (HCC) 08/16/2014    History reviewed. No pertinent surgical history.     Home Medications    Prior to Admission medications   Medication Sig Start Date End Date Taking? Authorizing Provider  albuterol (PROVENTIL HFA;VENTOLIN HFA) 108 (90 Base) MCG/ACT inhaler Inhale 1-2 puffs into the lungs every 6 (six) hours as needed for wheezing or shortness of breath. 05/06/16   Mackuen, Courteney Lyn, MD  azithromycin (ZITHROMAX Z-PAK) 250 MG tablet 2 po day one, then 1 daily x 4 days 10/07/16   Ashmi Blas, Barbara Cower, MD  benztropine (COGENTIN) 1 MG tablet Take 1 tablet (1 mg total) by mouth daily. 01/12/16   Adonis Brook, NP  gabapentin (NEURONTIN) 300 MG capsule Take 1 capsule (300 mg total) by mouth 2 (two) times daily. 01/12/16   Adonis Brook, NP  lamoTRIgine (LAMICTAL) 25 MG tablet Take 2 tablets (50 mg total) by mouth daily. 01/12/16   Adonis Brook, NP  nicotine (NICODERM CQ - DOSED IN MG/24 HOURS) 21 mg/24hr patch Place 1 patch (21 mg total) onto the skin daily. 01/12/16   Adonis Brook, NP  paliperidone (INVEGA) 6 MG 24 hr tablet Take 1 tablet (6 mg total)  by mouth daily. 01/12/16   Adonis Brook, NP  potassium chloride SA (K-DUR,KLOR-CON) 20 MEQ tablet Take 2 tablets (40 mEq total) by mouth 2 (two) times daily. 10/07/16 10/17/16  Baylea Milburn, Barbara Cower, MD  predniSONE (DELTASONE) 20 MG tablet 2 tabs po daily x 4 days 10/07/16   Leta Bucklin, Barbara Cower, MD  traZODone (DESYREL) 50 MG tablet Take 1 tablet (50 mg total) by mouth at bedtime and may repeat dose one time if needed. 01/12/16   Adonis Brook, NP    Family History Family History  Problem Relation Age of Onset  . Asthma Mother   . Alcoholism Other     Social History Social History  Substance Use Topics  . Smoking status: Current Some Day Smoker    Packs/day: 1.00    Types: Cigarettes  . Smokeless tobacco: Never Used  . Alcohol use Yes     Allergies   Salami [pickled meat]   Review of Systems Review of Systems  Respiratory: Positive for cough.   Cardiovascular: Positive for chest pain.  All other systems reviewed and are negative.    Physical Exam Updated Vital Signs BP 114/74   Pulse 91   Temp 98.5 F (36.9 C) (Oral)   Resp (!) 29   Ht 5\' 11"  (1.803 m)   Wt 236 lb 4.8 oz (107.2 kg)   SpO2 97%   BMI 32.96 kg/m   Physical  Exam  Constitutional: He is oriented to person, place, and time. He appears well-developed and well-nourished.  HENT:  Head: Normocephalic and atraumatic.  Eyes: Conjunctivae and EOM are normal.  Neck: Normal range of motion.  Cardiovascular: Normal rate.  Exam reveals no friction rub.   No murmur heard. Pulmonary/Chest: Effort normal. No respiratory distress. He has wheezes.  Abdominal: Soft. He exhibits no distension.  Musculoskeletal: Normal range of motion. He exhibits no edema or deformity.  Neurological: He is alert and oriented to person, place, and time. No cranial nerve deficit.  Skin: Skin is warm and dry.  Nursing note and vitals reviewed.    ED Treatments / Results  Labs (all labs ordered are listed, but only abnormal results are  displayed) Labs Reviewed  COMPREHENSIVE METABOLIC PANEL - Abnormal; Notable for the following:       Result Value   Potassium 2.9 (*)    Glucose, Bld 136 (*)    All other components within normal limits  TROPONIN I    EKG  EKG Interpretation  Date/Time:  Monday Oct 07 2016 20:46:21 EDT Ventricular Rate:  94 PR Interval:    QRS Duration: 84 QT Interval:  355 QTC Calculation: 444 R Axis:   58 Text Interpretation:  Sinus rhythm Consider right atrial enlargement Minimal ST elevation, anterior leads STE not new from august 2017 Confirmed by [facility] Psych[facility] CenterMESNER MD, Barbara CowerJASON [phone]([id number]) on 10/07/2016 10:10:09 PM       Radiology Dg Chest 2 View  Result Date: 10/07/2016 CLINICAL DATA:  Shortness of breath and cough for 2 weeks. EXAM: CHEST  2 VIEW COMPARISON:  May 06, 2016 FINDINGS: The heart size and mediastinal contours are within normal limits. Both lungs are clear. The visualized skeletal structures are unremarkable. IMPRESSION: No active cardiopulmonary disease. Electronically Signed   By: Sherian ReinWei-Chen  Lin M.D.   On: 10/07/2016 15:24    Procedures Procedures (including critical care time)  Medications Ordered in ED Medications  potassium chloride SA (K-DUR,KLOR-CON) CR tablet 40 mEq (not administered)  albuterol (PROVENTIL HFA;VENTOLIN HFA) 108 (90 Base) MCG/ACT inhaler 2 puff (not administered)  ipratropium-albuterol (DUONEB) 0.5-2.5 (3) MG/3ML nebulizer solution 3 mL (3 mLs Nebulization Given 10/07/16 1413)  ipratropium-albuterol (DUONEB) 0.5-2.5 (3) MG/3ML nebulizer solution 3 mL (3 mLs Nebulization Given 10/07/16 2008)  predniSONE (DELTASONE) tablet 40 mg (40 mg Oral Given 10/07/16 1944)     Initial Impression / Assessment and Plan / ED Course  I have reviewed the triage vital signs and the nursing notes.  Pertinent labs & imaging results that were available during my care of the patient were reviewed by me and considered in my medical decision making (see chart for details).     Likely asthma  exacerbation, improved in ER. Had and episode of luq pressure, ecg and troponin ok. Labs unremarkable. Likely related to palpitations from albuterol. Stable for dc on K/prednisone/abx/albuterol. Will need pcp follow up to eval for improving potassium.   Final Clinical Impressions(s) / ED Diagnoses   Final diagnoses:  Cough  Hypokalemia    New Prescriptions New Prescriptions   AZITHROMYCIN (ZITHROMAX Z-PAK) 250 MG TABLET    2 po day one, then 1 daily x 4 days   POTASSIUM CHLORIDE SA (K-DUR,KLOR-CON) 20 MEQ TABLET    Take 2 tablets (40 mEq total) by mouth 2 (two) times daily.   PREDNISONE (DELTASONE) 20 MG TABLET    2 tabs po daily x 4 days     Marily MemosMesner, Teasia Zapf, MD 10/07/16 2223

## 2016-10-07 NOTE — ED Triage Notes (Signed)
Pt to ER for cough with clear sputum production x1 week. Hx of asthma. Inspiratory and expiratory wheezing noted. A/o x4. NAD. Respirations equal and unlabored.

## 2016-12-07 ENCOUNTER — Emergency Department (HOSPITAL_COMMUNITY): Payer: Medicaid Other

## 2016-12-07 ENCOUNTER — Encounter (HOSPITAL_COMMUNITY): Payer: Self-pay

## 2016-12-07 ENCOUNTER — Emergency Department (HOSPITAL_COMMUNITY)
Admission: EM | Admit: 2016-12-07 | Discharge: 2016-12-09 | Disposition: A | Discharge status: Home / Self Care | Payer: Medicaid Other | Attending: Emergency Medicine | Admitting: Emergency Medicine

## 2016-12-07 DIAGNOSIS — Z79899 Other long term (current) drug therapy: Secondary | ICD-10-CM | POA: Insufficient documentation

## 2016-12-07 DIAGNOSIS — F250 Schizoaffective disorder, bipolar type: Secondary | ICD-10-CM | POA: Insufficient documentation

## 2016-12-07 DIAGNOSIS — F1220 Cannabis dependence, uncomplicated: Secondary | ICD-10-CM | POA: Insufficient documentation

## 2016-12-07 DIAGNOSIS — J45909 Unspecified asthma, uncomplicated: Secondary | ICD-10-CM | POA: Insufficient documentation

## 2016-12-07 DIAGNOSIS — F17210 Nicotine dependence, cigarettes, uncomplicated: Secondary | ICD-10-CM | POA: Insufficient documentation

## 2016-12-07 LAB — CBC
HCT: 40.8 % (ref 39.0–52.0)
HEMOGLOBIN: 13.8 g/dL (ref 13.0–17.0)
MCH: 27.2 pg (ref 26.0–34.0)
MCHC: 33.8 g/dL (ref 30.0–36.0)
MCV: 80.3 fL (ref 78.0–100.0)
PLATELETS: 263 10*3/uL (ref 150–400)
RBC: 5.08 MIL/uL (ref 4.22–5.81)
RDW: 14.9 % (ref 11.5–15.5)
WBC: 6.2 10*3/uL (ref 4.0–10.5)

## 2016-12-07 LAB — ETHANOL

## 2016-12-07 LAB — RAPID URINE DRUG SCREEN, HOSP PERFORMED
Amphetamines: NOT DETECTED
Barbiturates: NOT DETECTED
Benzodiazepines: NOT DETECTED
COCAINE: NOT DETECTED
OPIATES: NOT DETECTED
TETRAHYDROCANNABINOL: POSITIVE — AB

## 2016-12-07 LAB — COMPREHENSIVE METABOLIC PANEL
ALT: 31 U/L (ref 17–63)
ANION GAP: 5 (ref 5–15)
AST: 31 U/L (ref 15–41)
Albumin: 4.3 g/dL (ref 3.5–5.0)
Alkaline Phosphatase: 61 U/L (ref 38–126)
BUN: 10 mg/dL (ref 6–20)
CALCIUM: 9.4 mg/dL (ref 8.9–10.3)
CHLORIDE: 105 mmol/L (ref 101–111)
CO2: 27 mmol/L (ref 22–32)
Creatinine, Ser: 1.04 mg/dL (ref 0.61–1.24)
Glucose, Bld: 76 mg/dL (ref 65–99)
Potassium: 3.3 mmol/L — ABNORMAL LOW (ref 3.5–5.1)
SODIUM: 137 mmol/L (ref 135–145)
Total Bilirubin: 0.7 mg/dL (ref 0.3–1.2)
Total Protein: 7 g/dL (ref 6.5–8.1)

## 2016-12-07 LAB — SALICYLATE LEVEL

## 2016-12-07 LAB — ACETAMINOPHEN LEVEL

## 2016-12-07 MED ORDER — LAMOTRIGINE 25 MG PO TABS
50.0000 mg | ORAL_TABLET | Freq: Every day | ORAL | Status: DC
  Administered 2016-12-07 – 2016-12-08 (×2): 50 mg via ORAL
  Filled 2016-12-07 (×2): qty 2

## 2016-12-07 MED ORDER — ALBUTEROL SULFATE HFA 108 (90 BASE) MCG/ACT IN AERS: 1.0000 | INHALATION_SPRAY | Freq: Four times a day (QID) | RESPIRATORY_TRACT | Status: DC | PRN

## 2016-12-07 MED ORDER — GABAPENTIN 300 MG PO CAPS
300.0000 mg | ORAL_CAPSULE | Freq: Two times a day (BID) | ORAL | Status: DC
  Administered 2016-12-07 – 2016-12-09 (×4): 300 mg via ORAL
  Filled 2016-12-07 (×4): qty 1

## 2016-12-07 MED ORDER — IPRATROPIUM-ALBUTEROL 0.5-2.5 (3) MG/3ML IN SOLN
3.0000 mL | Freq: Once | RESPIRATORY_TRACT | Status: AC
  Administered 2016-12-07: 3 mL via RESPIRATORY_TRACT
  Filled 2016-12-07: qty 3

## 2016-12-07 MED ORDER — TRAZODONE HCL 50 MG PO TABS
50.0000 mg | ORAL_TABLET | Freq: Every evening | ORAL | Status: DC | PRN
  Administered 2016-12-07 – 2016-12-08 (×2): 50 mg via ORAL
  Filled 2016-12-07 (×2): qty 1

## 2016-12-07 NOTE — Consult Note (Signed)
  Pt's chart reviewed by this writer after TTS ran Pt by me for disposition. Pt stated to TTS he has not taken his home psych meds in one week.This writer would recommend restarting home medications.   Lamictal 25 mg once daily for mood stabilization Cogentin 1 mg once daily for EPS Gabapentin 300 mg BID for anxiety Trazadone 50 mg QHS and may repeat dose one time if needed  For sleep   Laveda AbbeLaurie Britton Cristianna Cyr, NP-C 12/07/2016   1559

## 2016-12-07 NOTE — ED Triage Notes (Signed)
Pt reports suicidal ideation with plan to jump off a bridge "ive been feeling this way for the past couple weeks." Reports he smokes marijuana, denies alcohol abuse. He states he is only taking one of his medications for Bipolar because none of the other meds help. Denies recent suicide attempt.

## 2016-12-07 NOTE — ED Provider Notes (Signed)
[facility] Provider Note   CSN: [id number] Arrival date & time: 12/07/16  1140     History   Chief Complaint Chief Complaint  Patient presents with  . Suicidal    HPI Ronald Martinez is a 36 y.o. male with of schizoaffective disorder, Bipolar disorder, and depression who presents to the emergency department with suicidal ideation and plan, which includes jumping off of a building. No HI. He also reports 2 weeks of decreased sleep, low mood, and intermittent auditory hallucinations of a woman's voice who tells him that he is worthless. No visual hallucinations. He states that he came to the emergency department because when he felt like this in the past he has required inpatient treatment.  He reports a history of previous suicidal plan in 2005 when he tried to overdose on Seroquel. No attempts since then, but he reports a long-standing history of suicidal ideation.   He reports that he is supposed to take trazodone, well U-turn, gabapentin, Vistaril, and Lamictal; however, he states that he has not taken any of his medication for the last week. Previously, he was compliant with all of his medications per patient.  He reports previous inpatient behavioral health treatments including [facility]   Patient endorses shortness of breath and cough, but denies denies chest pain, abdominal pain, N/V/D, and all her other medical complaints at this time.  Past medical history includes asthma.   He states that he lives alone. He does not currently work. He is on disability. He reports that he smokes tobacco and marijuana. Denies alcohol use. No other recreational or IV drug use.  The history is provided by the patient. No language interpreter was used.    Past Medical History:  Diagnosis Date  . Asthma   . Bipolar 1 disorder (HCC)   . Depression   . Schizophrenia [facility](HCC)     Patient Active Problem List   Diagnosis Date Noted  . Benzodiazepine abuse  in remission 01/09/2016  . Cannabis use disorder, severe, dependence (HCC) 08/17/2014  . Schizoaffective disorder, bipolar type (HCC) 08/16/2014    History reviewed. No pertinent surgical history.     Home Medications    Prior to Admission medications   Medication Sig Start Date End Date Taking? Authorizing Provider  benztropine (COGENTIN) 1 MG tablet Take 1 tablet (1 mg total) by mouth daily. 01/12/16  Yes Adonis BrookAgustin, Sheila, NP  gabapentin (NEURONTIN) 300 MG capsule Take 1 capsule (300 mg total) by mouth 2 (two) times daily. 01/12/16  Yes Adonis BrookAgustin, Sheila, NP  lamoTRIgine (LAMICTAL) 25 MG tablet Take 2 tablets (50 mg total) by mouth daily. 01/12/16  Yes Adonis BrookAgustin, Sheila, NP  Paliperidone Palmitate (INVEGA SUSTENNA IM) Inject into the muscle every 30 (thirty) days.   Yes [provider]  traZODone (DESYREL) 50 MG tablet Take 1 tablet (50 mg total) by mouth at bedtime and may repeat dose one time if needed. 01/12/16  Yes Adonis BrookAgustin, Sheila, NP  albuterol (PROVENTIL HFA;VENTOLIN HFA) 108 (90 Base) MCG/ACT inhaler Inhale 1-2 puffs into the lungs every 6 (six) hours as needed for wheezing or shortness of breath. 05/06/16   Mackuen, Courteney Lyn, MD  azithromycin (ZITHROMAX Z-PAK) 250 MG tablet 2 po day one, then 1 daily x 4 days Patient not taking: Reported on 12/07/2016 10/07/16   Mesner, Barbara CowerJason, MD  nicotine (NICODERM CQ - DOSED IN MG/24 HOURS) 21 mg/24hr patch Place 1 patch (21 mg total) onto the skin daily. Patient not  taking: Reported on 12/07/2016 01/12/16   Adonis Brook, NP  paliperidone (INVEGA) 6 MG 24 hr tablet Take 1 tablet (6 mg total) by mouth daily. Patient not taking: Reported on 12/07/2016 01/12/16   Adonis Brook, NP  potassium chloride SA (K-DUR,KLOR-CON) 20 MEQ tablet Take 2 tablets (40 mEq total) by mouth 2 (two) times daily. 10/07/16 12/07/16  Mesner, Barbara Cower, MD  predniSONE (DELTASONE) 20 MG tablet 2 tabs po daily x 4 days Patient not taking: Reported on 12/07/2016 10/07/16   Mesner,  Barbara Cower, MD    Family History Family History  Problem Relation Age of Onset  . Asthma Mother   . Alcoholism Other     Social History Social History  Substance Use Topics  . Smoking status: Current Some Day Smoker    Packs/day: 1.00    Types: Cigarettes  . Smokeless tobacco: Never Used  . Alcohol use Yes     Allergies   Salami [pickled meat]   Review of Systems Review of Systems  Constitutional: Negative for activity change.  Respiratory: Positive for cough and shortness of breath.   Cardiovascular: Negative for chest pain.  Gastrointestinal: Negative for abdominal pain.  Musculoskeletal: Negative for back pain.  Skin: Negative for rash.  Neurological: Negative for headaches.  Psychiatric/Behavioral: Positive for dysphoric mood, hallucinations, sleep disturbance and suicidal ideas. Negative for agitation. The patient is not hyperactive.    Physical Exam Updated Vital Signs BP 115/63 (BP Location: Left Arm)   Pulse (!) 102   Temp 99.8 F (37.7 C) (Oral)   Resp 16   SpO2 100%   Physical Exam  Constitutional: He appears well-developed. No distress.  HENT:  Head: Normocephalic and atraumatic.  Eyes: Conjunctivae are normal.  Neck: Neck supple.  Cardiovascular: Normal rate, regular rhythm, normal heart sounds and intact distal pulses.  Exam reveals no gallop and no friction rub.   No murmur heard. Pulmonary/Chest: Effort normal. No respiratory distress. He has wheezes. He has no rales.  Diffuse bilateral inspiratory and expiratory wheezes throughout all fields.  Abdominal: Soft. Bowel sounds are normal. He exhibits no distension. There is no tenderness. There is no rebound and no guarding.  Neurological: He is alert.  Skin: Skin is warm and dry.  Psychiatric: His behavior is normal. His affect is blunt. His speech is delayed. He is actively hallucinating. Thought content is not paranoid and not delusional. He exhibits a depressed mood. He expresses suicidal ideation.  He expresses no homicidal ideation. He expresses suicidal plans. He expresses no homicidal plans.  Nursing note and vitals reviewed.    ED Treatments / Results  Labs (all labs ordered are listed, but only abnormal results are displayed) Labs Reviewed  COMPREHENSIVE METABOLIC PANEL - Abnormal; Notable for the following:       Result Value   Potassium 3.3 (*)    All other components within normal limits  ACETAMINOPHEN LEVEL - Abnormal; Notable for the following:    Acetaminophen (Tylenol), Serum <10 (*)    All other components within normal limits  RAPID URINE DRUG SCREEN, HOSP PERFORMED - Abnormal; Notable for the following:    Tetrahydrocannabinol POSITIVE (*)    All other components within normal limits  ETHANOL  SALICYLATE LEVEL  CBC    EKG  EKG Interpretation None       Radiology Dg Chest Portable 1 View  Result Date: 12/07/2016 CLINICAL DATA:  Coughing and wheezing. EXAM: PORTABLE CHEST 1 VIEW COMPARISON:  10/07/2016 FINDINGS: The lungs are clear without focal pneumonia, edema,  pneumothorax or pleural effusion. The cardiopericardial silhouette is within normal limits for size. The visualized bony structures of the thorax are intact. IMPRESSION: No active disease. Electronically Signed   By: Kennith Center M.D.   On: 12/07/2016 15:25    Procedures Procedures (including critical care time)  Medications Ordered in ED Medications  albuterol (PROVENTIL HFA;VENTOLIN HFA) 108 (90 Base) MCG/ACT inhaler 1-2 puff (not administered)  gabapentin (NEURONTIN) capsule 300 mg (not administered)  lamoTRIgine (LAMICTAL) tablet 50 mg (not administered)  traZODone (DESYREL) tablet 50 mg (not administered)  ipratropium-albuterol (DUONEB) 0.5-2.5 (3) MG/3ML nebulizer solution 3 mL (3 mLs Nebulization Given 12/07/16 1527)     Initial Impression / Assessment and Plan / ED Course  I have reviewed the triage vital signs and the nursing notes.  Pertinent labs & imaging results that were  available during my care of the patient were reviewed by me and considered in my medical decision making (see chart for details).     Patient with a history of schizoaffective disorder, bipolar disorder, and depression presenting with suicidal ideation and suicidal plan. On exam, delayed speech, depressed mood, and blunt affect is noted. The patient also presents with bilateral inspiratory and expiratory wheezes. CXR is negative for active disease. SaO2 at 98% on room air with improved breathing after nebulizer treatment. At this time, the patient is medically cleared. Consulted TTS who recommended inpatient admission pending bed availability.   Final Clinical Impressions(s) / ED Diagnoses   Final diagnoses:  None    New Prescriptions New Prescriptions   No medications on file     Barkley Boards, PA-C 12/07/16 1617    Cardama, Amadeo Garnet, MD 12/07/16 1747

## 2016-12-07 NOTE — [facility] Counselor (Signed)
Contacted the Patient's Publishing rights managerACTT Team Staff, Marylene Landngela [phone].  Ms. Marylene Landngela reports an awareness that the Patient was not medication compliant 2 days ago during a home visit.  She reports a trigger for the Patient to stop taking prescribed medication is stress over financial issues.  Discussed the possibility of the Patient having a Payee to manage SSDI monies since he is unable to manage and it continues to be a trigger for not taking prescribed psychotic medication.  Ms. Marylene Landngela was also informed of the Patient's request to engage in substance use treatment.  She reports their agency has outpatient substance use group.  Ms. Marylene Landngela reports she will inform the Treatment Team on Monday of the Patient's request to participate in substance use treatment and getting a Payee to manage SSDI monies.  Ms. Marylene Landngela reports she is working on faxing the Patient[facility].

## 2016-12-07 NOTE — ED Notes (Signed)
Staffing has been called for a sitter. Security notified to wand patient. Pt has been changed into purple scrubs, belongings placed in one belongings bag.

## 2016-12-07 NOTE — BH Assessment (Signed)
Assessment Note  Ronald Martinez is an 36 y.o. male who asked a Friend to bring him to [facility].  Patient reports experiencing SI with a plan to just off a bridge because of persecutory AH of a male voice stating to "kill myself."  Patient also reports not sleeping for 2 weeks, not eating for 1 week, and not taking prescribed psychotropic medications for 1 week.  Patient presented orientated x4, mood "depressed, affect congruent with mood, and flat, constricted, and he was struttering.  Patient reports current SI with no plan, because "I feel safe here" and reports a SI attempt in 2005 of OD on prescribed Seroquel.  He denied current HI and AVH.  Patient reports he stopped taking prescribed medication because it was not beneficial.  Patient has an ACTT from [facility] who visited him 2 days ago, however he did not tell them about not being medication adherent.  Patient was hospitalized at [facility] 3x in 2016 and 1x in 2017 for SI, HI, non-medication compliant, and Schizoaffective Disorder.  Patient reports smoking Cannabis 2x every 2 weeks of 1 joint per occassion.  Patient reports needing substance use treatment.    Patient meets inpatient criteria and placement will be sought.        Diagnosis: Schizoaffective Disorder, Bipolar type, Cannabis Use Disorder, moderate  Past Medical History:  Past Medical History:  Diagnosis Date  . Asthma   . Bipolar 1 disorder (HCC)   . Depression   . Schizophrenia (HCC)     History reviewed. No pertinent surgical history.  Family History:  Family History  Problem Relation Age of Onset  . Asthma Mother   . Alcoholism Other     Social History:  reports that he has been smoking Cigarettes.  He has been smoking about 1.00 pack per day. He has never used smokeless tobacco. He reports that he drinks alcohol. He reports that he uses drugs, including Marijuana and Benzodiazepines.  Additional Social History:  Alcohol / Drug Use Pain Medications: None Substance #1 Name of  Substance 1: Cannabis 1 - Age of First Use: Unknown 1 - Amount (size/oz): Joint 1 - Frequency: 2x every 2 weeks 1 - Last Use / Amount: 1 joint  CIWA: CIWA-Ar BP: 115/63 Pulse Rate: (!) 102 COWS:    Allergies:  Allergies  Allergen Reactions  . Neoma Laming Meat] Nausea And Vomiting    Allergic to bologna and salami    Home Medications:  (Not in a hospital admission)  OB/GYN Status:  No LMP for male patient.  General Assessment Data Location of Assessment: [facility] ED TTS Assessment: In system Is this a Tele or Face-to-Face Assessment?: Tele Assessment Is this an Initial Assessment or a Re-assessment for this encounter?: Initial Assessment Marital status: Single Maiden name: N/A Is patient pregnant?: No Pregnancy Status: No Living Arrangements: Alone Can pt return to current living arrangement?: Yes Admission Status: Voluntary Is patient capable of signing voluntary admission?: Yes Referral Source: Self/Family/Friend Insurance type: Medicaid, [place]  Medical Screening Exam [facility] Walk-in ONLY) Medical Exam completed: Yes  Crisis Care Plan Living Arrangements: Alone Legal Guardian: Other: (Self) Name of Psychiatrist: [facility] Name of Therapist: [facility] (ACT Team)  Education Status Is patient currently in school?: No Current Grade: N/A Highest grade of school patient has completed: 11th - GED Name of school: N/A Contact person: N/A  Risk to self with the past 6 months Suicidal Ideation: Yes-Currently Present Has patient been a risk to self within the past 6 months prior to admission? :  Yes Suicidal Intent: No-Not Currently/Within Last 6 Months Has patient had any suicidal intent within the past 6 months prior to admission? : Yes Is patient at risk for suicide?: Yes Suicidal Plan?: No-Not Currently/Within Last 6 Months Has patient had any suicidal plan within the past 6 months prior to admission? : Yes Access to Means: No What has been your use of drugs/alcohol within the  last 12 months?: Cannabis Previous Attempts/Gestures: Yes How many times?: 1 Other Self Harm Risks: None Triggers for Past Attempts: Hallucinations Intentional Self Injurious Behavior: None Family Suicide History: Unknown Recent stressful life event(s): Other (Comment) (presecutory AH) Persecutory voices/beliefs?: Yes Depression: Yes Depression Symptoms: Isolating, Insomnia, Fatigue Substance abuse history and/or treatment for substance abuse?: Yes Suicide prevention information given to non-admitted patients: Not applicable  Risk to Others within the past 6 months Homicidal Ideation: No-Not Currently/Within Last 6 Months Does patient have any lifetime risk of violence toward others beyond the six months prior to admission? : Unknown Thoughts of Harm to Others: No-Not Currently Present/Within Last 6 Months Current Homicidal Intent: No Current Homicidal Plan: No Access to Homicidal Means: No Identified Victim: N/A History of harm to others?: No Assessment of Violence: None Noted Violent Behavior Description: N/A Does patient have access to weapons?: No Criminal Charges Pending?: No Does patient have a court date: No Is patient on probation?: No  Psychosis Hallucinations: Auditory Delusions: None noted  Mental Status Report Appearance/Hygiene: In scrubs Eye Contact: Fair Motor Activity: Unremarkable Speech: Logical/coherent, Other (Comment) (Struttering) Level of Consciousness: Alert Mood: Depressed Affect: Flat, Constricted, Depressed Anxiety Level: Minimal Thought Processes: Coherent Judgement: Impaired Orientation: Person, Place, Time, Situation Obsessive Compulsive Thoughts/Behaviors: None  Cognitive Functioning Concentration: Decreased Memory: Recent Intact, Remote Intact IQ: Average Insight: Poor Impulse Control: Poor Appetite: Poor Weight Loss: 0 Weight Gain: 0 Sleep: Decreased Total Hours of Sleep: 0 Vegetative Symptoms: None  ADLScreening [facility](BHH  Assessment Services) Patient's cognitive ability adequate to safely complete daily activities?: Yes Patient able to express need for assistance with ADLs?: Yes Independently performs ADLs?: Yes (appropriate for developmental age)  Prior Inpatient Therapy Prior Inpatient Therapy: Yes Prior Therapy Dates: 2016, 3x, 2017, 1x [facility] Reason for Treatment: Schizoaffective DO, Bipolar type  Prior Outpatient Therapy Prior Outpatient Therapy: Yes Prior Therapy Dates: Current Prior Therapy Facilty/Provider(s): [facility] ACTT Reason for Treatment: Schizoaffective DO Does patient have an ACCT team?: Yes Does patient have Intensive In-House Services?  : No Does patient have [facility] services? : No Does patient have P4CC services?: No  ADL Screening (condition at time of admission) Patient's cognitive ability adequate to safely complete daily activities?: Yes Is the patient deaf or have difficulty hearing?: No Does the patient have difficulty seeing, even when wearing glasses/contacts?: No Does the patient have difficulty concentrating, remembering, or making decisions?: No Patient able to express need for assistance with ADLs?: Yes Does the patient have difficulty dressing or bathing?: No Independently performs ADLs?: Yes (appropriate for developmental age) Does the patient have difficulty walking or climbing stairs?: No Weakness of Legs: None Weakness of Arms/Hands: None  Home Assistive Devices/Equipment Home Assistive Devices/Equipment: None    Abuse/Neglect Assessment (Assessment to be complete while patient is alone) Physical Abuse: Denies Sexual Abuse: Denies Exploitation of patient/patient's resources: Denies Self-Neglect: Denies Values / Beliefs Cultural Requests During Hospitalization: None Spiritual Requests During Hospitalization: None   Advance Directives (For Healthcare) Does Patient Have a Medical Advance Directive?: No (Patient psychotic)     Additional Information 1:1 In Past 12 Months?:  Yes CIRT Risk: No Elopement Risk: No Does patient have medical clearance?: Yes     Disposition:  Disposition Initial Assessment Completed for this Encounter: Yes Disposition of Patient: Inpatient treatment program Type of inpatient treatment program: Adult  On Site Evaluation by:   Reviewed with Physician:    Dey-Johnson,Evora Schechter 12/07/2016 4:06 PM

## 2016-12-07 NOTE — ED Notes (Signed)
Pt has been wanded and cleared by security.

## 2016-12-08 MED ORDER — CITALOPRAM HYDROBROMIDE 10 MG PO TABS
20.0000 mg | ORAL_TABLET | Freq: Every day | ORAL | Status: DC
  Administered 2016-12-09: 20 mg via ORAL
  Filled 2016-12-08: qty 2

## 2016-12-08 NOTE — ED Notes (Signed)
Pt ambulatory to F-10 w/Sitter and RN. Pt wearing burgundy scrubs and socks. Pt denies SI/HI. States he lives alone in his apartment and is ready to be d/c'd to home. RN spoke w/[place], [facility], re: documentation of pt refusing meds while in ED - pt has not refused meds. Dennard Nipugene advised he will correct documentation. Per Irving BurtonL Parks, NP, [facility] - will change pt's Lamictal to Celexa d/t pt has been noncompliant w/meds at home. Pt continues to meet inpt criteria at this time.

## 2016-12-08 NOTE — ED Notes (Signed)
Pt woke - asked if will be d/c'd today. Advised pt of tx plan - changing Lamictal to Celexa. Also advised [facility] will be re-assessing him tomorrow. Voiced understanding.

## 2016-12-08 NOTE — BHH Counselor (Addendum)
[facility] with complaint of suicidal ideation with plan to jump off a bridge.  Per report, Pt has refused to take his medication.  Today Pt reported that he is no longer suicidal and that he wants to go home.  However, because of recent suicidal ideation with plan and intent to jump off bridge, plus recent non-compliance with medication, recommend continued inpatient.  Per report, Pt began medication on 12/07/16.

## 2016-12-08 NOTE — ED Notes (Signed)
Pt given Malawiturkey sandwich and Sprite as requested for snack. Pt gave dinner order request.

## 2016-12-08 NOTE — Progress Notes (Addendum)
Patient has been recommended inpatient treatment by Elta GuadeloupeLaurie Parks on 12/07/16.  Patient has been referred to the following inpatient treatment facilities: [facility] - has only male adult beds this morning. Writer to follow up through the day. [facility] - per Erskine SquibbJane, referrals to be send for tomorrow.  At capacity: [facility], [facility], [facility], [facility], [facility], [facility], [facility].  Left voicemail with [facility] inquiring about bed status and requested call back, phone number provided.  Patient has adult [address] and [facility], [address] [place], [place], and [facility] [place] don't take this insurance.  CSW in disposition will continue to seek placement.  Melbourne Abtsatia Joshual Terrio, LCSWA Disposition staff 12/08/2016 9:36 AM

## 2016-12-08 NOTE — Progress Notes (Addendum)
[facility] and inquired if patient needed placement. Writer informed that patient still needs bed.  Ronald Martinez, LCSWA Disposition staff 12/08/2016 1:27 PM

## 2016-12-08 NOTE — ED Notes (Signed)
Verified pt's belongings - 1 labeled belongings bag in [place] and yellow slip for valuables w/security w/Inventory Sheet outside of locker.

## 2016-12-09 ENCOUNTER — Inpatient Hospital Stay (HOSPITAL_COMMUNITY)
Admission: AD | Admit: 2016-12-09 | Discharge: 2016-12-13 | DRG: 885 | Disposition: A | Discharge status: Home / Self Care | Payer: Medicaid Other | Source: Intra-hospital | Attending: Psychiatry | Admitting: Psychiatry

## 2016-12-09 ENCOUNTER — Encounter (HOSPITAL_COMMUNITY): Payer: Self-pay

## 2016-12-09 DIAGNOSIS — R45851 Suicidal ideations: Secondary | ICD-10-CM | POA: Diagnosis present

## 2016-12-09 DIAGNOSIS — F250 Schizoaffective disorder, bipolar type: Secondary | ICD-10-CM | POA: Diagnosis not present

## 2016-12-09 DIAGNOSIS — J45909 Unspecified asthma, uncomplicated: Secondary | ICD-10-CM | POA: Diagnosis present

## 2016-12-09 DIAGNOSIS — F17210 Nicotine dependence, cigarettes, uncomplicated: Secondary | ICD-10-CM | POA: Diagnosis not present

## 2016-12-09 DIAGNOSIS — F419 Anxiety disorder, unspecified: Secondary | ICD-10-CM | POA: Diagnosis present

## 2016-12-09 DIAGNOSIS — Z91018 Allergy to other foods: Secondary | ICD-10-CM | POA: Diagnosis not present

## 2016-12-09 DIAGNOSIS — Z79899 Other long term (current) drug therapy: Secondary | ICD-10-CM

## 2016-12-09 DIAGNOSIS — G4700 Insomnia, unspecified: Secondary | ICD-10-CM | POA: Diagnosis present

## 2016-12-09 DIAGNOSIS — Z811 Family history of alcohol abuse and dependence: Secondary | ICD-10-CM | POA: Diagnosis not present

## 2016-12-09 DIAGNOSIS — F1390 Sedative, hypnotic, or anxiolytic use, unspecified, uncomplicated: Secondary | ICD-10-CM | POA: Diagnosis not present

## 2016-12-09 DIAGNOSIS — F259 Schizoaffective disorder, unspecified: Secondary | ICD-10-CM | POA: Diagnosis not present

## 2016-12-09 DIAGNOSIS — F1290 Cannabis use, unspecified, uncomplicated: Secondary | ICD-10-CM | POA: Diagnosis not present

## 2016-12-09 MED ORDER — MAGNESIUM HYDROXIDE 400 MG/5ML PO SUSP: 30.0000 mL | Freq: Every day | ORAL | Status: DC | PRN

## 2016-12-09 MED ORDER — ALBUTEROL SULFATE HFA 108 (90 BASE) MCG/ACT IN AERS: 1.0000 | INHALATION_SPRAY | Freq: Four times a day (QID) | RESPIRATORY_TRACT | Status: DC | PRN

## 2016-12-09 MED ORDER — ACETAMINOPHEN 325 MG PO TABS: 650.0000 mg | ORAL_TABLET | Freq: Four times a day (QID) | ORAL | Status: DC | PRN

## 2016-12-09 MED ORDER — GABAPENTIN 300 MG PO CAPS
300.0000 mg | ORAL_CAPSULE | Freq: Two times a day (BID) | ORAL | Status: DC
  Administered 2016-12-09 – 2016-12-13 (×8): 300 mg via ORAL
  Filled 2016-12-09 (×14): qty 1

## 2016-12-09 MED ORDER — ALUM & MAG HYDROXIDE-SIMETH 200-200-20 MG/5ML PO SUSP: 30.0000 mL | ORAL | Status: DC | PRN

## 2016-12-09 MED ORDER — CITALOPRAM HYDROBROMIDE 20 MG PO TABS
20.0000 mg | ORAL_TABLET | Freq: Every day | ORAL | Status: DC
  Administered 2016-12-10: 20 mg via ORAL
  Filled 2016-12-09 (×4): qty 1

## 2016-12-09 MED ORDER — TRAZODONE HCL 50 MG PO TABS
50.0000 mg | ORAL_TABLET | Freq: Every evening | ORAL | Status: DC | PRN
  Administered 2016-12-09: 50 mg via ORAL
  Filled 2016-12-09: qty 1

## 2016-12-09 MED ORDER — HYDROXYZINE HCL 25 MG PO TABS
25.0000 mg | ORAL_TABLET | Freq: Three times a day (TID) | ORAL | Status: DC | PRN
  Administered 2016-12-09 – 2016-12-11 (×2): 25 mg via ORAL
  Filled 2016-12-09 (×2): qty 1

## 2016-12-09 NOTE — ED Notes (Signed)
Lunch tray at bedside. ?

## 2016-12-09 NOTE — ED Notes (Signed)
Pt sleeping. 

## 2016-12-09 NOTE — Tx Team (Signed)
Initial Treatment Plan 12/09/2016 3:28 PM Warnell ForesterKevin Rahal ZOX:[id number]    PATIENT STRESSORS: Financial difficulties Medication change or noncompliance Substance abuse   PATIENT STRENGTHS: Capable of independent living Communication skills General fund of knowledge Motivation for treatment/growth Physical Health   PATIENT IDENTIFIED PROBLEMS: Depression  Psychosis  Suicidal ideation  "Help me to not have to come back to the hospital again"  "Help with depression"             DISCHARGE CRITERIA:  Improved stabilization in mood, thinking, and/or behavior Motivation to continue treatment in a less acute level of care Verbal commitment to aftercare and medication compliance  PRELIMINARY DISCHARGE PLAN: Outpatient therapy Medication management  PATIENT/FAMILY INVOLVEMENT: This treatment plan has been presented to and reviewed with the patient, Warnell ForesterKevin Nied.  The patient and family have been given the opportunity to ask questions and make suggestions.  Levin BaconHeather V Devory Mckinzie, RN 12/09/2016, 3:28 PM

## 2016-12-09 NOTE — Progress Notes (Signed)
D   Pt spent some time in the dayroom this evening watching TV    His interaction is limited    He appears to be thought blocking and cognitively limited   He is very slow to respond and needs simple explanations  A    Verbal support given  Medications administered and effectiveness monitored   Q 15 min checks R   Pt is safe at present time

## 2016-12-09 NOTE — Progress Notes (Signed)
[facility] Ronald Martinez, [facility], [facility]; Accepting provider is Ferne ReusJustina Okonkwo, NP; Attending provider is Dr. [name]. Patient can arrive anytime. Number for report is [phone].   Magda PaganiniAudrey, RN notified.   Baldo DaubJolan Oddie Bottger MSW, LCSWA CSW Disposition [phone]

## 2016-12-09 NOTE — ED Notes (Signed)
Lunch tray ordered 

## 2016-12-09 NOTE — Progress Notes (Signed)
Caryn BeeKevin is a 36 year old male being [facility] voluntarily [facility].  He was brought into the ED accompanied by a friend for suicidal ideation with plan to jump off bridge and auditory hallucinations of a male telling him to kill himself.  He has a history of a previous suicide attempt in the past by overdosing on Seroquel.  He has a history of multiple hospitalizations in the past.  He is currently on an ACTT team through [facility].  He denied HI or visual hallucinations.  He is diagnosed with Schizoaffective Disorder, Bipolar Type and Cannabis Use disorder.  He continues to hear voices on occasion but it comes and goes.  He reports passive SI but is able to contract for safety on the unit.  He denies any pain or discomfort and appears to be in no physical distress.  Oriented him to the unit.  Admission paperwork completed and signed.  Belongings searched and secured in locker # 40.  Skin assessment completed and noted dry skin and calluses on both feet.  Q 15 minute checks initiated for safety.  We will monitor the progress towards his goals.

## 2016-12-10 DIAGNOSIS — F250 Schizoaffective disorder, bipolar type: Principal | ICD-10-CM

## 2016-12-10 MED ORDER — POTASSIUM CHLORIDE CRYS ER 20 MEQ PO TBCR
40.0000 meq | EXTENDED_RELEASE_TABLET | Freq: Two times a day (BID) | ORAL | Status: DC
  Filled 2016-12-10 (×2): qty 2

## 2016-12-10 MED ORDER — CITALOPRAM HYDROBROMIDE 10 MG PO TABS
10.0000 mg | ORAL_TABLET | Freq: Every day | ORAL | Status: DC
  Administered 2016-12-11 – 2016-12-13 (×3): 10 mg via ORAL
  Filled 2016-12-10 (×5): qty 1

## 2016-12-10 MED ORDER — NICOTINE 21 MG/24HR TD PT24
21.0000 mg | MEDICATED_PATCH | Freq: Every day | TRANSDERMAL | Status: DC
  Filled 2016-12-10 (×5): qty 1

## 2016-12-10 MED ORDER — POTASSIUM CHLORIDE CRYS ER 20 MEQ PO TBCR
20.0000 meq | EXTENDED_RELEASE_TABLET | Freq: Two times a day (BID) | ORAL | Status: AC
  Administered 2016-12-10 – 2016-12-11 (×2): 20 meq via ORAL
  Filled 2016-12-10 (×2): qty 1

## 2016-12-10 MED ORDER — BENZTROPINE MESYLATE 1 MG PO TABS
1.0000 mg | ORAL_TABLET | Freq: Two times a day (BID) | ORAL | Status: DC
  Filled 2016-12-10 (×2): qty 1

## 2016-12-10 MED ORDER — TRAZODONE HCL 100 MG PO TABS
100.0000 mg | ORAL_TABLET | Freq: Every day | ORAL | Status: DC
  Filled 2016-12-10: qty 1

## 2016-12-10 MED ORDER — TRAZODONE HCL 50 MG PO TABS
50.0000 mg | ORAL_TABLET | Freq: Every evening | ORAL | Status: DC | PRN
  Administered 2016-12-11 – 2016-12-12 (×2): 50 mg via ORAL
  Filled 2016-12-10 (×2): qty 1

## 2016-12-10 MED ORDER — TRAZODONE HCL 50 MG PO TABS
50.0000 mg | ORAL_TABLET | Freq: Every day | ORAL | Status: DC
  Filled 2016-12-10: qty 1

## 2016-12-10 MED ORDER — PALIPERIDONE ER 6 MG PO TB24
6.0000 mg | ORAL_TABLET | Freq: Every day | ORAL | Status: DC
  Administered 2016-12-10 – 2016-12-13 (×4): 6 mg via ORAL
  Filled 2016-12-10 (×7): qty 1

## 2016-12-10 MED ORDER — BENZTROPINE MESYLATE 0.5 MG PO TABS
0.5000 mg | ORAL_TABLET | Freq: Two times a day (BID) | ORAL | Status: DC
  Administered 2016-12-10 – 2016-12-13 (×6): 0.5 mg via ORAL
  Filled 2016-12-10 (×10): qty 1

## 2016-12-10 NOTE — BHH Suicide Risk Assessment (Signed)
[facility]   Nursing information obtained from:  Patient Demographic factors:  Male, Unemployed, Living alone Current Mental Status:  Suicidal ideation indicated by patient Loss Factors:  NA Historical Factors:  Prior suicide attempts, Impulsivity Risk Reduction Factors:  Positive therapeutic relationship  Total Time spent with patient: 45 minutes Principal Problem:  Depression Diagnosis:   Patient Active Problem List   Diagnosis Date Noted  . 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 Cannabis use disorder, severe, dependence (HCC) [F12.20] 08/17/2014  . Schizoaffective disorder, bipolar type (HCC) [F25.0] 08/16/2014    Continued Clinical Symptoms:  Alcohol Use Disorder Identification Test Final Score (AUDIT): 0 The "Alcohol Use Disorders Identification Test", Guidelines for Use in Primary Care, Second Edition.  World Science writerHealth Organization [facility](WHO). Score between 0-7:  no or low risk or alcohol related problems. Score between 8-15:  moderate risk of alcohol related problems. Score between 16-19:  high risk of alcohol related problems. Score 20 or above:  warrants further diagnostic evaluation for alcohol dependence and treatment.   CLINICAL FACTORS:  36 year old male, single, no children, lives alone, on disability. Presented to hospital voluntarily due to worsening depression , suicidal ideations of jumping off a bridge . States he has been feeling depressed for " a couple of weeks".  Of note, patient has history of psychosis during past decompensations but denies any psychotic symptoms at this time. Attributes depression at least partly to insomnia. States " I was just pretty upset I could not sleep". States " I think that the anniversary of a friend's death is also making me depressed" Patient reports using cannabis irregularly. Denies drug or alcohol abuse . UDS positive for cannabis, BAL <5.  Patient has a history of chronic psychiatric  illness, has been diagnosed with Schizoaffective Disorder. He has had several prior psychiatric admissions and was admitted to our inpatient psychiatric unit in August of 2017. At the time was admitted due to depression,suicidal ideations and psychotic symptoms.  Patient is currently in treatment with [facility] ACT team. As per chart , he is on INVEGA TRINZA 819 mgrs Q 3 months. States " I really can't remember the last time I got it". He had not been taking PO INVEGA recently. He reports he recently started [facility] XL 150 mgrs QDAY for smoking cessation, states he has been taking only for a few days  Denies medical illnesses other than seasonal allergies , asthma. NKDA  Dx- Schizoaffective Disorder, Depressed   Plan- inpatient admission. PO Invega was restarted at 6 mgrs QDAY and if patient provides consent will contact ACT team to determine when he last received Depot IM Invega.  Was started on Celexa, will decrease to 10 mgrs QDAY and titrate gradually as tolerated . Continue Neurontin at 300 mgrs BID which he states has been well tolerated and helpful.   Musculoskeletal: Strength & Muscle Tone: within normal limits Gait & Station: normal Patient leans: N/A  Psychiatric Specialty Exam: Physical Exam  ROS denies chest pain, no shortness of breath, no vomiting   Blood pressure 124/63, pulse 88, temperature 98.7 F (37.1 C), temperature source Oral, resp. rate 16, height 5\' 10"  (1.778 m), weight 107 kg (236 lb).Body mass index is 33.86 kg/m.  General Appearance: Fairly Groomed  Eye Contact:  Good  Speech:  Normal Rate  Volume:  Normal  Mood:  states he is feeling better since he was admitted to the hospital  Affect:  blunted  Thought Process:  Linear and Descriptions of  Associations: Circumstantial  Orientation:  Other:  fully alert and attentive   Thought Content:  denies any hallucinations at this time , does not present internally preoccupied, no delusions expressed   Suicidal Thoughts:   No denies any suicidal or self injurious ideations, denies any homicidal or violent ideations  Homicidal Thoughts:  No  Memory:  recent and remote grossly intact   Judgement:  Fair  Insight:  Fair  Psychomotor Activity:  Normal  Concentration:  Concentration: Good and Attention Span: Good  Recall:  Good  Fund of Knowledge:  Good  Language:  Good  Akathisia:  Negative  Handed:  Right  AIMS (if indicated):     Assets:  Communication Skills Desire for Improvement  ADL's:  Intact  Cognition:  WNL  Sleep:  Number of Hours: 6.75      COGNITIVE FEATURES THAT CONTRIBUTE TO RISK:  Closed-mindedness and Loss of executive function    SUICIDE RISK:   Moderate:  Frequent suicidal ideation with limited intensity, and duration, some specificity in terms of plans, no associated intent, good self-control, limited dysphoria/symptomatology, some risk factors present, and identifiable protective factors, including available and accessible social support.  PLAN OF CARE: Patient will be admitted to inpatient psychiatric unit for stabilization and safety. Will provide and encourage milieu participation. Provide medication management and maked adjustments as needed.  Will follow daily.    I certify that inpatient services furnished can reasonably be expected to improve the patient's condition.   Craige Cotta, MD 12/10/2016, 4:24 PM

## 2016-12-10 NOTE — Progress Notes (Signed)
Patient did not attend evening group. 

## 2016-12-10 NOTE — Progress Notes (Signed)
D   Pt spent some time in the dayroom this evening but mostly stayed in bed    His interaction is limited    He appears to be thought blocking and cognitively limited   He is very slow to respond and needs simple explanations  A    Verbal support given  Medications administered and effectiveness monitored   Q 15 min checks R   Pt is safe at present time

## 2016-12-10 NOTE — Social Work (Signed)
Patient has [facility] ACT Team, services can resume at discharge.  Ronald AltesLacretia Martinez [phone]) is worker for this patient.  Santa GeneraAnne Cunningham, LCSW Lead Clinical Social Worker Phone:  [phone]

## 2016-12-10 NOTE — BHH Group Notes (Signed)
[facility] Group Notes: Recovery  Patient attended group and was engaged. He listened intently throughout.  

## 2016-12-10 NOTE — Tx Team (Signed)
Interdisciplinary Treatment and Diagnostic Plan Update  12/10/2016 Time of Session: 0930 AM Ronald Martinez MRN: [id number]  Principal Diagnosis: Schizoaffective Disorder  Secondary Diagnoses: Active Problems:   Schizoaffective disorder, bipolar type (HCC)   Current Medications:  Current Facility-Administered Medications  Medication Dose Route Frequency Provider Last Rate Last Dose  . acetaminophen (TYLENOL) tablet 650 mg  650 mg Oral Q6H PRN Okonkwo, Justina A, NP      . albuterol (PROVENTIL HFA;VENTOLIN HFA) 108 (90 Base) MCG/ACT inhaler 1-2 puff  1-2 puff Inhalation Q6H PRN Okonkwo, Justina A, NP      . alum & mag hydroxide-simeth (MAALOX/MYLANTA) 200-200-20 MG/5ML suspension 30 mL  30 mL Oral Q4H PRN Okonkwo, Justina A, NP      . citalopram (CELEXA) tablet 20 mg  20 mg Oral Daily Okonkwo, Justina A, NP   20 mg at 12/10/16 0809  . gabapentin (NEURONTIN) capsule 300 mg  300 mg Oral BID Okonkwo, Justina A, NP   300 mg at 12/10/16 0809  . hydrOXYzine (ATARAX/VISTARIL) tablet 25 mg  25 mg Oral TID PRN Hughie Closs A, NP   25 mg at 12/09/16 2207  . magnesium hydroxide (MILK OF MAGNESIA) suspension 30 mL  30 mL Oral Daily PRN Okonkwo, Justina A, NP      . traZODone (DESYREL) tablet 50 mg  50 mg Oral QHS PRN,MR X 1 Okonkwo, Justina A, NP   50 mg at 12/09/16 2207   PTA Medications: Prescriptions Prior to Admission  Medication Sig Dispense Refill Last Dose  . albuterol (PROVENTIL HFA;VENTOLIN HFA) 108 (90 Base) MCG/ACT inhaler Inhale 1-2 puffs into the lungs every 6 (six) hours as needed for wheezing or shortness of breath. 1 Inhaler 0 EXPIRED at EXPIRED  . benztropine (COGENTIN) 1 MG tablet Take 1 tablet (1 mg total) by mouth daily. (Patient taking differently: Take 1 mg by mouth 2 (two) times daily. ) 30 tablet 0 Past Week at Unknown time  . buPROPion (WELLBUTRIN XL) 150 MG 24 hr tablet Take 150 mg by mouth every morning.   Past Week at Unknown time  . gabapentin (NEURONTIN) 300 MG capsule  Take 1 capsule (300 mg total) by mouth 2 (two) times daily. 60 capsule 0 Past Week at Unknown time  . lamoTRIgine (LAMICTAL) 100 MG tablet Take 100 mg by mouth every morning.   Past Week at Unknown time  . nicotine (NICODERM CQ - DOSED IN MG/24 HOURS) 21 mg/24hr patch Place 1 patch (21 mg total) onto the skin daily. (Patient not taking: Reported on 12/07/2016) 28 patch 0 Not Taking at Unknown time  . paliperidone (INVEGA) 6 MG 24 hr tablet Take 1 tablet (6 mg total) by mouth daily. (Patient not taking: Reported on 12/07/2016) 30 tablet 0 Not Taking at Unknown time  . Paliperidone Palmitate (INVEGA TRINZA) 819 MG/2.625ML SUSP Inject 819 mg into the muscle every 3 (three) months.   09/27/2016 at am  . potassium chloride SA (K-DUR,KLOR-CON) 20 MEQ tablet Take 2 tablets (40 mEq total) by mouth 2 (two) times daily. 40 tablet 0 UNK at [facility]  . traZODone (DESYREL) 50 MG tablet Take 1 tablet (50 mg total) by mouth at bedtime and may repeat dose one time if needed. (Patient taking differently: Take 50 mg by mouth at bedtime. ) 30 tablet 0 Past Week at Unknown time    Patient Stressors: Financial difficulties Medication change or noncompliance Substance abuse  Patient Strengths: Capable of independent living Curator fund of knowledge Motivation for treatment/growth Physical  Health  Treatment Modalities: Medication Management, Group therapy, Case management,  1 to 1 session with clinician, Psychoeducation, Recreational therapy.   Physician Treatment Plan for Primary Diagnosis: Schizoaffective Disorder  Medication Management: Evaluate patient's response, side effects, and tolerance of medication regimen.  Therapeutic Interventions: 1 to 1 sessions, Unit Group sessions and Medication administration.  Evaluation of Outcomes: Not Met  Physician Treatment Plan for Secondary Diagnosis: Active Problems:   Schizoaffective disorder, bipolar type ([place])    Medication Management: Evaluate  patient's response, side effects, and tolerance of medication regimen.  Therapeutic Interventions: 1 to 1 sessions, Unit Group sessions and Medication administration.  Evaluation of Outcomes: Progressing   RN Treatment Plan for Primary Diagnosis:Schizoaffective Disorder  Long Term Goal(s): Knowledge of disease and therapeutic regimen to maintain health will improve  Short Term Goals: Ability to remain free from injury will improve, Ability to disclose and discuss suicidal ideas and Ability to identify and develop effective coping behaviors will improve  Medication Management: RN will administer medications as ordered by provider, will assess and evaluate patient's response and provide education to patient for prescribed medication. RN will report any adverse and/or side effects to prescribing provider.  Therapeutic Interventions: 1 on 1 counseling sessions, Psychoeducation, Medication administration, Evaluate responses to treatment, Monitor vital signs and CBGs as ordered, Perform/monitor CIWA, COWS, AIMS and Fall Risk screenings as ordered, Perform wound care treatments as ordered.  Evaluation of Outcomes: Not Met   LCSW Treatment Plan for Primary Diagnosis: Schizoaffective Disorder Long Term Goal(s): Safe transition to appropriate next level of care at discharge, Engage patient in therapeutic group addressing interpersonal concerns.  Short Term Goals: Engage patient in aftercare planning with referrals and resources, Facilitate patient progression through stages of change regarding substance use diagnoses and concerns and Identify triggers associated with mental health/substance abuse issues  Therapeutic Interventions: Assess for all discharge needs, 1 to 1 time with Social worker, Explore available resources and support systems, Assess for adequacy in community support network, Educate family and significant other(s) on suicide prevention, Complete Psychosocial Assessment, Interpersonal  group therapy.  Evaluation of Outcomes: Not Met   Progress in Treatment: Attending groups: No. New to unit. Continuing to assess.  Participating in groups: No. Taking medication as prescribed: Yes. Toleration medication: Yes. Family/Significant other contact made: No, will contact:  family member if patient consents Patient understands diagnosis: Yes. Discussing patient identified problems/goals with staff: Yes. Medical problems stabilized or resolved: Yes. Denies suicidal/homicidal ideation: No. Passive SI/Able to contract for safety on the unit.  Issues/concerns per patient self-inventory: No. Other: n/a   New problem(s) identified: No, Describe:  n/a  New Short Term/Long Term Goal(s): elimination of SI/AH, development of comprehensive mental wellness plan, medication management for mood stabilization.   Discharge Plan or Barriers: Pt is followed by [facility].   Reason for Continuation of Hospitalization: Depression Hallucinations Medication stabilization Suicidal ideation  Estimated Length of Stay: tentative discharge Friday 12/13/16  Attendees: Patient: 12/10/2016 9:11 AM  Physician: Dr. Dwyane Dee MD 12/10/2016 9:11 AM  Nursing: Andria Frames RN 12/10/2016 9:11 AM  RN Care Manager: Skipper Cliche CM 12/10/2016 9:11 AM  Social Worker: Maxie Better, LCSW 12/10/2016 9:11 AM  Recreational Therapist: x 12/10/2016 9:11 AM  Other: Lindell Spar NP 12/10/2016 9:11 AM  Other:  12/10/2016 9:11 AM  Other: 12/10/2016 9:11 AM    Scribe for Treatment Team: Kimber Relic Smart, LCSW 12/10/2016 9:11 AM
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
## 2016-12-10 NOTE — Progress Notes (Signed)
Patient ID: Ronald ForesterKevin Rockhill, male   DOB: 10/15/1980, 36 y.o.   MRN: [id number] Pt visibile in the milieu.  Pt isolative, however, limited interactions with peers and staff is appropriate/pleasant.  Needs assessed.  Pt denied.  Fifteen minute checks continue for patient safety.  Pt safe on unit.

## 2016-12-10 NOTE — [facility] Group Notes (Signed)
[facility] LCSW Group Therapy  12/10/2016 1:49 PM  Type of Therapy:  Group Therapy  Participation Level:  Did Not Attend-pt invited. Chose to remain in bed.   Summary of Progress/Problems: [facility] Speaker came to talk about his personal journey with substance abuse and addiction. The pt processed ways by which to relate to the speaker. [facility] speaker provided handouts and educational information pertaining to groups and services offered by the [facility].   Zoei Amison N Smart LCSW 12/10/2016, 1:49 PM

## 2016-12-10 NOTE — Social Work (Signed)
Referred to [facility], is [place] resident.  Anne Cunningham, LCSW Lead Clinical Social Worker Phone:  [phone]  

## 2016-12-10 NOTE — H&P (Signed)
Psychiatric Admission Assessment Adult  Patient Identification: Ronald Martinez  MRN:  [id number]  Date of Evaluation:  12/10/2016  Chief Complaint: Worsening symptoms of Schizoaffective disorder triggering psychosis & suicidal ideations.  Principal Diagnosis: Schizoaffective disorder, Bipolar-type.  Diagnosis:   Patient Active Problem List   Diagnosis Date Noted  . Schizoaffective disorder, bipolar type (HCC) [F25.0] 08/16/2014    Priority: High  . 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 Cannabis use disorder, severe, dependence (HCC) [F12.20] 08/17/2014   History of Present Illness: This is one of numerous admission assessments in this hospital alone for Ronald Martinez, a 36 year old AA male with hx of mental illness. He is known to this unit from previous psychiatric hospitalizations. He is currently on Invega shots every 3 months & [facility] is in charge of his mental health care. He is also on other psychotropic medications including Neurontin routinely. During this assessment, [name] reports, "My friend dropped me off at the [facility] last Saturday. I was not getting any sleep for over 2 weeks. Then, my mental health & depression started to act up. I started to get more depressed & became suicidal with a plan to jump off a building & die. The suicidal thoughts started 2 weeks ago. I did not hurt myself, but had in 2005 when I overdosed on Seroquel & was put in the hospital. My mental illness started when I was a child. Mental illness runs in my family. Everyone in my family has some kind of mental illness, including my great-grandfathers. My mama is an alcoholic. I have been on mental health medications until a week ago when I stopped taking them. They were not working. But the Neurontin works. The Lamictal, Wellbutrin, Cogentin & Trazodone did not work.   Associated Signs/Symptoms:  Depression Symptoms:  depressed mood, insomnia, suicidal thoughts with specific  plan, anxiety,  (Hypo) Manic Symptoms:  Currently denies any hypomanic symptoms. says his mood is pretty much stable.  Anxiety Symptoms:  Excessive Worry,  Psychotic Symptoms:  Hallucinations: Auditory  PTSD Symptoms: Denies any PTSD symptoms or events.  Total Time spent with patient: 1 hour  Past Psychiatric History: Schizoaffective disorder, Benzodiazepine use disorder, Cannabis use disorder.  Is the patient at risk to self? Yes.    Has the patient been a risk to self in the past 6 months? No.  Has the patient been a risk to self within the distant past? Yes.    Is the patient a risk to others? No.  Has the patient been a risk to others in the past 6 months? No.  Has the patient been a risk to others within the distant past? No.   Prior Inpatient Therapy: Yes, [facility] x numerous times in the past.  Prior Outpatient Therapy: [facility].  Alcohol Screening: 1. How often do you have a drink containing alcohol?: Never 9. Have you or someone else been injured as a result of your drinking?: No 10. Has a relative or friend or a doctor or another health worker been concerned about your drinking or suggested you cut down?: No Alcohol Use Disorder Identification Test Final Score (AUDIT): 0 Brief Intervention: AUDIT score less than 7 or less-screening does not suggest unhealthy drinking-brief intervention not indicated  Substance Abuse History in the last 12 months:  Yes.   Consequences of Substance Abuse: Medical Consequences:  Liver damage, Possible death by overdose Legal Consequences:  Arrests, jail time, Loss of driving privilege. Family Consequences:  Family discord, divorce  and or separation.  Previous Psychotropic Medications: Wellbutrin, Lamictal, Trazodone, Lamictal, Cogentin. (Had been on Seroquel as well, however, overdose on it in 2005).  Psychological Evaluations: No   Past Medical History:  Past Medical History:  Diagnosis Date  . Asthma   . Bipolar 1 disorder (HCC)    . Depression   . Schizophrenia (HCC)    History reviewed. No pertinent surgical history.  Family History:  Family History  Problem Relation Age of Onset  . Asthma Mother   . Alcoholism Other    Family Psychiatric  History:   Tobacco Screening: Have you used any form of tobacco in the last 30 days? (Cigarettes, Smokeless Tobacco, Cigars, and/or Pipes): Yes Tobacco use, Select all that apply: 5 or more cigarettes per day Are you interested in Tobacco Cessation Medications?: No, patient refused Counseled patient on smoking cessation including recognizing danger situations, developing coping skills and basic information about quitting provided: Refused/Declined practical counseling  Social History:  History  Alcohol Use  . Yes     History  Drug Use  . Types: Marijuana, Benzodiazepines    Additional Social History: Pain Medications: None Prescriptions: see PTA meds- pt reports not taking x 3 weeks Over the Counter: none reported History of alcohol / drug use?: Yes Longest period of sobriety (when/how long): a couple of weeks Name of Substance 1: Cannabis 1 - Age of First Use: Unknown 1 - Amount (size/oz): Joint 1 - Frequency: 2x every 2 weeks 1 - Last Use / Amount: 1 joint  Allergies:   Allergies  Allergen Reactions  . Neoma LamingSalami [Pickled Meat] Nausea And Vomiting    Allergic to bologna and salami   Lab Results: No results found for this or any previous visit (from the past 48 hour(s)).  Blood Alcohol level:  Lab Results  Component Value Date   ETH <5 12/07/2016   ETH <5 01/08/2016   Metabolic Disorder Labs:  Lab Results  Component Value Date   HGBA1C 5.6 01/10/2016   MPG 114 01/10/2016   MPG 117 01/09/2016   Lab Results  Component Value Date   PROLACTIN 15.7 (H) 01/09/2016   PROLACTIN 20.7 (H) 01/26/2015   Lab Results  Component Value Date   CHOL 180 01/10/2016   TRIG 47 01/10/2016   HDL 69 01/10/2016   CHOLHDL 2.6 01/10/2016   VLDL 9 01/10/2016    LDLCALC 102 (H) 01/10/2016   LDLCALC 114 (H) 01/09/2016   Current Medications: Current Facility-Administered Medications  Medication Dose Route Frequency Provider Last Rate Last Dose  . acetaminophen (TYLENOL) tablet 650 mg  650 mg Oral Q6H PRN Okonkwo, Justina A, NP      . albuterol (PROVENTIL HFA;VENTOLIN HFA) 108 (90 Base) MCG/ACT inhaler 1-2 puff  1-2 puff Inhalation Q6H PRN Okonkwo, Justina A, NP      . alum & mag hydroxide-simeth (MAALOX/MYLANTA) 200-200-20 MG/5ML suspension 30 mL  30 mL Oral Q4H PRN Okonkwo, Justina A, NP      . citalopram (CELEXA) tablet 20 mg  20 mg Oral Daily Okonkwo, Justina A, NP   20 mg at 12/10/16 0809  . gabapentin (NEURONTIN) capsule 300 mg  300 mg Oral BID Okonkwo, Justina A, NP   300 mg at 12/10/16 0809  . hydrOXYzine (ATARAX/VISTARIL) tablet 25 mg  25 mg Oral TID PRN Ferne Reuskonkwo, Justina A, NP   25 mg at 12/09/16 2207  . magnesium hydroxide (MILK OF MAGNESIA) suspension 30 mL  30 mL Oral Daily PRN Beryle Lathekonkwo, Justina A, NP      .  nicotine (NICODERM CQ - dosed in mg/24 hours) patch 21 mg  21 mg Transdermal Daily Kunta Hilleary I, NP      . potassium chloride SA (K-DUR,KLOR-CON) CR tablet 40 mEq  40 mEq Oral BID Dannel Rafter I, NP      . traZODone (DESYREL) tablet 50 mg  50 mg Oral QHS PRN,MR X 1 Okonkwo, Justina A, NP   50 mg at 12/09/16 2207  . traZODone (DESYREL) tablet 50 mg  50 mg Oral QHS Armandina Stammer I, NP       PTA Medications: Prescriptions Prior to Admission  Medication Sig Dispense Refill Last Dose  . albuterol (PROVENTIL HFA;VENTOLIN HFA) 108 (90 Base) MCG/ACT inhaler Inhale 1-2 puffs into the lungs every 6 (six) hours as needed for wheezing or shortness of breath. 1 Inhaler 0 EXPIRED at EXPIRED  . benztropine (COGENTIN) 1 MG tablet Take 1 tablet (1 mg total) by mouth daily. (Patient taking differently: Take 1 mg by mouth 2 (two) times daily. ) 30 tablet 0 Past Week at Unknown time  . buPROPion (WELLBUTRIN XL) 150 MG 24 hr tablet Take 150 mg by mouth every  morning.   Past Week at Unknown time  . gabapentin (NEURONTIN) 300 MG capsule Take 1 capsule (300 mg total) by mouth 2 (two) times daily. 60 capsule 0 Past Week at Unknown time  . lamoTRIgine (LAMICTAL) 100 MG tablet Take 100 mg by mouth every morning.   Past Week at Unknown time  . nicotine (NICODERM CQ - DOSED IN MG/24 HOURS) 21 mg/24hr patch Place 1 patch (21 mg total) onto the skin daily. (Patient not taking: Reported on 12/07/2016) 28 patch 0 Not Taking at Unknown time  . paliperidone (INVEGA) 6 MG 24 hr tablet Take 1 tablet (6 mg total) by mouth daily. (Patient not taking: Reported on 12/07/2016) 30 tablet 0 Not Taking at Unknown time  . Paliperidone Palmitate (INVEGA TRINZA) 819 MG/2.625ML SUSP Inject 819 mg into the muscle every 3 (three) months.   09/27/2016 at am  . potassium chloride SA (K-DUR,KLOR-CON) 20 MEQ tablet Take 2 tablets (40 mEq total) by mouth 2 (two) times daily. 40 tablet 0 UNK at [facility]  . traZODone (DESYREL) 50 MG tablet Take 1 tablet (50 mg total) by mouth at bedtime and may repeat dose one time if needed. (Patient taking differently: Take 50 mg by mouth at bedtime. ) 30 tablet 0 Past Week at Unknown time   Musculoskeletal: Strength & Muscle Tone: within normal limits Gait & Station: normal Patient leans: N/A  Psychiatric Specialty Exam: Physical Exam  Constitutional: He appears well-developed.  HENT:  Head: Normocephalic.  Eyes: Pupils are equal, round, and reactive to light.  Cardiovascular: Normal rate.   Respiratory: Effort normal.  GI: Soft.  Genitourinary:  Genitourinary Comments: Deferred  Musculoskeletal: Normal range of motion.  Neurological: He is alert.  Skin: Skin is warm and dry.    Review of Systems  Constitutional: Negative.   HENT: Negative.   Eyes: Negative.   Cardiovascular: Negative.   Gastrointestinal: Negative.   Genitourinary: Negative.   Musculoskeletal: Negative.   Skin: Negative.   Neurological: Negative.   Endo/Heme/Allergies:  Negative.   Psychiatric/Behavioral: Positive for depression, hallucinations (Hx. auditory hallucinations.), substance abuse (UDS positive for THC) and suicidal ideas. Negative for memory loss. The patient is nervous/anxious and has insomnia.     Blood pressure 124/63, pulse 88, temperature 98.7 F (37.1 C), temperature source Oral, resp. rate 16, height 5\' 10"  (1.778 m), weight 107 kg (  236 lb).Body mass index is 33.86 kg/m.  General Appearance: Casual and quiet  Eye Contact:  Good  Speech:  Clear, coherent, decreased rate. Stutters at times.  Volume:  Decreased  Mood:  Depressed  Affect:  Congruent, Depressed and Flat  Thought Process:  Coherent  Orientation:  Full (Time, Place, and Person)  Thought Content:  Hallucinations: Auditory  Suicidal Thoughts:  Currently denies any thoughts, plans or intent. Able to contract for safety.  Homicidal Thoughts:  Denies any thoughts, plans or intent  Memory:  Immediate;   Fair Recent;   Fair Remote;   Fair  Judgement:  Impaired  Insight:  Fair  Psychomotor Activity:  Decreased  Concentration:  Concentration: Fair and Attention Span: Fair  Recall:  [employer] of Knowledge:  Fair  Language:  Good  Akathisia:  No  Handed:  Right  AIMS (if indicated):     Assets:  Communication Skills Desire for Improvement Social Support  ADL's:  Intact  Cognition:  WNL  Sleep:  Number of Hours: 6.75   Treatment Plan/Recommendations: 1. Admit for crisis management and stabilization, estimated length of stay 3-5 days.  2. Medication management to reduce current symptoms to base line and improve the patient's overall level of functioning: See MAR, Md's SRA & Treatment plan.  3. Treat health problems as indicated.  4. Develop treatment plan to decrease risk of relapse upon discharge and the need for readmission.  5. Psycho-social education regarding relapse prevention and self care.  6. Health care follow up as needed for medical problems.  7. Review,  reconcile, and reinstate any pertinent home medications for other health issues where appropriate. 8. Call for consults with hospitalist for any additional specialty patient care services as needed.  Observation Level/Precautions:  15 minute checks  Laboratory:  Per ED, UDS (+) for [facility]  Psychotherapy: Group sessions   Medications: See [facility]    Consultations: As needed   Discharge Concerns: Safety, mood stability.  Estimated LOS: 2-4 days  Other: Admit to the [facility].    Physician Treatment Plan for Primary Diagnosis: Will initiate medication management for mood stability. Set up an outpatient psychiatric services for medication management. Will encourage medication adherence with psychiatric medications.  Long Term Goal(s): Improvement in symptoms so as ready for discharge  Short Term Goals: Ability to identify changes in lifestyle to reduce recurrence of condition will improve, Ability to verbalize feelings will improve and Ability to disclose and discuss suicidal ideas  Physician Treatment Plan for Secondary Diagnosis: Active Problems:   Schizoaffective disorder, bipolar type (HCC)  Long Term Goal(s): Improvement in symptoms so as ready for discharge  Short Term Goals: Ability to identify and develop effective coping behaviors will improve, Compliance with prescribed medications will improve and Ability to identify triggers associated with substance abuse/mental health issues will improve  I certify that inpatient services furnished can reasonably be expected to improve the patient's condition.    Sanjuana Kava, NP, PMHNP, FNP-BC 7/10/20181:45 PM

## 2016-12-11 ENCOUNTER — Encounter (HOSPITAL_COMMUNITY): Payer: Self-pay | Admitting: Behavioral Health

## 2016-12-11 DIAGNOSIS — Z811 Family history of alcohol abuse and dependence: Secondary | ICD-10-CM

## 2016-12-11 DIAGNOSIS — F1290 Cannabis use, unspecified, uncomplicated: Secondary | ICD-10-CM

## 2016-12-11 DIAGNOSIS — F17210 Nicotine dependence, cigarettes, uncomplicated: Secondary | ICD-10-CM

## 2016-12-11 DIAGNOSIS — F1390 Sedative, hypnotic, or anxiolytic use, unspecified, uncomplicated: Secondary | ICD-10-CM

## 2016-12-11 DIAGNOSIS — F259 Schizoaffective disorder, unspecified: Secondary | ICD-10-CM

## 2016-12-11 LAB — LIPID PANEL
CHOL/HDL RATIO: 3.1 ratio
CHOLESTEROL: 184 mg/dL (ref 0–200)
HDL: 59 mg/dL (ref >40)
LDL CALC: 112 mg/dL — AB (ref 0–99)
TRIGLYCERIDES: 65 mg/dL (ref <150)
VLDL: 13 mg/dL (ref 0–40)

## 2016-12-11 LAB — TSH: TSH: 1.089 u[IU]/mL (ref 0.350–4.500)

## 2016-12-11 NOTE — Progress Notes (Signed)
  DATA ACTION RESPONSE  Objective- Pt. is visible in the room, resting in bed with eyes closed. Pt. was awaken for assessment. Presents with a flat/sad   affect and mood. Pt. appears isolative to room. Pt. was minimal with interaction. No further c/o. No abnormal s/s.  Subjective- Denies having any SI/HI/AVH/Pain at this time.Is cooperative and remain safe on the unit.  1:1 interaction in private to establish rapport. Encouragement, education, & support given from staff. No meds. ordered at bedtime .  Safety maintained with Q 15 checks. Continue with POC.

## 2016-12-11 NOTE — Progress Notes (Signed)
Patient did not attend NA meeting.  

## 2016-12-11 NOTE — Progress Notes (Signed)
Recreation Therapy Notes  Date: 12/11/16 Time: 0930 Location: [facility] Hall Dayroom  Group Topic: Stress Management  Goal Area(s) Addresses:  Patient will verbalize importance of using healthy stress management.  Patient will identify positive emotions associated with healthy stress management.   Behavioral Response: Engaged  Intervention: Stress Management  Activity :  Meditation. Recreation Therapy Intern introduced the stress management technique of meditation. Recreation Therapy Intern read a script that allowed patients to take mental trip to the mountains. Recreation Therapy Intern played mountain meditation music. Patients were to follow along as script was read to engage in the activity.  Education: Stress Management, Discharge Planning.   Education Outcome: Acknowledges edcuation  Clinical Observations/Feedback: Pt attended group.  Rachel Meyer, Recreation Therapy Intern  Zurri Rudden, LRT/CTRS 

## 2016-12-11 NOTE — Progress Notes (Signed)
DAR NOTE Pt has been calm and cooperative with the care through out the shift. Pt has been visible in the milieu interacting well with peers and staff. Pt is not guarded as much and reaching out more for help. Pt reports good night sleep, good appetite, normal energy, and good concentration. Pt denies physical pain, SI/HI, AVH and verbally contracted for safety. Medication given as prescribed. Support and encouragement offered as needed.  Will continue to monitor.

## 2016-12-11 NOTE — Plan of Care (Signed)
Problem: Activity: Goal: Interest or engagement in activities will improve Outcome: Not Progressing Pt. did not attend NA meeting.    

## 2016-12-11 NOTE — [facility] Group Notes (Addendum)
Adult Psychoeducational Group Note  Date:  12/11/2016 Time:  1:30 - 2:15 PM  Group Topic/Focus:   Emotional Regulation:   The focus of this group is to discuss what feelings/emotions are, and how they are experienced.  Participation Level:  Invited, chose not to attend and remained in bed.  Additional Comments:  Emotion Regulation: This group focused on both positive and negative emotion identification and allowed group members to process ways to identify feelings, regulate negative emotions, and find healthy ways to manage internal/external emotions. Group members were asked to reflect on a time when their reaction to an emotion led to a negative outcome and explored how alternative responses using emotion regulation would have benefited them. Group members were also asked to discuss a time when emotion regulation was utilized when a negative emotion was experienced.    Kona Yusuf C Semaj Kham LCSW 12/11/2016, 2:45 PM  

## 2016-12-11 NOTE — Progress Notes (Signed)
[facility] MD Progress Note  12/11/2016 1:53 PM Ronald Martinez  MRN:  [id number] Subjective:  " I am doing good. Would like to know when I will be discharged?"  Evaluation on the unit: Face to face evaluation completed, case discussed during treatment team, chart reviewed. Ronald Martinez, a 36 year old AA male with hx of mental illness and multiple admissions to [facility]. During this evaluation he is alert and oriented x4, calm, and cooperative. He is noted pacing back and forth down the hallway. As per staff, "patients interaction is limited with others while on the unit." He presents with some thought blocking and appears to be cognitively limited   He  Continues to be very slow to respond to questions asked by Clinical research associate. There is a stutter noted in his speech. He denies current SI, AVH, HI. Denies paranoia, delusions or other psychotic processes.  Endorses good appetite and sleeping pattern. Appears to be tolerating medications well and denies side effects. At this time, he is able to contract for safety on the unit. Support and encouragement provided.     Principal Problem: <principal problem not specified> Diagnosis:   Patient Active Problem List   Diagnosis Date Noted  . 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 Cannabis use disorder, severe, dependence (HCC) [F12.20] 08/17/2014  . Schizoaffective disorder, bipolar type (HCC) [F25.0] 08/16/2014   Total Time spent with patient: 25 minutes  Past Psychiatric History:  Schizoaffective disorder, Benzodiazepine use disorder, Cannabis use disorder.   Past Medical History:  Past Medical History:  Diagnosis Date  . Asthma   . Bipolar 1 disorder (HCC)   . Depression   . Schizophrenia (HCC)    History reviewed. No pertinent surgical history. Family History:  Family History  Problem Relation Age of Onset  . Asthma Mother   . Alcoholism Other    Family Psychiatric  History: SEE H&P Social History:  History  Alcohol Use  . Yes     History   Drug Use  . Types: Marijuana, Benzodiazepines    Social History   Social History  . Marital status: Single    Spouse name: N/A  . Number of children: N/A  . Years of education: N/A   Social History Main Topics  . Smoking status: Current Some Day Smoker    Packs/day: 1.00    Types: Cigarettes  . Smokeless tobacco: Never Used  . Alcohol use Yes  . Drug use: Yes    Types: Marijuana, Benzodiazepines  . Sexual activity: No   Other Topics Concern  . None   Social History Narrative  . None   Additional Social History:    Pain Medications: None Prescriptions: see PTA meds- pt reports not taking x 3 weeks Over the Counter: none reported History of alcohol / drug use?: Yes Longest period of sobriety (when/how long): a couple of weeks Name of Substance 1: Cannabis 1 - Age of First Use: Unknown 1 - Amount (size/oz): Joint 1 - Frequency: 2x every 2 weeks 1 - Last Use / Amount: 1 joint        Sleep: Fair  Appetite:  Fair  Current Medications: Current Facility-Administered Medications  Medication Dose Route Frequency Provider Last Rate Last Dose  . acetaminophen (TYLENOL) tablet 650 mg  650 mg Oral Q6H PRN Okonkwo, Justina A, NP      . albuterol (PROVENTIL HFA;VENTOLIN HFA) 108 (90 Base) MCG/ACT inhaler 1-2 puff  1-2 puff Inhalation Q6H PRN Okonkwo, Justina A, NP      .  alum & mag hydroxide-simeth (MAALOX/MYLANTA) 200-200-20 MG/5ML suspension 30 mL  30 mL Oral Q4H PRN Okonkwo, Justina A, NP      . benztropine (COGENTIN) tablet 0.5 mg  0.5 mg Oral BID Cobos, Rockey Situ, MD   0.5 mg at 12/11/16 0904  . citalopram (CELEXA) tablet 10 mg  10 mg Oral Daily Cobos, Rockey Situ, MD   10 mg at 12/11/16 0904  . gabapentin (NEURONTIN) capsule 300 mg  300 mg Oral BID Okonkwo, Justina A, NP   300 mg at 12/11/16 0904  . hydrOXYzine (ATARAX/VISTARIL) tablet 25 mg  25 mg Oral TID PRN Beryle Lathe, Justina A, NP   25 mg at 12/11/16 0106  . magnesium hydroxide (MILK OF MAGNESIA) suspension 30 mL   30 mL Oral Daily PRN Okonkwo, Justina A, NP      . nicotine (NICODERM CQ - dosed in mg/24 hours) patch 21 mg  21 mg Transdermal Daily Nwoko, Agnes I, NP      . paliperidone (INVEGA) 24 hr tablet 6 mg  6 mg Oral Daily Nwoko, Agnes I, NP   6 mg at 12/11/16 0904  . traZODone (DESYREL) tablet 50 mg  50 mg Oral QHS PRN Cobos, Rockey Situ, MD   50 mg at 12/11/16 0106    Lab Results:  Results for orders placed or performed during the hospital encounter of 12/09/16 (from the past 48 hour(s))  TSH     Status: None   Collection Time: 12/11/16  6:31 AM  Result Value Ref Range   TSH 1.089 0.350 - 4.500 uIU/mL    Comment: Performed by a 3rd Generation assay with a functional sensitivity of <=0.01 uIU/mL. Performed at [facility], [address], [place], [place] [address]   Lipid panel     Status: Abnormal   Collection Time: 12/11/16  6:31 AM  Result Value Ref Range   Cholesterol 184 0 - 200 mg/dL   Triglycerides 65 <409 mg/dL   HDL 59 >81 mg/dL   Total CHOL/HDL Ratio 3.1 RATIO   VLDL 13 0 - 40 mg/dL   LDL Cholesterol 191 (H) 0 - 99 mg/dL    Comment:        Total Cholesterol/HDL:CHD Risk Coronary Heart Disease Risk Table                     Men   Women  1/2 Average Risk   3.4   3.3  Average Risk       5.0   4.4  2 X Average Risk   9.6   7.1  3 X Average Risk  23.4   11.0        Use the calculated Patient Ratio above and the CHD Risk Table to determine the patient's CHD Risk.        ATP III CLASSIFICATION (LDL):  <100     mg/dL   Optimal  478-295  mg/dL   Near or Above                    Optimal  130-159  mg/dL   Borderline  621-308  mg/dL   High  >657     mg/dL   Very High Performed at [facility], [address], [place], [place] [address]     Blood Alcohol level:  Lab Results  Component Value Date   [facility] <5 12/07/2016   ETH <5 01/08/2016    Metabolic Disorder Labs: Lab Results  Component  Value Date   HGBA1C 5.6 01/10/2016   MPG 114 01/10/2016    MPG 117 01/09/2016   Lab Results  Component Value Date   PROLACTIN 15.7 (H) 01/09/2016   PROLACTIN 20.7 (H) 01/26/2015   Lab Results  Component Value Date   CHOL 184 12/11/2016   TRIG 65 12/11/2016   HDL 59 12/11/2016   CHOLHDL 3.1 12/11/2016   VLDL 13 12/11/2016   LDLCALC 112 (H) 12/11/2016   LDLCALC 102 (H) 01/10/2016    Physical Findings: AIMS: Facial and Oral Movements Muscles of Facial Expression: None, normal Lips and Perioral Area: None, normal Jaw: None, normal Tongue: None, normal,Extremity Movements Upper (arms, wrists, hands, fingers): None, normal Lower (legs, knees, ankles, toes): None, normal, Trunk Movements Neck, shoulders, hips: None, normal, Overall Severity Severity of abnormal movements (highest score from questions above): None, normal Incapacitation due to abnormal movements: None, normal Patient's awareness of abnormal movements (rate only patient's report): No Awareness, Dental Status Current problems with teeth and/or dentures?: No Does patient usually wear dentures?: No  CIWA:    COWS:     Musculoskeletal: Strength & Muscle Tone: within normal limits Gait & Station: normal Patient leans: N/A  Psychiatric Specialty Exam: Physical Exam  Nursing note and vitals reviewed. Constitutional: He is oriented to person, place, and time.  Neurological: He is alert and oriented to person, place, and time.    Review of Systems  Psychiatric/Behavioral: Positive for depression and substance abuse. Negative for hallucinations, memory loss and suicidal ideas. The patient is nervous/anxious. The patient does not have insomnia.   All other systems reviewed and are negative.   Blood pressure (!) 101/52, pulse 93, temperature 98.3 F (36.8 C), temperature source Oral, resp. rate 16, height 5\' 10"  (1.778 m), weight 236 lb (107 kg).Body mass index is 33.86 kg/m.  General Appearance: Fairly Groomed  Eye Contact:  Good  Speech:  Clear and Coherent, Slow and  stutters at times   Volume:  Normal  Mood:  Depressed  Affect:  Blunt  Thought Process:  Coherent, Linear and Descriptions of Associations: Intact  Orientation:  Full (Time, Place, and Person)  Thought Content:  Logical denies any hallucinations at this time , does not present internally preoccupied, no delusions expressed   Suicidal Thoughts:  No  Homicidal Thoughts:  No  Memory:  Immediate;   Fair Recent;   Fair  Judgement:  Fair  Insight:  Fair  Psychomotor Activity:  Decreased  Concentration:  Concentration: Fair and Attention Span: Fair  Recall:  [employer] of Knowledge:  Fair  Language:  Fair  Akathisia:  Negative  Handed:  Right  AIMS (if indicated):     Assets:  Desire for Improvement Resilience  ADL's:  Intact  Cognition:  WNL  Sleep:  Number of Hours: 6.75     Treatment Plan Summary: Daily contact with patient to assess and evaluate symptoms and progress in treatment   Medication management: Psychiatric conditions are unstable at this time. To reduce current symptoms to base line and improve the patient's overall level of functioning will continue medications for Schizoaffective disorder; Celexa 10 mgrs QDAY and titrate gradually as tolerated, Neurontin at 300 mgrs BID, Invega 6 mg po daily. Will continue Nicoderm patch 21 mg TD for smoking cessation and Vistaril 25 mg po Q6hrs for anxiety.    Other:  Safety: Will continue 15 minute observation for safety checks. Patient is able to contract for safety on the unit at this time  Labs: Prolactin  and HgbA1c in process. LDL 112. TSH normal.  Continue to develop treatment plan to decrease risk of relapse upon discharge and to reduce the need for readmission.  Psycho-social education regarding relapse prevention and self care.  Health care follow up as needed for medical problems.LDL 112.  Continue to attend and participate in therapy.     Denzil MagnusonLaShunda Thomas, NP 12/11/2016, 1:53 PM  Agree with NP Progress Note

## 2016-12-12 ENCOUNTER — Encounter (HOSPITAL_COMMUNITY): Payer: Self-pay | Admitting: Behavioral Health

## 2016-12-12 LAB — PROLACTIN: Prolactin: 23.9 ng/mL — ABNORMAL HIGH (ref 4.0–15.2)

## 2016-12-12 LAB — HEMOGLOBIN A1C
Hgb A1c MFr Bld: 5.7 % — ABNORMAL HIGH (ref 4.8–5.6)
Mean Plasma Glucose: 117 mg/dL

## 2016-12-12 NOTE — Progress Notes (Signed)
Patient attended karaoke group tonight.  

## 2016-12-12 NOTE — Progress Notes (Signed)
DAR NOTE Pt present with bright affect and jovial mood today, pt has been observed on the hall way walking and talking to peers and staff without any problems. Pt sated he had a good night sleep last night, good appetite, normal energy level and good concentration. Pt rates depression at 3, hopelessness at 3, and anxiety at 3. Pt states his goal for today is "work towards discharge.' Medication given as prescribed, support and encouragement offered as needed, denied SI/HI, AVH and verbally contracted for safety, will continue to monitor.

## 2016-12-12 NOTE — Progress Notes (Signed)
[facility] MD Progress Note  12/12/2016 10:39 AM Ronald Martinez  MRN:  [id number]  Subjective:  " They told me I may be leaving tomorrow. I feel ok to go home"  Evaluation on the unit: Face to face evaluation completed, case discussed during treatment team, chart reviewed. Ronald Martinez, a 36 year old AA male with hx of mental illness and multiple admissions to [facility]. During this evaluation he is alert and oriented x4, calm, and cooperative. He shows some improvement in psychiatric condition. He is often noted laughing and smiling with staff and peers. As per nursing, patient interacts well with others and no behavioral issues have been reported or observed. He continues to take medications as prescribed and denies medication related side effects. Endorses no alterations or difficulties in sleeping pattern or appetite.Less though blocking observed. He denies current SI, AVH, HI. Denies paranoia, delusions or other psychotic processes. At this time, he is able to contract for safety on the unit. Support and encouragement continue to be provided.     Principal Problem: <principal problem not specified> Diagnosis:   Patient Active Problem List   Diagnosis Date Noted  . 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 Cannabis use disorder, severe, dependence (HCC) [F12.20] 08/17/2014  . Schizoaffective disorder, bipolar type (HCC) [F25.0] 08/16/2014   Total Time spent with patient: 25 minutes  Past Psychiatric History:  Schizoaffective disorder, Benzodiazepine use disorder, Cannabis use disorder.   Past Medical History:  Past Medical History:  Diagnosis Date  . Asthma   . Bipolar 1 disorder (HCC)   . Depression   . Schizophrenia (HCC)    History reviewed. No pertinent surgical history. Family History:  Family History  Problem Relation Age of Onset  . Asthma Mother   . Alcoholism Other    Family Psychiatric  History: SEE H&P Social History:  History  Alcohol Use  . Yes     History   Drug Use  . Types: Marijuana, Benzodiazepines    Social History   Social History  . Marital status: Single    Spouse name: N/A  . Number of children: N/A  . Years of education: N/A   Social History Main Topics  . Smoking status: Current Some Day Smoker    Packs/day: 1.00    Types: Cigarettes  . Smokeless tobacco: Never Used  . Alcohol use Yes  . Drug use: Yes    Types: Marijuana, Benzodiazepines  . Sexual activity: No   Other Topics Concern  . None   Social History Narrative  . None   Additional Social History:    Pain Medications: None Prescriptions: see PTA meds- pt reports not taking x 3 weeks Over the Counter: none reported History of alcohol / drug use?: Yes Longest period of sobriety (when/how long): a couple of weeks Name of Substance 1: Cannabis 1 - Age of First Use: Unknown 1 - Amount (size/oz): Joint 1 - Frequency: 2x every 2 weeks 1 - Last Use / Amount: 1 joint    Sleep: Fair  Appetite:  Fair  Current Medications: Current Facility-Administered Medications  Medication Dose Route Frequency Provider Last Rate Last Dose  . acetaminophen (TYLENOL) tablet 650 mg  650 mg Oral Q6H PRN Okonkwo, Justina A, NP      . albuterol (PROVENTIL HFA;VENTOLIN HFA) 108 (90 Base) MCG/ACT inhaler 1-2 puff  1-2 puff Inhalation Q6H PRN Okonkwo, Justina A, NP      . alum & mag hydroxide-simeth (MAALOX/MYLANTA) 200-200-20 MG/5ML suspension 30 mL  30 mL Oral  Q4H PRN Ferne Reus A, NP      . benztropine (COGENTIN) tablet 0.5 mg  0.5 mg Oral BID Cobos, Rockey Situ, MD   0.5 mg at 12/12/16 0803  . citalopram (CELEXA) tablet 10 mg  10 mg Oral Daily Cobos, Rockey Situ, MD   10 mg at 12/12/16 0802  . gabapentin (NEURONTIN) capsule 300 mg  300 mg Oral BID Okonkwo, Justina A, NP   300 mg at 12/12/16 0803  . hydrOXYzine (ATARAX/VISTARIL) tablet 25 mg  25 mg Oral TID PRN Beryle Lathe, Justina A, NP   25 mg at 12/11/16 0106  . magnesium hydroxide (MILK OF MAGNESIA) suspension 30 mL  30 mL  Oral Daily PRN Okonkwo, Justina A, NP      . nicotine (NICODERM CQ - dosed in mg/24 hours) patch 21 mg  21 mg Transdermal Daily Nwoko, Agnes I, NP      . paliperidone (INVEGA) 24 hr tablet 6 mg  6 mg Oral Daily Nwoko, Agnes I, NP   6 mg at 12/12/16 0803  . traZODone (DESYREL) tablet 50 mg  50 mg Oral QHS PRN Cobos, Rockey Situ, MD   50 mg at 12/11/16 0106    Lab Results:  Results for orders placed or performed during the hospital encounter of 12/09/16 (from the past 48 hour(s))  TSH     Status: None   Collection Time: 12/11/16  6:31 AM  Result Value Ref Range   TSH 1.089 0.350 - 4.500 uIU/mL    Comment: Performed by a 3rd Generation assay with a functional sensitivity of <=0.01 uIU/mL. Performed at [facility], [address], [place], [place] [address]   Hemoglobin A1c     Status: Abnormal   Collection Time: 12/11/16  6:31 AM  Result Value Ref Range   Hgb A1c MFr Bld 5.7 (H) 4.8 - 5.6 %    Comment: (NOTE)         Pre-diabetes: 5.7 - 6.4         Diabetes: >6.4         Glycemic control for adults with diabetes: <7.0    Mean Plasma Glucose 117 mg/dL    Comment: (NOTE) Performed At: [facility] [address] [place], [place] [id number] Mila Homer MD [id number] Performed at [facility], [address], [place], [place] [address]   Lipid panel     Status: Abnormal   Collection Time: 12/11/16  6:31 AM  Result Value Ref Range   Cholesterol 184 0 - 200 mg/dL   Triglycerides 65 <784 mg/dL   HDL 59 >69 mg/dL   Total CHOL/HDL Ratio 3.1 RATIO   VLDL 13 0 - 40 mg/dL   LDL Cholesterol 629 (H) 0 - 99 mg/dL    Comment:        Total Cholesterol/HDL:CHD Risk Coronary Heart Disease Risk Table                     Men   Women  1/2 Average Risk   3.4   3.3  Average Risk       5.0   4.4  2 X Average Risk   9.6   7.1  3 X Average Risk  23.4   11.0        Use the calculated Patient Ratio above and the CHD Risk Table to determine the  patient's CHD Risk.        ATP III CLASSIFICATION (LDL):  <100  mg/dL   Optimal  161-096100-129  mg/dL   Near or Above                    Optimal  130-159  mg/dL   Borderline  045-409160-189  mg/dL   High  >811>190     mg/dL   Very High Performed at [facility], [address] [place], [place] [address]   Prolactin     Status: Abnormal   Collection Time: 12/11/16  6:31 AM  Result Value Ref Range   Prolactin 23.9 (H) 4.0 - 15.2 ng/mL    Comment: (NOTE) Performed At: [facility] [address] [place], [place] [id number] Mila HomerHancock William F MD [phone] Performed at [facility], [address] [place], [place] [address]     Blood Alcohol level:  Lab Results  Component Value Date   [facility] 12/07[facility]2016   ETH <5 01/08/2016    Metabolic Disorder Labs: Lab Results  Component Value Date   HGBA1C 5.7 (H) 12/11/2016   MPG 117 12/11/2016   MPG 114 01/10/2016   Lab Results  Component Value Date   PROLACTIN 23.9 (H) 12/11/2016   PROLACTIN 15.7 (H) 01/09/2016   Lab Results  Component Value Date   CHOL 184 12/11/2016   TRIG 65 12/11/2016   HDL 59 12/11/2016   CHOLHDL 3.1 12/11/2016   VLDL 13 12/11/2016   LDLCALC 112 (H) 12/11/2016   LDLCALC 102 (H) 01/10/2016    Physical Findings: AIMS: Facial and Oral Movements Muscles of Facial Expression: None, normal Lips and Perioral Area: None, normal Jaw: None, normal Tongue: None, normal,Extremity Movements Upper (arms, wrists, hands, fingers): None, normal Lower (legs, knees, ankles, toes): None, normal, Trunk Movements Neck, shoulders, hips: None, normal, Overall Severity Severity of abnormal movements (highest score from questions above): None, normal Incapacitation due to abnormal movements: None, normal Patient's awareness of abnormal movements (rate only patient's report): No Awareness, Dental Status Current problems with teeth and/or dentures?: No Does patient usually wear dentures?: No  CIWA:     COWS:     Musculoskeletal: Strength & Muscle Tone: within normal limits Gait & Station: normal Patient leans: N/A  Psychiatric Specialty Exam: Physical Exam  Nursing note and vitals reviewed. Constitutional: He is oriented to person, place, and time.  Neurological: He is alert and oriented to person, place, and time.    Review of Systems  Psychiatric/Behavioral: Positive for depression and substance abuse. Negative for hallucinations, memory loss and suicidal ideas. The patient is not nervous/anxious and does not have insomnia.   All other systems reviewed and are negative.   Blood pressure (!) 110/59, pulse 92, temperature 98.4 F (36.9 C), resp. rate 16, height 5\' 10"  (1.778 m), weight 236 lb (107 kg).Body mass index is 33.86 kg/m.  General Appearance: Fairly Groomed  Eye Contact:  Good  Speech:  Clear and Coherent, Slow and stutters at times   Volume:  Normal  Mood:  Depressed yet improvement noted   Affect:  Appropriate  Thought Process:  Coherent, Linear and Descriptions of Associations: Intact  Orientation:  Full (Time, Place, and Person)  Thought Content:  Logical denies any hallucinations at this time , does not present internally preoccupied, no delusions expressed   Suicidal Thoughts:  No  Homicidal Thoughts:  No  Memory:  Immediate;   Fair Recent;   Fair  Judgement:  Fair  Insight:  Fair  Psychomotor Activity:  Decreased  Concentration:  Concentration: Fair and Attention Span:  Fair  Recall:  [employer] of Knowledge:  Fair  Language:  Fair  Akathisia:  Negative  Handed:  Right  AIMS (if indicated):     Assets:  Desire for Improvement Resilience  ADL's:  Intact  Cognition:  WNL  Sleep:  Number of Hours: 6.75     Treatment Plan Summary: Daily contact with patient to assess and evaluate symptoms and progress in treatment   Medication management: Psychiatric conditions shows slight improvement. To reduce current symptoms to base line and improve the  patient's overall level of functioning will continue the following treatment plan without adjustments;    Schizoaffective disorder-Conitnue Celexa 10 mgrs QDAY and titrate gradually as tolerated, Neurontin at 300 mgrs BID, Invega 6 mg po daily.   EPS-Continue cogentin 0.5 mg po bid for drug induced EPS   Smoking cessation-Continue Nicoderm patch 21 mg TD for smoking cessation   Insomnia-Continue Vistaril 25 mg po Q6hrs for anxiety.   Anxiety- Continue Vistaril 25 mg po q6hrs as needed for anxiety  Other:  Safety: Will continue 15 minute observation for safety checks. Patient is able to contract for safety on the unit at this time  Labs: Prolactin 23.9 and HgbA1c 5.7  Continue to develop treatment plan to decrease risk of relapse upon discharge and to reduce the need for readmission.  Psycho-social education regarding relapse prevention and self care.  Health care follow up as needed for medical problems.LDL 112. HgbA1c 5.7  Continue to attend and participate in therapy.     Denzil Magnuson, NP 12/12/2016, 10:39 AM    Patient ID: Warnell Forester, male   DOB: August 29, 1980, 36 y.o.   MRN: [id number] Agree with NP Progress Note

## 2016-12-12 NOTE — [facility] Group Notes (Signed)
[facility] LCSW Group Therapy  12/12/2016 12:05 PM  Type of Therapy:  Group Therapy  Participation Level:  Minimal  Participation Quality:  Drowsy  Affect:  Lethargic  Cognitive:  Lacking  Insight:  Limited  Engagement in Therapy:  Lacking  Modes of Intervention:  Discussion, Education, Exploration, Socialization and Support  Summary of Progress/Problems:  Finding Balance in Life. Today's group focused on defining balance in one's own words, identifying things that can knock one off balance, and exploring healthy ways to maintain balance in life. Group members were asked to provide an example of a time when they felt off balance, describe how they handled that situation,and process healthier ways to regain balance in the future. Group members were asked to share the most important tool for maintaining balance that they learned [facility] at [facility] and how they plan to apply this method after discharge.   Tyeisha Dinan N Smart LCSW 12/12/2016, 12:05 PM

## 2016-12-12 NOTE — [facility] Suicide Risk Assessment (Signed)
[facility] INPATIENT:  Family/Significant Other Suicide Prevention Education  Suicide Prevention Education:  Contact Attempts: Kathe Bectonhomas Edwards (pt's friend) [phone] has been identified by the patient as the family member/significant other with whom the patient will be residing, and identified as the person(s) who will aid the patient in the event of a mental health crisis.  With written consent from the patient, two attempts were made to provide suicide prevention education, prior to and/or following the patient's discharge.  We were unsuccessful in providing suicide prevention education.  A suicide education pamphlet was given to the patient to share with family/significant other.  Date and time of first attempt: 12/11/16 at 1:00PM (did not leave message/unsecured line)  Date and time of second attempt: 12/12/16 at 9:40AM   Pulte HomesHeather N Smart LCSW 12/12/2016, 9:41 AM

## 2016-12-13 ENCOUNTER — Encounter (HOSPITAL_COMMUNITY): Payer: Self-pay | Admitting: Behavioral Health

## 2016-12-13 MED ORDER — GABAPENTIN 300 MG PO CAPS: 300.0000 mg | ORAL_CAPSULE | Freq: Two times a day (BID) | ORAL | 0 refills | Status: DC

## 2016-12-13 MED ORDER — CITALOPRAM HYDROBROMIDE 10 MG PO TABS: 10.0000 mg | ORAL_TABLET | Freq: Every day | ORAL | 0 refills | Status: DC

## 2016-12-13 MED ORDER — BENZTROPINE MESYLATE 0.5 MG PO TABS: 0.5000 mg | ORAL_TABLET | Freq: Two times a day (BID) | ORAL | 0 refills | Status: DC

## 2016-12-13 MED ORDER — PALIPERIDONE ER 6 MG PO TB24: 6.0000 mg | ORAL_TABLET | Freq: Every day | ORAL | 0 refills | Status: DC

## 2016-12-13 MED ORDER — TRAZODONE HCL 50 MG PO TABS: 50.0000 mg | ORAL_TABLET | Freq: Every evening | ORAL | 0 refills | Status: DC | PRN

## 2016-12-13 MED ORDER — HYDROXYZINE HCL 25 MG PO TABS: 25.0000 mg | ORAL_TABLET | Freq: Three times a day (TID) | ORAL | 0 refills | Status: DC | PRN

## 2016-12-13 NOTE — Progress Notes (Addendum)
Recreation Therapy Notes  Date: 12/13/2016 Time: 9:30am Location: [address] Dayroom  Group Topic: Stress Management  Goal Area(s) Addresses:  Patient will verbalize importance of using healthy stress management.  Patient will identify positive emotions associated with healthy stress management.   Intervention: Stress Management  Activity :  Meditation. Recreation Therapy Intern introduced the stress management technique of meditation. Recreation Therapy Intern read a script that allowed patients to pay attention to their breathing. Recreation Therapy Intern played zan meditation music. Patients were to follow along as script was read to engage in the activity.  Education: Stress Management, Discharge Planning.   Education Outcome: Needs additional edcuation  Clinical Observations/Feedback: Pt did not attended group.  Marvell Fullerachel Meyer, Recreation Therapy Intern  Caroll RancherMarjette Ksean Vale, LRT/CTRS

## 2016-12-13 NOTE — [facility] Suicide Risk Assessment (Signed)
[facility] Discharge Suicide Risk Assessment   Principal Problem: Schizoaffective Disorder  Discharge Diagnoses:  Patient Active Problem List   Diagnosis Date Noted  . 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 Cannabis use disorder, severe, dependence (HCC) [F12.20] 08/17/2014  . Schizoaffective disorder, bipolar type (HCC) [F25.0] 08/16/2014    Total Time spent with patient: 30 minutes  Musculoskeletal: Strength & Muscle Tone: within normal limits Gait & Station: normal Patient leans: N/A  Psychiatric Specialty Exam: ROS denies headache, no chest pain, no shortness of breath, no vomiting, no diarrhea , no rash   Blood pressure 102/74, pulse (!) 101, temperature 98.4 F (36.9 C), resp. rate 16, height 5\' 10"  (1.778 m), weight 107 kg (236 lb).Body mass index is 33.86 kg/m.  General Appearance: improivng mood  Eye Contact::  improved   Speech:  improved, more verbal409  Volume:  Normal  Mood:  denies feeling depressed, states mood " normal"  Affect:  more reactive  Thought Process:  Linear and Descriptions of Associations: Intact  Orientation:  Other:  fully alert and attentive   Thought Content:  denies hallucinations, no delusions, not internally preoccupied   Suicidal Thoughts:  No denies any suicidal or self injurious ideations, no homicidal or violent ideations   Homicidal Thoughts:  No  Memory:  recent and remote grossly intact   Judgement:  Other:  fair- improved   Insight:  Fair and improved   Psychomotor Activity:  Normal  Concentration:  Good  Recall:  Good  Fund of Knowledge:Good  Language: Good  Akathisia:  Negative  Handed:  Right  AIMS (if indicated):   no abnormal or involuntary movements noted or reported  Assets:  Communication Skills Desire for Improvement Resilience  Sleep:  Number of Hours: 5  Cognition: WNL  ADL's:  Intact   Mental Status Per Nursing Assessment::   On Admission:  Suicidal ideation indicated by patient  Demographic  Factors:  36 year old single male, no children, lives alone, on disability   Loss Factors: Anniversary of a friend's death, had been struggling with insomnia   Historical Factors: History of Schizoaffective Disorder, has had prior psychiatric admissions , is followed by ACT team  Risk Reduction Factors:   Positive social support and Positive coping skills or problem solving skills  Continued Clinical Symptoms:  At this time patient is alert, attentive, improved grooming compared to admission, mood is " better" denies feeling depressed at this time, affect is blunted, but more reactive, and does smile briefly, appropriately at times, no thought disorder, no suicidal or self injurious ideations, no homicidal or violent ideations, no hallucinations, no delusions, future oriented . Behavior on unit calm and in good control. Denies medication side effects  Cognitive Features That Contribute To Risk:  No gross cognitive deficits noted upon discharge. Is alert , attentive, and oriented x 3   Suicide Risk:  Mild:  Suicidal ideation of limited frequency, intensity, duration, and specificity.  There are no identifiable plans, no associated intent, mild dysphoria and related symptoms, good self-control (both objective and subjective assessment), few other risk factors, and identifiable protective factors, including available and accessible social support.  Follow-up Information    [facility] Follow up.   Why:  CSW left message requesting that ACT Team pick up patient on Friday at discharge.  ACT Crisis Line: [phone]. Please resume services with ACTT at discharge. Thank you! Contact information: [address] [place] [address] [phone]  Plan Of Care/Follow-up recommendations:  Activity:  as tolerated  Diet:  Regular Tests:  NA Other:  See below  Patient is leaving unit in good spirits  Plans to return home Follows with ACT team.  Craige CottaFernando A  Cobos, MD 12/13/2016, 12:56 PM

## 2016-12-13 NOTE — Progress Notes (Signed)
Nursing Discharge Note 12/13/2016 [id number]  Data Reports sleeping good with PRN sleep med.  Rates depression 1/10, hopelessness 1/10, and anxiety 1/10. Affect flat.  Denies HI, SI, AVH.  Withdrawn but in milieu.  Received discharge orders.  Action Spoke with patient 1:1, nurse offered support to patient throughout shift..  Reviewed medications, discharge instructions, and follow up appointments with patient. Medication scripts reviewed and given to patient.  Paperwork, AVS, SRA, and transition record handed to patient.   Escorted off of unit at 1320. Belongings returned per belongings form.  Discharged to lobby with bus pass.    Response Verbalized understanding of discharge teaching. Agrees to contact someone or 911 with thoughts/intent to harm self or others.    To follow up per AVS.

## 2016-12-13 NOTE — Progress Notes (Signed)
Patient ID: Ronald ForesterKevin Martinez, male   DOB: 04/01/1981, 36 y.o.   MRN: [id number]   D: Patient has a flat affect on approach. Answered questions without any issue but conversation very minimal. Pacing on the unit tonight with only a small amount of interaction seen between him and peers. Took sleep medication when offered tonight. Contracts for safety and denies any a/v hallucinations tonight. A: Staff will monitor on q 15 minute checks, follow treatment plan, and give medications as ordered. R: Cooperative on the unit.

## 2016-12-13 NOTE — Tx Team (Signed)
Interdisciplinary Treatment and Diagnostic Plan Update  12/13/2016 Time of Session: 0930 AM Ronald ForesterKevin Martinez MRN: [id number]  Principal Diagnosis: Schizoaffective Disorder  Secondary Diagnoses: Active Problems:   Schizoaffective disorder, bipolar type (HCC)   Current Medications:  Current Facility-Administered Medications  Medication Dose Route Frequency Provider Last Rate Last Dose  . acetaminophen (TYLENOL) tablet 650 mg  650 mg Oral Q6H PRN Okonkwo, Justina A, NP      . albuterol (PROVENTIL HFA;VENTOLIN HFA) 108 (90 Base) MCG/ACT inhaler 1-2 puff  1-2 puff Inhalation Q6H PRN Okonkwo, Justina A, NP      . alum & mag hydroxide-simeth (MAALOX/MYLANTA) 200-200-20 MG/5ML suspension 30 mL  30 mL Oral Q4H PRN Okonkwo, Justina A, NP      . benztropine (COGENTIN) tablet 0.5 mg  0.5 mg Oral BID Cobos, Rockey SituFernando A, MD   0.5 mg at 12/13/16 0741  . citalopram (CELEXA) tablet 10 mg  10 mg Oral Daily Cobos, Rockey SituFernando A, MD   10 mg at 12/13/16 0741  . gabapentin (NEURONTIN) capsule 300 mg  300 mg Oral BID Okonkwo, Justina A, NP   300 mg at 12/13/16 0741  . hydrOXYzine (ATARAX/VISTARIL) tablet 25 mg  25 mg Oral TID PRN Beryle Lathekonkwo, Justina A, NP   25 mg at 12/11/16 0106  . magnesium hydroxide (MILK OF MAGNESIA) suspension 30 mL  30 mL Oral Daily PRN Okonkwo, Justina A, NP      . paliperidone (INVEGA) 24 hr tablet 6 mg  6 mg Oral Daily Nwoko, Agnes I, NP   6 mg at 12/13/16 0741  . traZODone (DESYREL) tablet 50 mg  50 mg Oral QHS PRN Cobos, Rockey SituFernando A, MD   50 mg at 12/12/16 2213   PTA Medications: Prescriptions Prior to Admission  Medication Sig Dispense Refill Last Dose  . albuterol (PROVENTIL HFA;VENTOLIN HFA) 108 (90 Base) MCG/ACT inhaler Inhale 1-2 puffs into the lungs every 6 (six) hours as needed for wheezing or shortness of breath. 1 Inhaler 0 EXPIRED at EXPIRED  . benztropine (COGENTIN) 1 MG tablet Take 1 tablet (1 mg total) by mouth daily. (Patient taking differently: Take 1 mg by mouth 2 (two) times  daily. ) 30 tablet 0 Past Week at Unknown time  . buPROPion (WELLBUTRIN XL) 150 MG 24 hr tablet Take 150 mg by mouth every morning.   Past Week at Unknown time  . gabapentin (NEURONTIN) 300 MG capsule Take 1 capsule (300 mg total) by mouth 2 (two) times daily. 60 capsule 0 Past Week at Unknown time  . lamoTRIgine (LAMICTAL) 100 MG tablet Take 100 mg by mouth every morning.   Past Week at Unknown time  . nicotine (NICODERM CQ - DOSED IN MG/24 HOURS) 21 mg/24hr patch Place 1 patch (21 mg total) onto the skin daily. (Patient not taking: Reported on 12/07/2016) 28 patch 0 Not Taking at Unknown time  . paliperidone (INVEGA) 6 MG 24 hr tablet Take 1 tablet (6 mg total) by mouth daily. (Patient not taking: Reported on 12/07/2016) 30 tablet 0 Not Taking at Unknown time  . Paliperidone Palmitate (INVEGA TRINZA) 819 MG/2.625ML SUSP Inject 819 mg into the muscle every 3 (three) months.   09/27/2016 at am  . potassium chloride SA (K-DUR,KLOR-CON) 20 MEQ tablet Take 2 tablets (40 mEq total) by mouth 2 (two) times daily. [facility]  . traZODone (DESYREL) 50 MG tablet Take 1 tablet (50 mg total) by mouth at bedtime and may repeat dose one time if needed. (Patient taking differently:  Take 50 mg by mouth at bedtime. ) 30 tablet 0 Past Week at Unknown time    Patient Stressors: Financial difficulties Medication change or noncompliance Substance abuse  Patient Strengths: Capable of independent living Wellsite geologist fund of knowledge Motivation for treatment/growth Physical Health  Treatment Modalities: Medication Management, Group therapy, Case management,  1 to 1 session with clinician, Psychoeducation, Recreational therapy.   Physician Treatment Plan for Primary Diagnosis: Schizoaffective Disorder  Medication Management: Evaluate patient's response, side effects, and tolerance of medication regimen.  Therapeutic Interventions: 1 to 1 sessions, Unit Group sessions and Medication  administration.  Evaluation of Outcomes: Adequate for discharge   Physician Treatment Plan for Secondary Diagnosis: Active Problems:   Schizoaffective disorder, bipolar type (HCC)    Medication Management: Evaluate patient's response, side effects, and tolerance of medication regimen.  Therapeutic Interventions: 1 to 1 sessions, Unit Group sessions and Medication administration.  Evaluation of Outcomes: Adequate for discharge    RN Treatment Plan for Primary Diagnosis:Schizoaffective Disorder  Long Term Goal(s): Knowledge of disease and therapeutic regimen to maintain health will improve  Short Term Goals: Ability to remain free from injury will improve, Ability to disclose and discuss suicidal ideas and Ability to identify and develop effective coping behaviors will improve  Medication Management: RN will administer medications as ordered by provider, will assess and evaluate patient's response and provide education to patient for prescribed medication. RN will report any adverse and/or side effects to prescribing provider.  Therapeutic Interventions: 1 on 1 counseling sessions, Psychoeducation, Medication administration, Evaluate responses to treatment, Monitor vital signs and CBGs as ordered, Perform/monitor CIWA, COWS, AIMS and Fall Risk screenings as ordered, Perform wound care treatments as ordered.  Evaluation of Outcomes: Adequate for discharge    LCSW Treatment Plan for Primary Diagnosis: Schizoaffective Disorder Long Term Goal(s): Safe transition to appropriate next level of care at discharge, Engage patient in therapeutic group addressing interpersonal concerns.  Short Term Goals: Engage patient in aftercare planning with referrals and resources, Facilitate patient progression through stages of change regarding substance use diagnoses and concerns and Identify triggers associated with mental health/substance abuse issues  Therapeutic Interventions: Assess for all discharge  needs, 1 to 1 time with Social worker, Explore available resources and support systems, Assess for adequacy in community support network, Educate family and significant other(s) on suicide prevention, Complete Psychosocial Assessment, Interpersonal group therapy.  Evaluation of Outcomes: Adequate for discharge    Progress in Treatment: Attending groups: Yes Participating in groups: Minimally, when he attends  Taking medication as prescribed: Yes. Toleration medication: Yes. Family/Significant other contact made: Contact attempts made with pt's friend. SPE completed with pt. Contact also made with his ACT Team.  Patient understands diagnosis: Yes. Discussing patient identified problems/goals with staff: Yes. Medical problems stabilized or resolved: Yes. Denies suicidal/homicidal ideation: Yes, per self report.  Issues/concerns per patient self-inventory: No. Other: n/a   New problem(s) identified: No, Describe:  n/a  New Short Term/Long Term Goal(s): elimination of SI/AH, development of comprehensive mental wellness plan, medication management for mood stabilization.   Discharge Plan or Barriers: Pt is followed by [facility]. He plans to return home; follow-up with ACT Team. [facility] pamphlet provided as well and pt spoke with Peer support specialist while inpatient.   Reason for Continuation of Hospitalization: none  Estimated Length of Stay:Friday 12/13/16  Attendees: Patient: 12/13/2016 9:32 AM  Physician: Dr. Lucianne Muss MD 12/13/2016 9:32 AM  Nursing:  12/13/2016 9:32 AM  RN Care Manager: Nicolasa Ducking CM 12/13/2016  9:32 AM  Social Worker: [employer], LCSW 12/13/2016 9:32 AM  Recreational Therapist: x 12/13/2016 9:32 AM  Other: Armandina Stammer NP; Denzil Magnuson NP 12/13/2016 9:32 AM  Other:  12/13/2016 9:32 AM  Other: 12/13/2016 9:32 AM    Scribe for Treatment Team: Ledell Peoples Smart, LCSW 12/13/2016 9:32 AM

## 2016-12-13 NOTE — Progress Notes (Signed)
  [facility]  Will you be returning to the same living situation after discharge:  Yes,  home At discharge, do you have transportation home?: Yes,  bus or ACTT team Do you have the ability to pay for your medications: Yes,  medicaid  Release of information consent forms completed and submitted to medical records by CSW.  Patient to Follow up at: Follow-up Information    [facility] Follow up.   Why:  CSW left message requesting that ACT Team pick up patient on Friday at discharge.  ACT Crisis Line: [phone]. Please resume services with ACTT at discharge. Thank you! Contact information: [address] [place] [id number] [phone]           Next level of care [facility]no  Safety Planning and Suicide Prevention discussed: Yes,  SPE completed with pt. Contact attempts made with pt's friend. [facility] pamphlet and Mobile Crisis information provided.   Have you used any form of tobacco in the last 30 days? (Cigarettes, Smokeless Tobacco, Cigars, and/or Pipes): Yes  Has patient been referred to the Quitline?: Patient refused referral  Patient has been referred for addiction treatment: Yes  Charlei Ramsaran N Smart LCSW 12/13/2016, 9:26 AM

## 2016-12-13 NOTE — Progress Notes (Signed)
CSW contacted ACT team to notify them of patient's discharge. They will meet with patient when he gets home.   Trula SladeHeather Smart, MSW, LCSW Clinical Social Worker 12/13/2016 1:17 PM

## 2016-12-13 NOTE — Discharge Summary (Signed)
Physician Discharge Summary Note  Patient:  Ronald Martinez is an 36 y.o., male MRN:  [id number] DOB:  [birth year] Patient phone:  [phone] (home)  Patient address:   [address] [place] [address],  Total Time spent with patient: 30 minutes  Date of Admission:  12/09/2016 Date of Discharge: 12/13/2016  Reason for Admission: Depression, SI   Principal Problem: Schizoaffective disorder, bipolar type [facility]) Discharge Diagnoses: Patient Active Problem List   Diagnosis Date Noted  . 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 Cannabis use disorder, severe, dependence (HCC) [F12.20] 08/17/2014  . Schizoaffective disorder, bipolar type (HCC) [F25.0] 08/16/2014    Past Psychiatric History: Schizoaffective disorder, Benzodiazepine use disorder, Cannabis use disorder.  Past Medical History:  Past Medical History:  Diagnosis Date  . Asthma   . Bipolar 1 disorder (HCC)   . Depression   . Schizophrenia (HCC)    History reviewed. No pertinent surgical history. Family History:  Family History  Problem Relation Age of Onset  . Asthma Mother   . Alcoholism Other    Family Psychiatric  History: Reviewed. None noted in chart.  Social History:  History  Alcohol Use  . Yes     History  Drug Use  . Types: Marijuana, Benzodiazepines    Social History   Social History  . Marital status: Single    Spouse name: N/A  . Number of children: N/A  . Years of education: N/A   Social History Main Topics  . Smoking status: Current Some Day Smoker    Packs/day: 1.00    Types: Cigarettes  . Smokeless tobacco: Never Used  . Alcohol use Yes  . Drug use: Yes    Types: Marijuana, Benzodiazepines  . Sexual activity: No   Other Topics Concern  . None   Social History Narrative  . None    Hospital Course:  This is one of numerous admission assessments in this hospital alone for Jaxan, a 36 year old AA male with hx of mental illness. He is known to this  unit from previous psychiatric hospitalizations. He is currently on Invega shots every 3 months & [facility] is in charge of his mental health care. He is also on other psychotropic medications including Neurontin routinely. During this assessment, Kishan reports, "My friend dropped me off at the [facility] last Saturday. I was not getting any sleep for over 2 weeks. Then, my mental health & depression started to act up. I started to get more depressed & became suicidal with a plan to jump off a building & die. The suicidal thoughts started 2 weeks ago. I did not hurt myself, but had in 2005 when I overdosed on Seroquel & was put in the hospital. My mental illness started when I was a child. Mental illness runs in my family. Everyone in my family has some kind of mental illness, including my great-grandfathers. My mama is an alcoholic. I have been on mental health medications until a week ago when I stopped taking them. They were not working. But the Neurontin works. The Lamictal, Wellbutrin, Cogentin & Trazodone did not work.   After the above admission assessment, Jeramia's presenting symptoms were identified. His UDS was positive for THC. detoxification treatments administered as apprproiate. He was medicated & discharged on; Celexa 10 mgrs QDAY and titrate gradually as tolerated, Neurontin at 300 mgrs BID, Invega 6 mg po daily for Schizoaffective disorder, cogentin 0.5 mg po bid for drug induced EPS, Vistaril  25 mg po Q6hrs for anxiety/insomnia. He was enrolled & actively  participated in the group counseling sessions. He was able to verbalize coping skills that should help him cope better to maintain depression/mood stability upon returning home.  During the course of his hospitalization, Dewaine's improvement was monitored by observation and his daily report of symptom reduction. Evidence was further noted by  presentation of good affect and improved mood & behavior.  He tolerated his treatment  regimen without any adverse effects reported.  Sebastin's case was presented during treatment team meeting this morning. The team members all agreed that Caryn BeeKevin was both mentally & medically stable to be discharged to continue mental health care on an outpatient basis as noted below. He was provided with all the necessary information needed to make this appointment without problems.  Upon discharge, Caryn BeeKevin denied any SIHI, AVH, delusional thoughts or paranoia. He was provided with a  prescription [facility] to be [facility] to his local pharmacy. [facility] in no apparent distress. Transportation per his arrangement.  Physical Findings: AIMS: Facial and Oral Movements Muscles of Facial Expression: None, normal Lips and Perioral Area: None, normal Jaw: None, normal Tongue: None, normal,Extremity Movements Upper (arms, wrists, hands, fingers): None, normal Lower (legs, knees, ankles, toes): None, normal, Trunk Movements Neck, shoulders, hips: None, normal, Overall Severity Severity of abnormal movements (highest score from questions above): None, normal Incapacitation due to abnormal movements: None, normal Patient's awareness of abnormal movements (rate only patient's report): No Awareness, Dental Status Current problems with teeth and/or dentures?: No Does patient usually wear dentures?: No  CIWA:    COWS:     Musculoskeletal: Strength & Muscle Tone: within normal limits Gait & Station: normal Patient leans: N/A  Psychiatric Specialty Exam: SEES RA BY MD Physical Exam  Nursing note and vitals reviewed. Constitutional: He is oriented to person, place, and time.  Neurological: He is alert and oriented to person, place, and time.    Review of Systems  Psychiatric/Behavioral: Positive for substance abuse (Hx of substance abuse ). Negative for hallucinations, memory loss and suicidal ideas. Depression: improved. Nervous/anxious: impproved.  Insomnia: improved.   All other systems reviewed and are negative.   Blood pressure 102/74, pulse (!) 101, temperature 98.4 F (36.9 C), resp. rate 16, height 5\' 10"  (1.778 m), weight 236 lb (107 kg).Body mass index is 33.86 kg/m.   Have you used any form of tobacco in the last 30 days? (Cigarettes, Smokeless Tobacco, Cigars, and/or Pipes): Yes  Has this patient used any form of tobacco in the last 30 days? (Cigarettes, Smokeless Tobacco, Cigars, and/or Pipes)  Yes, A prescription for an FDA-approved tobacco cessation medication was offered at discharge and the patient refused  Blood Alcohol level:  Lab Results  Component Value Date   [facility] 12/07/2016   ETH <5 01/08/2016    Metabolic Disorder Labs:  Lab Results  Component Value Date   HGBA1C 5.7 (H) 12/11/2016   MPG 117 12/11/2016   MPG 114 01/10/2016   Lab Results  Component Value Date   PROLACTIN 23.9 (H) 12/11/2016   PROLACTIN 15.7 (H) 01/09/2016   Lab Results  Component Value Date   CHOL 184 12/11/2016   TRIG 65 12/11/2016   HDL 59 12/11/2016   CHOLHDL 3.1 12/11/2016   VLDL 13 12/11/2016   LDLCALC 112 (H) 12/11/2016   LDLCALC 102 (H) 01/10/2016    See Psychiatric Specialty Exam and Suicide Risk Assessment completed by  Attending Physician prior to discharge.  Discharge destination:  Home  Is patient on multiple antipsychotic therapies at discharge:  No   Has Patient had three or more failed trials of antipsychotic monotherapy by history:  No  Recommended Plan for Multiple Antipsychotic Therapies: NA   Allergies as of 12/13/2016      Reactions   Salami [pickled Meat] Nausea And Vomiting   Allergic to bologna and salami      Medication List    STOP taking these medications   buPROPion 150 MG 24 hr tablet Commonly known as:  WELLBUTRIN XL   INVEGA TRINZA 819 MG/2.625ML Susp Generic drug:  Paliperidone Palmitate   lamoTRIgine 100 MG tablet Commonly known as:  LAMICTAL   nicotine 21 mg/24hr  patch Commonly known as:  NICODERM CQ - dosed in mg/24 hours   potassium chloride SA 20 MEQ tablet Commonly known as:  K-DUR,KLOR-CON     TAKE these medications     Indication  albuterol 108 (90 Base) MCG/ACT inhaler Commonly known as:  PROVENTIL HFA;VENTOLIN HFA Inhale 1-2 puffs into the lungs every 6 (six) hours as needed for wheezing or shortness of breath.  Indication:  Asthma   benztropine 0.5 MG tablet Commonly known as:  COGENTIN Take 1 tablet (0.5 mg total) by mouth 2 (two) times daily. What changed:  medication strength  how much to take  when to take this  Indication:  Extrapyramidal Reaction caused by Medications   citalopram 10 MG tablet Commonly known as:  CELEXA Take 1 tablet (10 mg total) by mouth daily.  Indication:  Depression   gabapentin 300 MG capsule Commonly known as:  NEURONTIN Take 1 capsule (300 mg total) by mouth 2 (two) times daily.  Indication:  Agitation, anxiety   hydrOXYzine 25 MG tablet Commonly known as:  ATARAX/VISTARIL Take 1 tablet (25 mg total) by mouth 3 (three) times daily as needed for anxiety.  Indication:  Anxiety Neurosis (Inactive)   paliperidone 6 MG 24 hr tablet Commonly known as:  INVEGA Take 1 tablet (6 mg total) by mouth daily.  Indication:  Schizoaffective Disorder   traZODone 50 MG tablet Commonly known as:  DESYREL Take 1 tablet (50 mg total) by mouth at bedtime as needed for sleep. What changed:  when to take this  reasons to take this  Indication:  Aggressive Behavior, Trouble Sleeping      Follow-up Information    [facility] Follow up.   Why:  CSW left message requesting that ACT Team pick up patient on Friday at discharge.  ACT Crisis Line: [phone]. Please resume services with [facility] at discharge. Thank you! Contact information: [address] [place] [address] [phone]           Follow-up recommendations:  Follow up with your outpatient provided for any  medical issues. Activity & diet as recommended by your primary care provider.  Comments:  Patient is instructed prior to discharge to: Take all medications as prescribed by his/her mental healthcare provider. Report any adverse effects and or reactions from the medicines to his/her outpatient provider promptly. Patient has been instructed & cautioned: To not engage in alcohol and or illegal drug use while on prescription medicines. In the event of worsening symptoms, patient is instructed to call the crisis hotline, 911 and or go to the nearest ED for appropriate evaluation and treatment of symptoms. To follow-up with his/her primary care provider for your other medical issues, concerns and or health care needs.  Signed: Garlan Fillers  Maisie Fus, NP 12/13/2016, 2:42 PM   Patient seen, Suicide Assessment Completed.  Disposition Plan Reviewed

## 2017-06-11 ENCOUNTER — Emergency Department (HOSPITAL_COMMUNITY): Payer: Medicaid Other

## 2017-06-11 ENCOUNTER — Other Ambulatory Visit: Payer: Self-pay

## 2017-06-11 ENCOUNTER — Encounter (HOSPITAL_COMMUNITY): Payer: Self-pay | Admitting: *Deleted

## 2017-06-11 ENCOUNTER — Emergency Department (HOSPITAL_COMMUNITY)
Admission: EM | Admit: 2017-06-11 | Discharge: 2017-06-11 | Disposition: A | Discharge status: Home / Self Care | Payer: Medicaid Other | Attending: Emergency Medicine | Admitting: Emergency Medicine

## 2017-06-11 DIAGNOSIS — J189 Pneumonia, unspecified organism: Secondary | ICD-10-CM | POA: Insufficient documentation

## 2017-06-11 DIAGNOSIS — F17210 Nicotine dependence, cigarettes, uncomplicated: Secondary | ICD-10-CM | POA: Insufficient documentation

## 2017-06-11 DIAGNOSIS — Z79899 Other long term (current) drug therapy: Secondary | ICD-10-CM | POA: Diagnosis not present

## 2017-06-11 DIAGNOSIS — J988 Other specified respiratory disorders: Secondary | ICD-10-CM

## 2017-06-11 DIAGNOSIS — R05 Cough: Secondary | ICD-10-CM | POA: Diagnosis present

## 2017-06-11 DIAGNOSIS — J45909 Unspecified asthma, uncomplicated: Secondary | ICD-10-CM | POA: Insufficient documentation

## 2017-06-11 DIAGNOSIS — J181 Lobar pneumonia, unspecified organism: Secondary | ICD-10-CM

## 2017-06-11 MED ORDER — PREDNISONE 10 MG PO TABS: 20.0000 mg | ORAL_TABLET | Freq: Two times a day (BID) | ORAL | 0 refills | Status: AC

## 2017-06-11 MED ORDER — PREDNISONE 20 MG PO TABS
60.0000 mg | ORAL_TABLET | Freq: Once | ORAL | Status: AC
  Administered 2017-06-11: 60 mg via ORAL
  Filled 2017-06-11: qty 3

## 2017-06-11 MED ORDER — DOXYCYCLINE HYCLATE 100 MG PO CAPS: 100.0000 mg | ORAL_CAPSULE | Freq: Two times a day (BID) | ORAL | 0 refills | Status: DC

## 2017-06-11 MED ORDER — AMOXICILLIN-POT CLAVULANATE ER 1000-62.5 MG PO TB12: 1.0000 | ORAL_TABLET | Freq: Two times a day (BID) | ORAL | 0 refills | Status: AC

## 2017-06-11 MED ORDER — IPRATROPIUM-ALBUTEROL 0.5-2.5 (3) MG/3ML IN SOLN
3.0000 mL | Freq: Once | RESPIRATORY_TRACT | Status: AC
  Administered 2017-06-11: 3 mL via RESPIRATORY_TRACT
  Filled 2017-06-11: qty 3

## 2017-06-11 MED ORDER — LEVOFLOXACIN 750 MG PO TABS
750.0000 mg | ORAL_TABLET | Freq: Once | ORAL | Status: AC
  Administered 2017-06-11: 750 mg via ORAL
  Filled 2017-06-11: qty 1

## 2017-06-11 MED ORDER — ALBUTEROL SULFATE HFA 108 (90 BASE) MCG/ACT IN AERS
1.0000 | INHALATION_SPRAY | Freq: Four times a day (QID) | RESPIRATORY_TRACT | 0 refills | Status: DC | PRN
Start: 2017-06-11 — End: 2017-08-20

## 2017-06-11 NOTE — Discharge Instructions (Addendum)
Please read and follow all provided instructions.  You have been diagnosed today with Pneumonia   Tests performed today include: Blood counts and electrolytes Chest x-ray -- shows pneumonia Vital signs. See below for your results today.   Take medications as prescribed. Please take all of your antibiotics until finished! You may develop abdominal discomfort or diarrhea from the antibiotic.  You may help offset this with probiotics which you can buy or get in yogurt. Do not eat or take the probiotics until 2 hours after your antibiotic. Do not take your medicine if develop an itchy rash, swelling in your mouth or lips, or difficulty breathing. Follow-up with your doctor in the next week in regards to your hospital visit. If you do not have a doctor use the resource guide listed below to help you find one. You may return to the emergency department if symptoms worsen, become progressive, or become more concerning.  Please not that doxycycline can cause sensitivity to the sun.   What is Pneumonia? - Pneumonia is an infection of the lungs.  CAUSES Pneumonia may be caused by bacteria or a virus. Usually, these infections are caused by breathing infectious particles into the lungs (respiratory tract). SYMPTOMS  Cough.  Fever (>100.4) Chest pain.  Increased rate of breathing.  Wheezing.  Mucus production.  DIAGNOSIS  If you have the common symptoms of pneumonia, your caregiver will typically confirm the diagnosis with a chest X-ray. The X-ray will show an abnormality in the lung (pulmonary infiltrate) if you have pneumonia. Other tests of your blood, urine, or sputum may be done to find the specific cause of your pneumonia. Your caregiver may also do tests (blood gases or pulse oximetry) to see how well your lungs are working. TREATMENT  Some forms of pneumonia may be spread to other people when you cough or sneeze. You may be asked to wear a mask before and during your exam. Pneumonia that is  caused by bacteria is treated with antibiotic medicine. Pneumonia that is caused by the influenza virus may be treated with an antiviral medicine. Take medication as prescribed.  PREVENTION A pneumococcal shot (vaccine) is available to prevent a common bacterial cause of pneumonia. This is usually suggested for: People over 43 years old.  Patients on chemotherapy.  People with chronic lung problems, such as bronchitis or emphysema.  People with immune system problems.  If you are over 65 or have a high-risk condition, you may receive the pneumococcal vaccine if you have not received it before. A routine influenza vaccine is also recommended. This vaccine can help prevent some cases of pneumonia If you smoke, it is time to quit. You may receive instructions and counseling on how to stop smoking.  HOME CARE INSTRUCTIONS  Cough suppressants may be used if you are losing too much rest. However, coughing protects you by clearing your lungs. You should avoid using cough suppressants if you can.  Your caregiver may have prescribed medicine if he or she thinks your pneumonia is caused by a bacteria or influenza. Finish your medicine even if you start to feel better.  Your caregiver may also prescribe an expectorant. This loosens the mucus to be coughed up.  Only take over-the-counter or prescription medicines for pain, discomfort, or fever as directed by your caregiver.  Do not smoke. Smoking is a common cause of bronchitis and can contribute to pneumonia. If you are a smoker and continue to smoke, your cough may last several weeks after your pneumonia has  cleared.  A cold steam vaporizer or humidifier in your room or home may help loosen mucus.  Coughing is often worse at night. Sleeping in a semi-upright position in a recliner or using a couple pillows under your head will help with this.  Get rest as you feel it is needed. Your body will usually let you know when you need to rest.  SEEK IMMEDIATE  MEDICAL CARE IF:  Your illness becomes worse. This is especially true if you are elderly or weakened from any other disease.  You cannot control your cough with suppressants and are losing sleep.  You begin coughing up blood.  You develop pain which is getting worse or is uncontrolled with medicines.  You have a fever (100.79F).  Any of the symptoms which initially brought you in for treatment are getting worse rather than better.  You develop shortness of breath or chest pain.  Additional Information:  Your vital signs today were: BP 131/83 (BP Location: Right Arm)    Pulse (!) 106    Temp 98.7 F (37.1 C) (Oral)    Resp 18    Ht 5\' 11"  (1.803 m)    Wt 104.3 kg (230 lb)    SpO2 100%    BMI 32.08 kg/m  If your blood pressure (BP) was elevated above 135/85 this visit, please have this repeated by your doctor within one month. ---------------

## 2017-06-11 NOTE — ED Triage Notes (Signed)
To ED for eval of coughing sneezing over the past 5 days. States he sometimes will get pain in head when coughing - does not stay only with coughing. Cough is productive- described as clear. Hx of asthma- has inhalers he is using at home. Pt talking in full sentences. Appears in nad. Sound nasally congested.

## 2017-06-11 NOTE — ED Notes (Signed)
Walked patient up the hall with pulse oxy patient did well stayed at 100 on room air it went down to 97 room air patient heart rate stayed in the 120's patient back in room with call bell in reach

## 2017-06-11 NOTE — ED Provider Notes (Signed)
[facility]   CSN: [id number] Arrival date & time: 06/11/17  1019     History   Chief Complaint Chief Complaint  Patient presents with  . Cough    HPI Ronald Martinez is a 37 y.o. male with a past medical history of of asthma who presents the emergency department today for cough.  Patient states that over the last 5 days he has had a productive cough with clear sputum with associated sneezing, itchy watery eyes, rhinorrhea and  congestion.  He notes that over the last 2 days he has become short of breath and wheezing.  He is out of all asthma medications at home.  He has not taking anything for this. Denies fever, chills, myalgia's, arthralgia's, night sweats, weight loss, travel, chest pain, DOE, hemoptysis, sinus pain or sore throat. Unsure of sick contacts. Patient is a one pack a day smoker. No new medications or use of ACE-I. No relation to food.  He is unsure if he is ever been hospitalized for asthma.  No prior intubations for asthma.  No history of COPD, or CHF.   HPI  Past Medical History:  Diagnosis Date  . Asthma   . Bipolar 1 disorder (HCC)   . Depression   . Schizophrenia [facility](HCC)     Patient Active Problem List   Diagnosis Date Noted  . 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 Cannabis use disorder, severe, dependence (HCC) 08/17/2014  . Schizoaffective disorder, bipolar type (HCC) 08/16/2014    History reviewed. No pertinent surgical history.     Home Medications    Prior to Admission medications   Medication Sig Start Date End Date Taking? Authorizing Provider  albuterol (PROVENTIL HFA;VENTOLIN HFA) 108 (90 Base) MCG/ACT inhaler Inhale 1-2 puffs into the lungs every 6 (six) hours as needed for wheezing or shortness of breath. 05/06/16  Yes Mackuen, Courteney Lyn, MD  benztropine (COGENTIN) 0.5 MG tablet Take 1 tablet (0.5 mg total) by mouth 2 (two) times daily. 12/13/16  Yes Denzil Magnusonhomas, Lashunda, NP    citalopram (CELEXA) 10 MG tablet Take 1 tablet (10 mg total) by mouth daily. 12/14/16  Yes Denzil Magnusonhomas, Lashunda, NP  gabapentin (NEURONTIN) 300 MG capsule Take 1 capsule (300 mg total) by mouth 2 (two) times daily. 12/13/16  Yes Denzil Magnusonhomas, Lashunda, NP  hydrOXYzine (ATARAX/VISTARIL) 25 MG tablet Take 1 tablet (25 mg total) by mouth 3 (three) times daily as needed for anxiety. 12/13/16  Yes Denzil Magnusonhomas, Lashunda, NP  paliperidone (INVEGA) 6 MG 24 hr tablet Take 1 tablet (6 mg total) by mouth daily. 12/14/16  Yes Denzil Magnusonhomas, Lashunda, NP  traZODone (DESYREL) 50 MG tablet Take 1 tablet (50 mg total) by mouth at bedtime as needed for sleep. 12/13/16  Yes Denzil Magnusonhomas, Lashunda, NP    Family History Family History  Problem Relation Age of Onset  . Asthma Mother   . Alcoholism Other     Social History Social History   Tobacco Use  . Smoking status: Current Some Day Smoker    Packs/day: 1.00    Types: Cigarettes  . Smokeless tobacco: Never Used  Substance Use Topics  . Alcohol use: Yes  . Drug use: Yes    Types: Marijuana, Benzodiazepines     Allergies   Salami [pickled meat]   Review of Systems Review of Systems  All other systems reviewed and are negative.    Physical Exam Updated Vital Signs BP 131/83 (BP Location: Right Arm)   Pulse (!) 106  Temp 98.7 F (37.1 C) (Oral)   Resp 18   Ht 5\' 11"  (1.803 m)   Wt 104.3 kg (230 lb)   SpO2 100%   BMI 32.08 kg/m   Physical Exam  Constitutional: He appears well-developed and well-nourished.  HENT:  Head: Normocephalic and atraumatic.  Right Ear: Tympanic membrane, external ear and ear canal normal.  Left Ear: Tympanic membrane, external ear and ear canal normal.  Nose: Mucosal edema and rhinorrhea present. Right sinus exhibits no maxillary sinus tenderness and no frontal sinus tenderness. Left sinus exhibits no maxillary sinus tenderness and no frontal sinus tenderness.  Mouth/Throat: Uvula is midline, oropharynx is clear and moist and mucous  membranes are normal. No tonsillar exudate.  The patient has normal phonation and is in control of secretions. No stridor.  Midline uvula without edema. Soft palate rises symmetrically.  No tonsillar erythema or exudates. No PTA. Cobblestoning. Tongue protrusion is normal. No trismus. No creptius on neck palpation and patient has good dentition. No gingival erythema or fluctuance noted. Mucus membranes moist.   Eyes: Pupils are equal, round, and reactive to light. Right eye exhibits no discharge. Left eye exhibits no discharge. No scleral icterus.  Neck: Trachea normal. Neck supple. No spinous process tenderness present. No neck rigidity. Normal range of motion present.  No nuchal rigidity or meningismus  Cardiovascular: Normal rate, regular rhythm and intact distal pulses.  No murmur heard. Pulses:      Radial pulses are 2+ on the right side, and 2+ on the left side.       Dorsalis pedis pulses are 2+ on the right side, and 2+ on the left side.       Posterior tibial pulses are 2+ on the right side, and 2+ on the left side.  No lower extremity swelling or edema. Calves symmetric in size bilaterally.  Pulmonary/Chest: Effort normal. No accessory muscle usage. No tachypnea. No respiratory distress. He has wheezes. He has rhonchi. He exhibits no tenderness.  Speaking full sentences without difficulty.  Symmetric rise and fall of the chest.  Abdominal: Soft. Bowel sounds are normal. There is no tenderness. There is no rebound and no guarding.  Musculoskeletal: He exhibits no edema.  Lymphadenopathy:    He has no cervical adenopathy.  Neurological: He is alert.  Skin: Skin is warm and dry. No rash noted. He is not diaphoretic.  Psychiatric: He has a normal mood and affect.  Nursing note and vitals reviewed.    ED Treatments / Results  Labs (all labs ordered are listed, but only abnormal results are displayed) Labs Reviewed - No data to display  EKG  EKG Interpretation None        Radiology Dg Chest 2 View  Result Date: 06/11/2017 CLINICAL DATA:  Worsening cough and chest congestion for the past 5 days. Current smoker. EXAM: CHEST  2 VIEW COMPARISON:  Portable chest x-ray of December 07, 2016 FINDINGS: The lungs are adequately inflated. There are patchy airspace opacities at both lung bases which are new. The heart and pulmonary vascularity are normal. There is no pleural effusion. There is stable dextrocurvature centered in the midthoracic spine. IMPRESSION: Patchy pneumonia at both lung bases which is new. Followup PA and lateral chest X-ray is recommended in 3-4 weeks following trial of antibiotic therapy to ensure resolution. Electronically Signed   By: David  [place] M.D.   On: 06/11/2017 13:20    Procedures Procedures (including critical care time)  Medications Ordered in ED Medications  ipratropium-albuterol (DUONEB)  0.5-2.5 (3) MG/3ML nebulizer solution 3 mL (3 mLs Nebulization Given 06/11/17 1251)  predniSONE (DELTASONE) tablet 60 mg (60 mg Oral Given 06/11/17 1251)  ipratropium-albuterol (DUONEB) 0.5-2.5 (3) MG/3ML nebulizer solution 3 mL (3 mLs Nebulization Given 06/11/17 1427)  levofloxacin (LEVAQUIN) tablet 750 mg (750 mg Oral Given 06/11/17 1427)     Initial Impression / Assessment and Plan / ED Course  I have reviewed the triage vital signs and the nursing notes.  Pertinent labs & imaging results that were available during my care of the patient were reviewed by me and considered in my medical decision making (see chart for details).       37 y.o. male with a past medical history of of asthma who presents the emergency department today for cough.  Patient states that over the last 5 days he has had a productive cough with clear sputum with associated sneezing, itchy watery eyes, rhinorrhea and  congestion.  He notes that over the last 2 days he has become short of breath and wheezing.  He is out of all asthma medications at home. No fevers, body aches, chest  pain.  On presentation the patient is without fever, tachypnea or hypoxia.  He does have mucosal edema bilaterally and rhinorrhea.  Oropharyngeal exam is benign.  No meningeal signs.  Heart is without any murmur.  Patient does have coarse rhonchi and wheezing throughout.  There is no increased work of breathing.  He is without tachypnea, accessory muscle use, pursed lip breathing.  He is able to speak in full sentences without difficulty.  Will obtain a chest x-ray.  Will give breathing treatment and prednisone and reevaluate.  After first breathing treatment patient's lung sounds improved.  He does appear to have some rales of the left lower lung and still with wheezing throughout.  Will give second round of breathing treatment and reevaluate.  Chest x-ray shows pneumonia of the lower lung fields.  Will give levofloxacin in the department due to the patient's underlying asthma that is likely exacerbated due to patient's pneumonia.  Gave patient albuterol inhaler in the department. Patient ambulated in ED with O2 saturations maintained >90, no current signs of respiratory distress. Lung exam improved after second nebulizer treatment. Prednisone given in the ED and pt will be discharged with 5 day burst. Pt states they are breathing at baseline.  There is no increased work of breathing.  He is remained afebrile and without hypoxia in the department.  Patient is to follow with PCP in regards to today's visit.  He was given strict return precautions for reasons to return.  His medications were switched from levofloxacin to Augmentin and Doxycycline due to drug interaction with patient's home medications.  Patient given supportive care for other symptoms.  Patient appears safe for discharge.  Final Clinical Impressions(s) / ED Diagnoses   Final diagnoses:  Community acquired pneumonia of left lower lobe of lung (HCC)  Wheezing-associated respiratory infection (WARI)    ED Discharge Orders        Ordered     predniSONE (DELTASONE) 10 MG tablet  2 times daily with meals     06/11/17 1539    albuterol (PROVENTIL HFA;VENTOLIN HFA) 108 (90 Base) MCG/ACT inhaler  Every 6 hours PRN     06/11/17 1539    amoxicillin-clavulanate (AUGMENTIN XR) 1000-62.5 MG 12 hr tablet  2 times daily     06/11/17 1539    doxycycline (VIBRAMYCIN) 100 MG capsule  2 times daily  06/11/17 1539       Jacinto Halim, PA-C 06/11/17 1609    Rolland Porter, MD 06/12/17 [id number]

## 2017-06-23 IMAGING — DX DG CHEST 2V
2 series · 2 of 2 positions shown · non-contrast
Comparison: 03/05/2015 and earlier.

CLINICAL DATA: 35-year-old male with cough and wheezing for 3
weeks. Auditory hallucinations and suicidal ideation. Initial
encounter.

EXAM:
CHEST  2 VIEW

[chest pa]
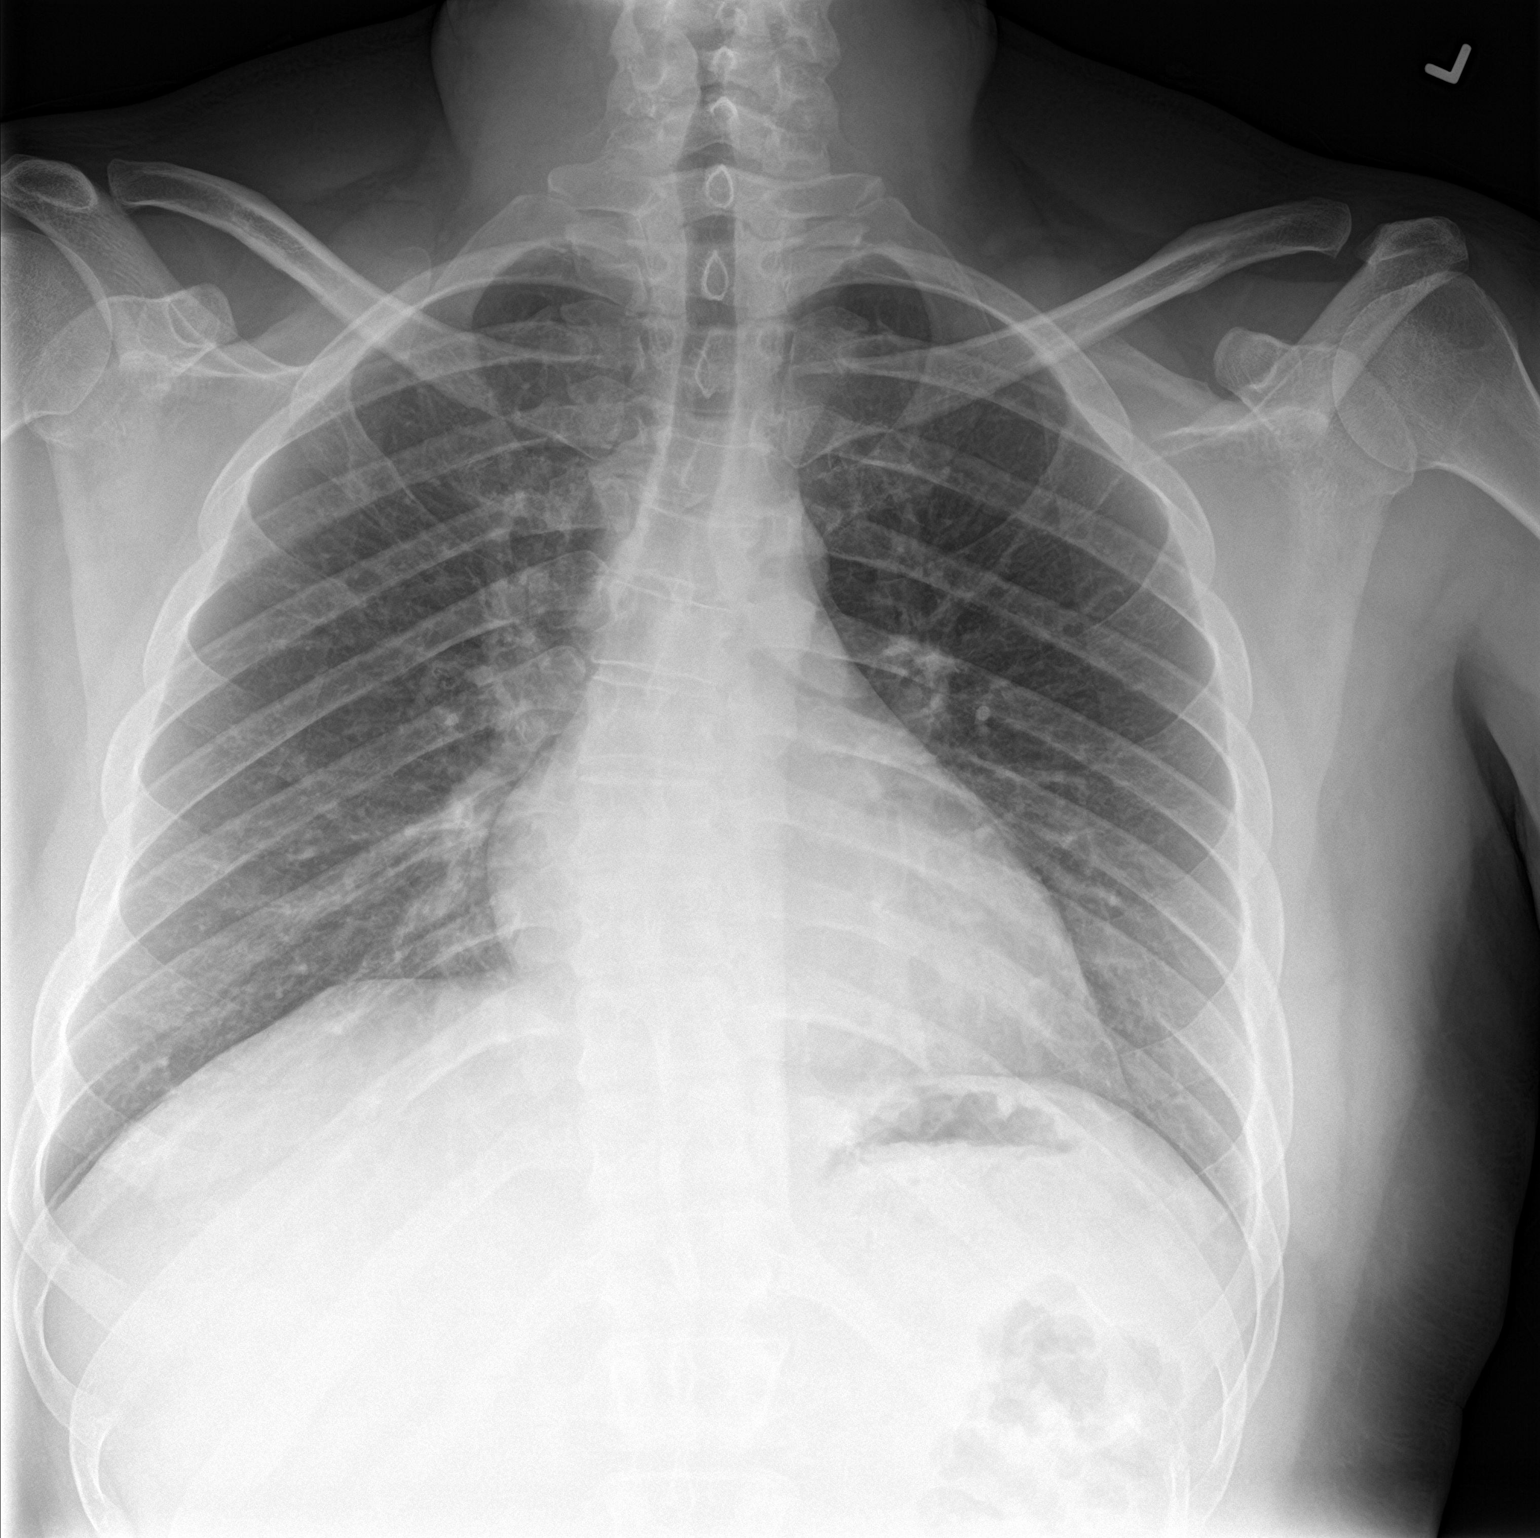

[chest lat]
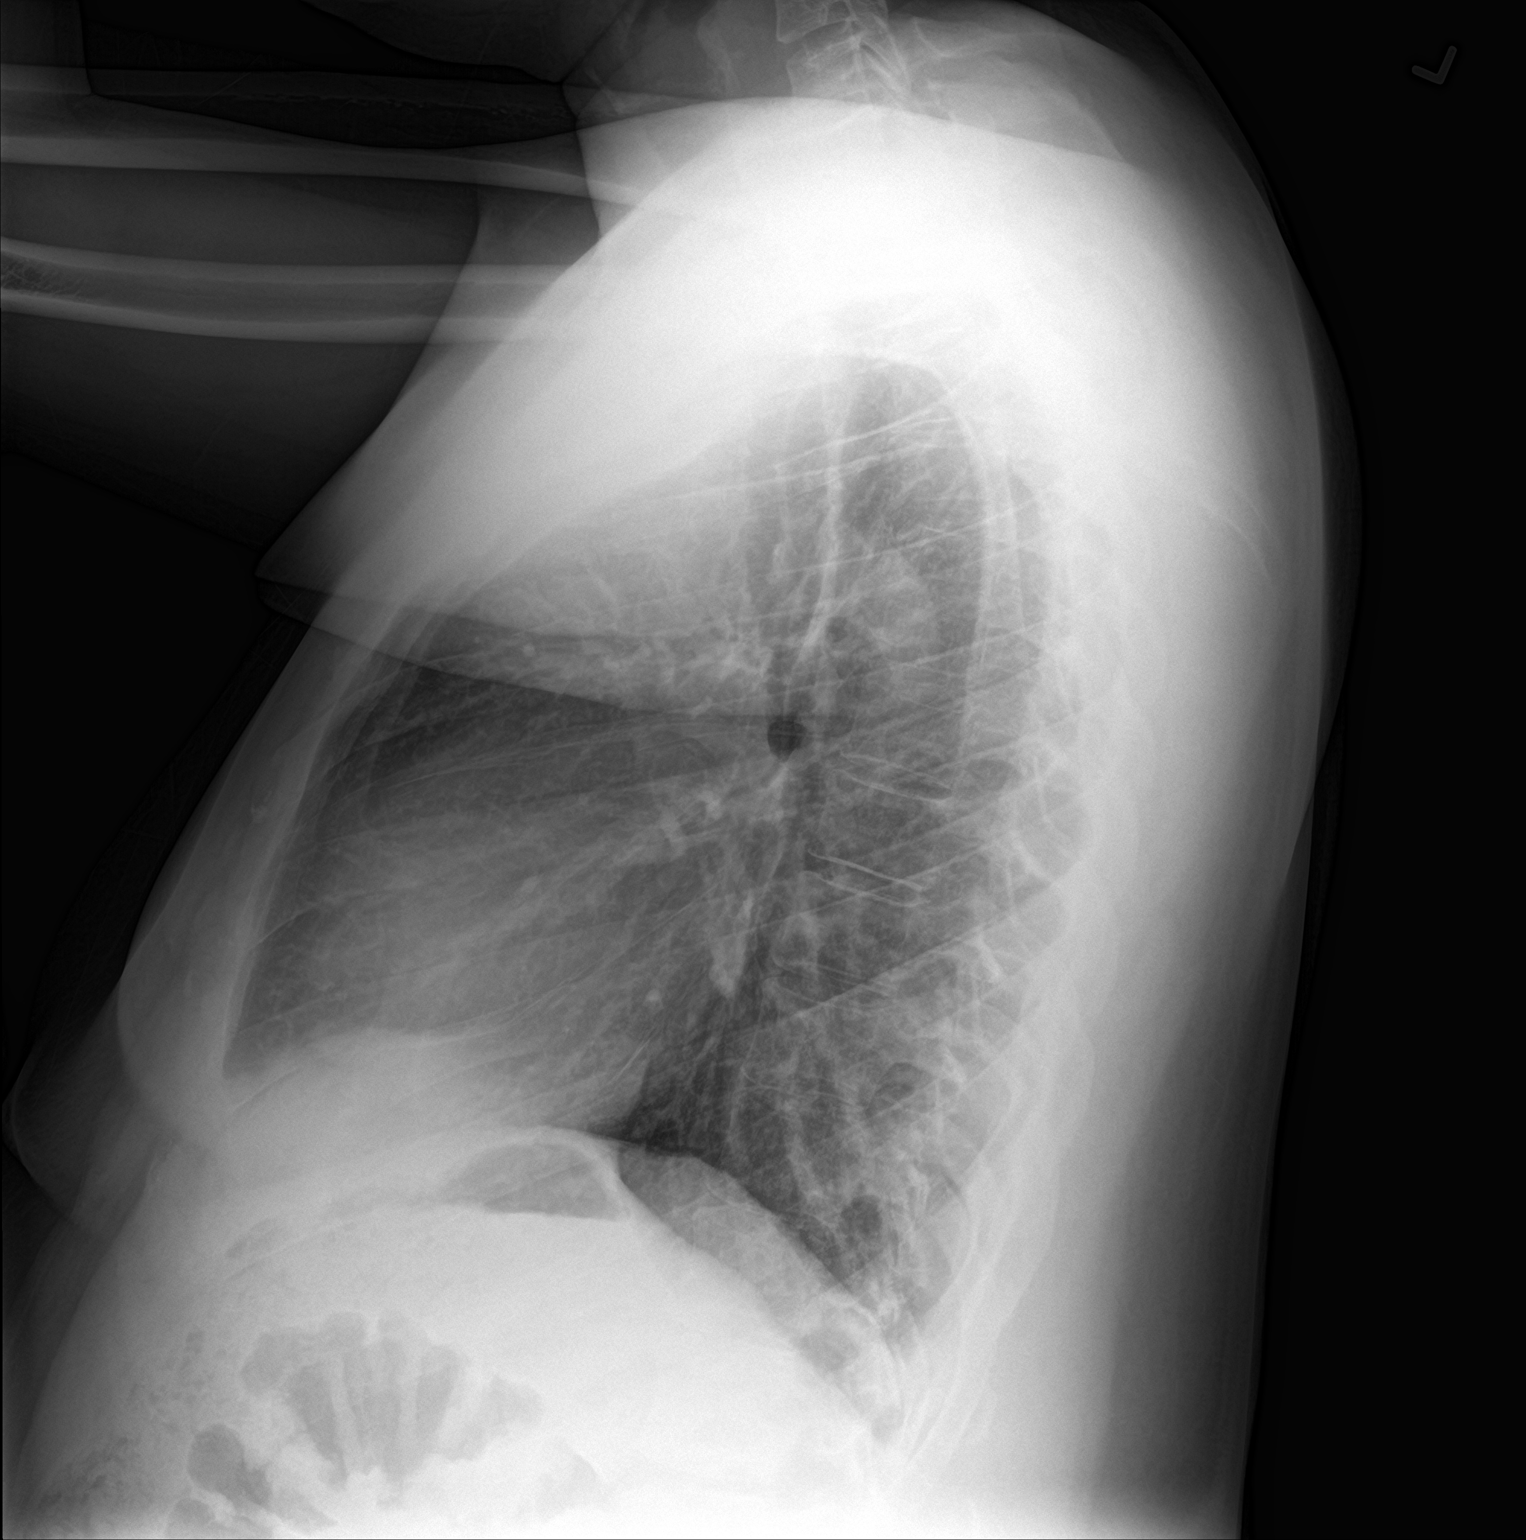

[2 of 2 positions shown; findings below may reference images not displayed]

FINDINGS: Stable lung volumes. Normal cardiac size and mediastinal contours.
Visualized tracheal air column is within normal limits. Stable mild
increased interstitial markings. No pneumothorax, pulmonary edema,
pleural effusion or acute pulmonary opacity. Dextro convex thoracic
scoliosis. No acute osseous abnormality identified.
IMPRESSION: No acute cardiopulmonary abnormality.

## 2017-08-14 ENCOUNTER — Encounter (HOSPITAL_COMMUNITY): Payer: Self-pay | Admitting: *Deleted

## 2017-08-14 ENCOUNTER — Other Ambulatory Visit: Payer: Self-pay

## 2017-08-14 ENCOUNTER — Emergency Department (HOSPITAL_COMMUNITY): Payer: Medicaid Other

## 2017-08-14 DIAGNOSIS — F259 Schizoaffective disorder, unspecified: Secondary | ICD-10-CM | POA: Insufficient documentation

## 2017-08-14 DIAGNOSIS — Z79899 Other long term (current) drug therapy: Secondary | ICD-10-CM | POA: Insufficient documentation

## 2017-08-14 DIAGNOSIS — F329 Major depressive disorder, single episode, unspecified: Secondary | ICD-10-CM | POA: Insufficient documentation

## 2017-08-14 DIAGNOSIS — F17210 Nicotine dependence, cigarettes, uncomplicated: Secondary | ICD-10-CM | POA: Insufficient documentation

## 2017-08-14 DIAGNOSIS — R45851 Suicidal ideations: Secondary | ICD-10-CM | POA: Insufficient documentation

## 2017-08-14 DIAGNOSIS — J45909 Unspecified asthma, uncomplicated: Secondary | ICD-10-CM | POA: Insufficient documentation

## 2017-08-14 LAB — COMPREHENSIVE METABOLIC PANEL
ALT: 25 U/L (ref 17–63)
ANION GAP: 7 (ref 5–15)
AST: 22 U/L (ref 15–41)
Albumin: 3.8 g/dL (ref 3.5–5.0)
Alkaline Phosphatase: 87 U/L (ref 38–126)
BUN: 10 mg/dL (ref 6–20)
CHLORIDE: 108 mmol/L (ref 101–111)
CO2: 26 mmol/L (ref 22–32)
Calcium: 8.9 mg/dL (ref 8.9–10.3)
Creatinine, Ser: 1.29 mg/dL — ABNORMAL HIGH (ref 0.61–1.24)
GFR calc non Af Amer: >60 mL/min (ref >60)
Glucose, Bld: 98 mg/dL (ref 65–99)
POTASSIUM: 4.1 mmol/L (ref 3.5–5.1)
SODIUM: 141 mmol/L (ref 135–145)
Total Bilirubin: 0.5 mg/dL (ref 0.3–1.2)
Total Protein: 6.7 g/dL (ref 6.5–8.1)

## 2017-08-14 LAB — SALICYLATE LEVEL

## 2017-08-14 LAB — CBC
HCT: 37.5 % — ABNORMAL LOW (ref 39.0–52.0)
Hemoglobin: 12.5 g/dL — ABNORMAL LOW (ref 13.0–17.0)
MCH: 27 pg (ref 26.0–34.0)
MCHC: 33.3 g/dL (ref 30.0–36.0)
MCV: 81 fL (ref 78.0–100.0)
Platelets: 292 10*3/uL (ref 150–400)
RBC: 4.63 MIL/uL (ref 4.22–5.81)
RDW: 15.7 % — AB (ref 11.5–15.5)
WBC: 7.2 10*3/uL (ref 4.0–10.5)

## 2017-08-14 LAB — RAPID URINE DRUG SCREEN, HOSP PERFORMED
Amphetamines: NOT DETECTED
BARBITURATES: NOT DETECTED
Benzodiazepines: NOT DETECTED
COCAINE: NOT DETECTED
OPIATES: NOT DETECTED
Tetrahydrocannabinol: POSITIVE — AB

## 2017-08-14 LAB — ACETAMINOPHEN LEVEL

## 2017-08-14 LAB — I-STAT TROPONIN, ED: TROPONIN I, POC: 0 ng/mL (ref 0.00–0.08)

## 2017-08-14 LAB — ETHANOL: Alcohol, Ethyl (B): <10 mg/dL (ref <10)

## 2017-08-14 NOTE — ED Triage Notes (Signed)
Pt c/o L sided abd pain and back pain that started for the past three days, has been having a cough for the past 3 weeks. Also reports SI with plan to slit wrists. Pt reports upcoming anniversary for a death.

## 2017-08-14 NOTE — ED Notes (Signed)
Pt placed in paper scrubs; belongings placed in locker #2; sandwich and sprite given

## 2017-08-15 ENCOUNTER — Emergency Department (HOSPITAL_COMMUNITY)
Admission: EM | Admit: 2017-08-15 | Discharge: 2017-08-15 | Disposition: A | Discharge status: Other Acute Inpatient Hospital | Payer: Medicaid Other | Attending: Emergency Medicine | Admitting: Emergency Medicine

## 2017-08-15 ENCOUNTER — Inpatient Hospital Stay (HOSPITAL_COMMUNITY)
Admission: AD | Admit: 2017-08-15 | Discharge: 2017-08-20 | DRG: 885 | Disposition: A | Discharge status: Home / Self Care | Payer: Medicaid Other | Source: Intra-hospital | Attending: Psychiatry | Admitting: Psychiatry

## 2017-08-15 ENCOUNTER — Encounter (HOSPITAL_COMMUNITY): Payer: Self-pay | Admitting: Behavioral Health

## 2017-08-15 ENCOUNTER — Encounter (HOSPITAL_COMMUNITY): Payer: Self-pay

## 2017-08-15 ENCOUNTER — Other Ambulatory Visit: Payer: Self-pay

## 2017-08-15 DIAGNOSIS — F1290 Cannabis use, unspecified, uncomplicated: Secondary | ICD-10-CM | POA: Diagnosis not present

## 2017-08-15 DIAGNOSIS — R45851 Suicidal ideations: Secondary | ICD-10-CM

## 2017-08-15 DIAGNOSIS — G4700 Insomnia, unspecified: Secondary | ICD-10-CM | POA: Diagnosis present

## 2017-08-15 DIAGNOSIS — Z79899 Other long term (current) drug therapy: Secondary | ICD-10-CM | POA: Diagnosis not present

## 2017-08-15 DIAGNOSIS — K30 Functional dyspepsia: Secondary | ICD-10-CM | POA: Diagnosis not present

## 2017-08-15 DIAGNOSIS — R059 Cough, unspecified: Secondary | ICD-10-CM

## 2017-08-15 DIAGNOSIS — Z91018 Allergy to other foods: Secondary | ICD-10-CM

## 2017-08-15 DIAGNOSIS — F259 Schizoaffective disorder, unspecified: Secondary | ICD-10-CM | POA: Diagnosis present

## 2017-08-15 DIAGNOSIS — F1390 Sedative, hypnotic, or anxiolytic use, unspecified, uncomplicated: Secondary | ICD-10-CM | POA: Diagnosis not present

## 2017-08-15 DIAGNOSIS — F419 Anxiety disorder, unspecified: Secondary | ICD-10-CM | POA: Diagnosis present

## 2017-08-15 DIAGNOSIS — Z634 Disappearance and death of family member: Secondary | ICD-10-CM | POA: Diagnosis not present

## 2017-08-15 DIAGNOSIS — Z915 Personal history of self-harm: Secondary | ICD-10-CM | POA: Diagnosis not present

## 2017-08-15 DIAGNOSIS — F17210 Nicotine dependence, cigarettes, uncomplicated: Secondary | ICD-10-CM | POA: Diagnosis present

## 2017-08-15 DIAGNOSIS — Z23 Encounter for immunization: Secondary | ICD-10-CM

## 2017-08-15 DIAGNOSIS — R450 Nervousness: Secondary | ICD-10-CM | POA: Diagnosis not present

## 2017-08-15 DIAGNOSIS — F250 Schizoaffective disorder, bipolar type: Principal | ICD-10-CM | POA: Diagnosis present

## 2017-08-15 DIAGNOSIS — R05 Cough: Secondary | ICD-10-CM

## 2017-08-15 DIAGNOSIS — Z811 Family history of alcohol abuse and dependence: Secondary | ICD-10-CM | POA: Diagnosis not present

## 2017-08-15 DIAGNOSIS — F1310 Sedative, hypnotic or anxiolytic abuse, uncomplicated: Secondary | ICD-10-CM | POA: Diagnosis not present

## 2017-08-15 DIAGNOSIS — F1210 Cannabis abuse, uncomplicated: Secondary | ICD-10-CM | POA: Diagnosis not present

## 2017-08-15 MED ORDER — GABAPENTIN 300 MG PO CAPS
300.0000 mg | ORAL_CAPSULE | Freq: Two times a day (BID) | ORAL | Status: DC
  Administered 2017-08-15 – 2017-08-16 (×3): 300 mg via ORAL
  Filled 2017-08-15 (×7): qty 1

## 2017-08-15 MED ORDER — ALBUTEROL SULFATE HFA 108 (90 BASE) MCG/ACT IN AERS: 1.0000 | INHALATION_SPRAY | Freq: Four times a day (QID) | RESPIRATORY_TRACT | Status: DC | PRN

## 2017-08-15 MED ORDER — HYDROXYZINE HCL 25 MG PO TABS
25.0000 mg | ORAL_TABLET | Freq: Three times a day (TID) | ORAL | Status: DC | PRN
  Administered 2017-08-17 – 2017-08-18 (×2): 25 mg via ORAL
  Filled 2017-08-15 (×3): qty 1

## 2017-08-15 MED ORDER — CITALOPRAM HYDROBROMIDE 10 MG PO TABS
10.0000 mg | ORAL_TABLET | Freq: Every day | ORAL | Status: DC
  Administered 2017-08-15 – 2017-08-16 (×2): 10 mg via ORAL
  Filled 2017-08-15 (×4): qty 1

## 2017-08-15 MED ORDER — TRAZODONE HCL 50 MG PO TABS
50.0000 mg | ORAL_TABLET | Freq: Every evening | ORAL | Status: DC | PRN
  Administered 2017-08-17 – 2017-08-18 (×2): 50 mg via ORAL
  Filled 2017-08-15 (×2): qty 1

## 2017-08-15 MED ORDER — PALIPERIDONE ER 6 MG PO TB24
6.0000 mg | ORAL_TABLET | Freq: Every day | ORAL | Status: DC
  Administered 2017-08-15 – 2017-08-16 (×2): 6 mg via ORAL
  Filled 2017-08-15 (×5): qty 1

## 2017-08-15 MED ORDER — INFLUENZA VAC SPLIT QUAD 0.5 ML IM SUSY
0.5000 mL | PREFILLED_SYRINGE | INTRAMUSCULAR | Status: AC
  Administered 2017-08-16: 0.5 mL via INTRAMUSCULAR
  Filled 2017-08-15: qty 0.5
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
## 2017-08-15 NOTE — Progress Notes (Addendum)
Nursing Progress Note: 7p-7a D: Pt currently presents with a sad/flat/depressed/anxious affect and behavior. Pt states "My day was just kinda up and down. I don't have any idea of how long I've been here. I really want to talk to a doctor. I feel so down all the time" Interacting appropriately with the milieu. Pt reports good sleep during the previous night with current medication regimen. Pt did attend wrap-up group.  A: Pt declined PRN's. Pt's labs and vitals were monitored throughout the night. Pt supported emotionally and encouraged to express concerns and questions. Pt educated on medications.  R: Pt's safety ensured with 15 minute and environmental checks. Pt currently denies HI and AVH and endorses passive SI. Pt verbally contracts to seek staff if SI,HI, or AVH occurs and to consult with staff before acting on any harmful thoughts. Will continue to monitor.

## 2017-08-15 NOTE — ED Provider Notes (Signed)
[facility] EMERGENCY DEPARTMENT Provider Note   CSN: [id number] Arrival date & time: 08/14/17  1953     History   Chief Complaint Chief Complaint  Patient presents with  . Chest Pain  . Suicidal    HPI Ronald Martinez is a 37 y.o. male.  Patient presents to the emergency department with chief complaint of suicidal ideation.  He states that he has been feeling very depressed because he is approaching the anniversary of family members death.  He states that he has been feeling depressed and would like to slit his wrists and die.  He denies ingesting anything.  He denies cutting himself.  He denies alcohol or drug use.  Additionally, he states that he has had a cough, and reports having left-sided chest pain when coughing.  He denies any fever.  Denies any shortness of breath.  Denies any radiating pain.  Denies any history of ACS.   The history is provided by the patient. No language interpreter was used.    Past Medical History:  Diagnosis Date  . Asthma   . Bipolar 1 disorder (HCC)   . Depression   . Schizophrenia [facility])     Patient Active Problem List   Diagnosis Date Noted  . 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 Cannabis use disorder, severe, dependence (HCC) 08/17/2014  . Schizoaffective disorder, bipolar type (HCC) 08/16/2014    History reviewed. No pertinent surgical history.     Home Medications    Prior to Admission medications   Medication Sig Start Date End Date Taking? Authorizing Provider  albuterol (PROVENTIL HFA;VENTOLIN HFA) 108 (90 Base) MCG/ACT inhaler Inhale 1-2 puffs into the lungs every 6 (six) hours as needed for wheezing or shortness of breath. 06/11/17   Maczis, Elmer Sow, PA-C  benztropine (COGENTIN) 0.5 MG tablet Take 1 tablet (0.5 mg total) by mouth 2 (two) times daily. 12/13/16   Denzil Magnuson, NP  citalopram (CELEXA) 10 MG tablet Take 1 tablet (10 mg total) by mouth daily. 12/14/16   Denzil Magnuson, NP    doxycycline (VIBRAMYCIN) 100 MG capsule Take 1 capsule (100 mg total) by mouth 2 (two) times daily. 06/11/17   Maczis, Elmer Sow, PA-C  gabapentin (NEURONTIN) 300 MG capsule Take 1 capsule (300 mg total) by mouth 2 (two) times daily. 12/13/16   Denzil Magnuson, NP  hydrOXYzine (ATARAX/VISTARIL) 25 MG tablet Take 1 tablet (25 mg total) by mouth 3 (three) times daily as needed for anxiety. 12/13/16   Denzil Magnuson, NP  paliperidone (INVEGA) 6 MG 24 hr tablet Take 1 tablet (6 mg total) by mouth daily. 12/14/16   Denzil Magnuson, NP  traZODone (DESYREL) 50 MG tablet Take 1 tablet (50 mg total) by mouth at bedtime as needed for sleep. 12/13/16   Denzil Magnuson, NP    Family History Family History  Problem Relation Age of Onset  . Asthma Mother   . Alcoholism Other     Social History Social History   Tobacco Use  . Smoking status: Current Some Day Smoker    Packs/day: 1.00    Types: Cigarettes  . Smokeless tobacco: Never Used  Substance Use Topics  . Alcohol use: Yes  . Drug use: Yes    Types: Marijuana, Benzodiazepines     Allergies   Salami [pickled meat]   Review of Systems Review of Systems  All other systems reviewed and are negative.    Physical Exam Updated Vital Signs BP 115/67   Pulse 98  Temp 98.5 F (36.9 C) (Oral)   Resp 16   SpO2 99%   Physical Exam  Constitutional: He is oriented to person, place, and time. He appears well-developed and well-nourished.  HENT:  Head: Normocephalic and atraumatic.  Eyes: Conjunctivae and EOM are normal. Pupils are equal, round, and reactive to light. Right eye exhibits no discharge. Left eye exhibits no discharge. No scleral icterus.  Neck: Normal range of motion. Neck supple. No JVD present.  Cardiovascular: Normal rate, regular rhythm and normal heart sounds. Exam reveals no gallop and no friction rub.  No murmur heard. Left-sided chest wall tender to palpation  Pulmonary/Chest: Effort normal and breath sounds  normal. No respiratory distress. He has no wheezes. He has no rales. He exhibits no tenderness.  CTAB  Abdominal: Soft. He exhibits no distension and no mass. There is no tenderness. There is no rebound and no guarding.  Musculoskeletal: Normal range of motion. He exhibits no edema or tenderness.  Neurological: He is alert and oriented to person, place, and time.  Skin: Skin is warm and dry.  Psychiatric: He has a normal mood and affect. His behavior is normal. Judgment and thought content normal.  Nursing note and vitals reviewed.    ED Treatments / Results  Labs (all labs ordered are listed, but only abnormal results are displayed) Labs Reviewed  CBC - Abnormal; Notable for the following components:      Result Value   Hemoglobin 12.5 (*)    HCT 37.5 (*)    RDW 15.7 (*)    All other components within normal limits  COMPREHENSIVE METABOLIC PANEL - Abnormal; Notable for the following components:   Creatinine, Ser 1.29 (*)    All other components within normal limits  ACETAMINOPHEN LEVEL - Abnormal; Notable for the following components:   Acetaminophen (Tylenol), Serum <10 (*)    All other components within normal limits  RAPID URINE DRUG SCREEN, HOSP PERFORMED - Abnormal; Notable for the following components:   Tetrahydrocannabinol POSITIVE (*)    All other components within normal limits  ETHANOL  SALICYLATE LEVEL  I-STAT TROPONIN, ED    EKG  EKG Interpretation None     ED ECG REPORT  I personally interpreted this EKG   Date: 08/15/2017   Rate: 96  Rhythm: normal sinus rhythm  QRS Axis: normal  Intervals: normal  ST/T Wave abnormalities: normal  Conduction Disutrbances:none  Narrative Interpretation:   Old EKG Reviewed: none available    Radiology Dg Chest 2 View  Result Date: 08/14/2017 CLINICAL DATA:  Acute onset of left-sided chest pain. Subacute onset of cough. EXAM: CHEST - 2 VIEW COMPARISON:  Chest radiograph performed 06/11/2017 FINDINGS: The lungs are  well-aerated and clear. There is no evidence of focal opacification, pleural effusion or pneumothorax. The heart is normal in size; the mediastinal contour is within normal limits. No acute osseous abnormalities are seen. IMPRESSION: No acute cardiopulmonary process seen. No displaced rib fractures identified. Electronically Signed   By: Roanna RaiderJeffery  Chang M.D.   On: 08/14/2017 21:21    Procedures Procedures (including critical care time)  Medications Ordered in ED Medications - No data to display   Initial Impression / Assessment and Plan / ED Course  I have reviewed the triage vital signs and the nursing notes.  Pertinent labs & imaging results that were available during my care of the patient were reviewed by me and considered in my medical decision making (see chart for details).    Patient with depression and  suicidal ideation.  He states that he wants to slit his wrists.  Denies any drug or alcohol use.  Patient with left-sided chest wall pain.  Tender to palpation.  Likely intercostal straining/musculoskeletal.  Doubt ACS.  Troponin is negative.  No ischemic changes on EKG.  Medically clear for TTS evaluation.  Inpatient treatment is recommended for the patient.  Final Clinical Impressions(s) / ED Diagnoses   Final diagnoses:  Suicidal ideation  Cough    ED Discharge Orders    None       Roxy Horseman, PA-C 08/15/17 [id number]    Glynn Octave, MD 08/15/17 [phone]
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
## 2017-08-15 NOTE — ED Notes (Signed)
Transport called.

## 2017-08-15 NOTE — Tx Team (Signed)
Initial Treatment Plan 08/15/2017 2:18 PM Warnell ForesterKevin Kitt WUJ:[id number]    PATIENT STRESSORS: Financial difficulties Medication change or noncompliance Substance abuse   PATIENT STRENGTHS: Capable of independent living Communication skills Supportive family/friends   PATIENT IDENTIFIED PROBLEMS: "getting well"  "more coping skills"  Depression  Suicidal Ideation               DISCHARGE CRITERIA:  Ability to meet basic life and health needs Adequate post-discharge living arrangements Motivation to continue treatment in a less acute level of care  PRELIMINARY DISCHARGE PLAN: Attend aftercare/continuing care group Outpatient therapy Return to previous living arrangement  PATIENT/FAMILY INVOLVEMENT: This treatment plan has been presented to and reviewed with the patient, Warnell ForesterKevin Motton, and/or family member.  The patient and family have been given the opportunity to ask questions and make suggestions.  Clarene CritchleyElizabeth O Maui Britten, RN 08/15/2017, 2:18 PM

## 2017-08-15 NOTE — Progress Notes (Signed)
Adult Psychoeducational Group Note  Date:  08/15/2017 Time:  9:01 PM  Group Topic/Focus:  Wrap-Up Group:   The focus of this group is to help patients review their daily goal of treatment and discuss progress on daily workbooks.  Participation Level:  Minimal  Participation Quality:  Appropriate  Affect:  Flat  Cognitive:  Oriented  Insight: Improving  Engagement in Group:  Engaged  Modes of Intervention:  Socialization and Support  Additional Comments:  Patient attended and participated in group tonight. He reports having a up and down day. [facility]. He did went to a group and went for meals.  Lita MainsFrancis, Severina Sykora [facility] 08/15/2017, 9:01 PM

## 2017-08-15 NOTE — ED Notes (Signed)
Transport  [facility] [facility] given belongings including valuables.

## 2017-08-15 NOTE — Progress Notes (Signed)
[facility] Shuvon Rankin, NP is the accepting provider.   Dr. [name] is the attending provider.   Call report to [phone]  Jesse SansJanee, RN @[facility]  ED notified.  Pt is Voluntary.   Pt may be transported by [facility]. [facility]  Wells GuilesSarah Kalin Amrhein, LCSW, LCAS Disposition CSW [facility]/TTS [phone]

## 2017-08-15 NOTE — Progress Notes (Signed)
Admission Note: Patient is a 37 year old male admitted to the unit for symptoms of depression and suicidal thoughts but verbally contracts for safety.  Patient presents with flat affect and depressed mood.  Patient is alert and oriented x 4.  Admission plan of care reviewed and consent signed.  Skin assessment completed.  Skin is dry and intact.  Personal belonging searched.  No contraband found.  Patient oriented to the unit, staff and room.  Safety checks initiated.  Patient is visible in the dayroom watching TV with minimal interaction with staff and peers.

## 2017-08-15 NOTE — ED Notes (Signed)
[facility] [place] [facility]

## 2017-08-15 NOTE — ED Notes (Signed)
Patient breakfast at bedside,  

## 2017-08-15 NOTE — ED Notes (Signed)
Patient sitter at bedside,

## 2017-08-15 NOTE — B[facility] Group Notes (Signed)
LCSW Group Therapy Note  08/15/2017 1:15pm  Type of Therapy and Topic:  Group Therapy:  Feelings around Relapse and Recovery  Participation Level:  Active   Description of Group:    Patients in this group will discuss emotions they experience before and after a relapse. They will process how experiencing these feelings, or avoidance of experiencing them, relates to having a relapse. Facilitator will guide patients to explore emotions they have related to recovery. Patients will be encouraged to process which emotions are more powerful. They will be guided to discuss the emotional reaction significant others in their lives may have to their relapse or recovery. Patients will be assisted in exploring ways to respond to the emotions of others without this contributing to a relapse.  Therapeutic Goals: 1. Patient will identify two or more emotions that lead to a relapse for them 2. Patient will identify two emotions that result when they relapse 3. Patient will identify two emotions related to recovery 4. Patient will demonstrate ability to communicate their needs through discussion and/or role plays   Summary of Patient Progress:   Ronald Martinez came to group and remained there the entire time.  His definition for relapse is going back to old behaviors and old ways.  He shared that when he leaves the hospital he will try to stay on track with his medication by remembering to take his medication both in the morning and at night.  He wants to be more consistent with what time he takes his medication each day but was not able to share how he would keep track of it.   Therapeutic Modalities:   Cognitive Behavioral Therapy Solution-Focused Therapy Assertiveness Training Relapse Prevention Therapy   Aram Beechamngel M Lyndi Holbein, Student-Social Work 08/15/2017 1:30 PM

## 2017-08-16 DIAGNOSIS — R450 Nervousness: Secondary | ICD-10-CM

## 2017-08-16 DIAGNOSIS — Z811 Family history of alcohol abuse and dependence: Secondary | ICD-10-CM

## 2017-08-16 DIAGNOSIS — F419 Anxiety disorder, unspecified: Secondary | ICD-10-CM

## 2017-08-16 DIAGNOSIS — Z634 Disappearance and death of family member: Secondary | ICD-10-CM

## 2017-08-16 DIAGNOSIS — F250 Schizoaffective disorder, bipolar type: Principal | ICD-10-CM

## 2017-08-16 DIAGNOSIS — G4700 Insomnia, unspecified: Secondary | ICD-10-CM

## 2017-08-16 DIAGNOSIS — R45851 Suicidal ideations: Secondary | ICD-10-CM

## 2017-08-16 DIAGNOSIS — F17210 Nicotine dependence, cigarettes, uncomplicated: Secondary | ICD-10-CM

## 2017-08-16 MED ORDER — BUPROPION HCL ER (XL) 300 MG PO TB24
450.0000 mg | ORAL_TABLET | Freq: Every day | ORAL | Status: DC
  Administered 2017-08-17 – 2017-08-20 (×4): 450 mg via ORAL
  Filled 2017-08-16 (×7): qty 1

## 2017-08-16 MED ORDER — GABAPENTIN 300 MG PO CAPS
300.0000 mg | ORAL_CAPSULE | Freq: Three times a day (TID) | ORAL | Status: DC
  Administered 2017-08-17 – 2017-08-20 (×11): 300 mg via ORAL
  Filled 2017-08-16 (×16): qty 1

## 2017-08-16 NOTE — H&P (Signed)
Psychiatric Admission Assessment Adult  Patient Identification: Ronald Martinez MRN:  [id number] Date of Evaluation:  08/16/2017 Chief Complaint:  SCHIZOAFFECTIVE DISORDER Principal Diagnosis: Schizoaffective disorder, bipolar type ([place]) Diagnosis:   Patient Active Problem List   Diagnosis Date Noted  . Schizoaffective disorder, bipolar type ([place]) [F25.0] 08/16/2014    Priority: High  . Benzodiazepine abuse in remission ([place]) [F13.11] 01/09/2016  . Cannabis use disorder, severe, dependence ([place]) [F12.20] 08/17/2014   History of Present Illness: Patient admitted to [facility] for depression and suicidal ideations with the intent to cut his wrists.  Anniversary of his best friend's death who died 10 years ago.  Two prior suicide attempts by overdose.  Today, his depression is a 5/10 with anxiety.  He does have an ACT team, [facility], who manages his medications and therapy.  Denies hallucinations and homicidal ideations along with substance abuse besides a joint a couple of times a week.  Sleep is "ok", appetite is "fair".  Lives alone in his own apartment.  Anxious and pacing on the unit at times but no aggression.  Associated Signs/Symptoms: Depression Symptoms:  depressed mood, difficulty concentrating, hopelessness, suicidal thoughts with specific plan, (Hypo) Manic Symptoms:  none Anxiety Symptoms:  Excessive Worry, Psychotic Symptoms:  none PTSD Symptoms: Negative Total Time spent with patient: 45 minutes  Past Psychiatric History: schizoaffective disorder, substance abuse  Is the patient at risk to self? Yes.    Has the patient been a risk to self in the past 6 months? Yes.    Has the patient been a risk to self within the distant past? Yes.    Is the patient a risk to others? No.  Has the patient been a risk to others in the past 6 months? No.  Has the patient been a risk to others within the distant past? No.   Prior Inpatient Therapy:  [facility] multiple times Prior Outpatient Therapy:   [facility]  Alcohol Screening: Patient refused Alcohol Screening Tool: Yes 1. How often do you have a drink containing alcohol?: Never 2. How many drinks containing alcohol do you have on a typical day when you are drinking?: 1 or 2 3. How often do you have six or more drinks on one occasion?: Never AUDIT-C Score: 0 4. How often during the last year have you found that you were not able to stop drinking once you had started?: Never 5. How often during the last year have you failed to do what was normally expected from you becasue of drinking?: Never 6. How often during the last year have you needed a first drink in the morning to get yourself going after a heavy drinking session?: Never 7. How often during the last year have you had a feeling of guilt of remorse after drinking?: Never 8. How often during the last year have you been unable to remember what happened the night before because you had been drinking?: Never 9. Have you or someone else been injured as a result of your drinking?: No 10. Has a relative or friend or a doctor or another health worker been concerned about your drinking or suggested you cut down?: No Alcohol Use Disorder Identification Test Final Score (AUDIT): 0 Intervention/Follow-up: Patient Refused Substance Abuse History in the last 12 months:  Yes.   Consequences of Substance Abuse: Negative Previous Psychotropic Medications: Yes  Psychological Evaluations: Yes  Past Medical History:  Past Medical History:  Diagnosis Date  . Asthma   . Bipolar 1 disorder ([place])   . Depression   .  Schizophrenia ([place])    History reviewed. No pertinent surgical history. Family History:  Family History  Problem Relation Age of Onset  . Asthma Mother   . Alcoholism Other    Family Psychiatric  History: alcohol dependence Tobacco Screening: Have you used any form of tobacco in the last 30 days? (Cigarettes, Smokeless Tobacco, Cigars, and/or Pipes): Yes Tobacco use, Select all that  apply: 4 or less cigarettes per day Are you interested in Tobacco Cessation Medications?: No, patient refused Social History:  Social History   Substance and Sexual Activity  Alcohol Use Yes   Comment: only drinks on occasion     Social History   Substance and Sexual Activity  Drug Use Yes  . Types: Marijuana, Benzodiazepines    Additional Social History:                           Allergies:   Allergies  Allergen Reactions  . [place] Brownie Meat] Nausea And Vomiting    Allergic to bologna and salami   Lab Results:  Results for orders placed or performed during the hospital encounter of 08/15/17 (from the past 48 hour(s))  CBC     Status: Abnormal   Collection Time: 08/14/17  9:17 PM  Result Value Ref Range   WBC 7.2 4.0 - 10.5 K/uL   RBC 4.63 4.22 - 5.81 MIL/uL   Hemoglobin 12.5 (L) 13.0 - 17.0 g/dL   HCT 37.5 (L) 39.0 - 52.0 %   MCV 81.0 78.0 - 100.0 fL   MCH 27.0 26.0 - 34.0 pg   MCHC 33.3 30.0 - 36.0 g/dL   RDW 15.7 (H) 11.5 - 15.5 %   Platelets 292 150 - 400 K/uL    Comment: Performed at [facility], [place] [address], [place], [place] [address]  Comprehensive metabolic panel     Status: Abnormal   Collection Time: 08/14/17  9:17 PM  Result Value Ref Range   Sodium 141 135 - 145 mmol/L   Potassium 4.1 3.5 - 5.1 mmol/L   Chloride 108 101 - 111 mmol/L   CO2 26 22 - 32 mmol/L   Glucose, Bld 98 65 - 99 mg/dL   BUN 10 6 - 20 mg/dL   Creatinine, Ser 1.29 (H) 0.61 - 1.24 mg/dL   Calcium 8.9 8.9 - 10.3 mg/dL   Total Protein 6.7 6.5 - 8.1 g/dL   Albumin 3.8 3.5 - 5.0 g/dL   AST 22 15 - 41 U/L   ALT 25 17 - 63 U/L   Alkaline Phosphatase 87 38 - 126 U/L   Total Bilirubin 0.5 0.3 - 1.2 mg/dL   GFR calc non Af Amer >60 >60 mL/min   GFR calc Af Amer >60 >60 mL/min    Comment: (NOTE) The eGFR has been calculated using the CKD EPI equation. This calculation has not been validated in all clinical situations. eGFR's persistently <60 mL/min signify  possible Chronic Kidney Disease.    Anion gap 7 5 - 15    Comment: Performed at [facility] [address] [place], [place] [address]  Ethanol     Status: None   Collection Time: 08/14/17  9:18 PM  Result Value Ref Range   Alcohol, Ethyl (B) <10 <10 mg/dL    Comment:        LOWEST DETECTABLE LIMIT FOR SERUM ALCOHOL IS 10 mg/dL FOR MEDICAL PURPOSES ONLY Performed at [facility], [place]  [address] [place], [place] [address]   Salicylate level     Status: None   Collection Time: 08/14/17  9:18 PM  Result Value Ref Range   Salicylate Lvl <8.2 2.8 - 30.0 mg/dL    Comment: Performed at [facility] [address], [place], [place] [address]  Acetaminophen level     Status: Abnormal   Collection Time: 08/14/17  9:18 PM  Result Value Ref Range   Acetaminophen (Tylenol), Serum <10 (L) 10 - 30 ug/mL    Comment:        THERAPEUTIC CONCENTRATIONS VARY SIGNIFICANTLY. A RANGE OF 10-30 ug/mL MAY BE AN EFFECTIVE CONCENTRATION FOR MANY PATIENTS. HOWEVER, SOME ARE BEST TREATED AT CONCENTRATIONS OUTSIDE THIS RANGE. ACETAMINOPHEN CONCENTRATIONS >150 ug/mL AT 4 HOURS AFTER INGESTION AND >50 ug/mL AT 12 HOURS AFTER INGESTION ARE OFTEN ASSOCIATED WITH TOXIC REACTIONS. Performed at [facility], [place] [address] [place], [place] [address]   I-stat troponin, ED     Status: None   Collection Time: 08/14/17  9:27 PM  Result Value Ref Range   Troponin i, poc 0.00 0.00 - 0.08 ng/mL   Comment 3            Comment: Due to the release kinetics of cTnI, a negative result within the first hours of the onset of symptoms does not rule out myocardial infarction with certainty. If myocardial infarction is still suspected, repeat the test at appropriate intervals.   Rapid urine drug screen (hospital performed)     Status: Abnormal   Collection Time: 08/14/17 10:14 PM  Result Value Ref Range   Opiates NONE DETECTED NONE DETECTED   Cocaine NONE DETECTED NONE DETECTED    Benzodiazepines NONE DETECTED NONE DETECTED   Amphetamines NONE DETECTED NONE DETECTED   Tetrahydrocannabinol POSITIVE (A) NONE DETECTED   Barbiturates NONE DETECTED NONE DETECTED    Comment: (NOTE) DRUG SCREEN FOR MEDICAL PURPOSES ONLY.  IF CONFIRMATION IS NEEDED FOR ANY PURPOSE, NOTIFY LAB WITHIN 5 DAYS. LOWEST DETECTABLE LIMITS FOR URINE DRUG SCREEN Drug Class                     Cutoff (ng/mL) Amphetamine and metabolites    1000 Barbiturate and metabolites    200 Benzodiazepine                 505 Tricyclics and metabolites     300 Opiates and metabolites        300 Cocaine and metabolites        300 THC                            50 Performed at [facility], [place] [address] [place], [place] [address]     Blood Alcohol level:  Lab Results  Component Value Date   [facility] <10 08/14/2017   ETH <5 80/16/5537    Metabolic Disorder Labs:  Lab Results  Component Value Date   HGBA1C 5.7 (H) 12/11/2016   MPG 117 12/11/2016   MPG 114 01/10/2016   Lab Results  Component Value Date   PROLACTIN 23.9 (H) 12/11/2016   PROLACTIN 15.7 (H) 01/09/2016   Lab Results  Component Value Date   CHOL 184 12/11/2016   TRIG 65 12/11/2016   HDL 59 12/11/2016   CHOLHDL 3.1 12/11/2016   VLDL 13 12/11/2016   LDLCALC 112 (H) 12/11/2016   LDLCALC 102 (H) 01/10/2016    Current Medications:  Current Facility-Administered Medications  Medication Dose Route Frequency Provider Last Rate Last Dose  . albuterol (PROVENTIL HFA;VENTOLIN HFA) 108 (90 Base) MCG/ACT inhaler 1-2 puff  1-2 puff Inhalation Q6H PRN Rankin, Shuvon B, NP      . citalopram (CELEXA) tablet 10 mg  10 mg Oral Daily Rankin, Shuvon B, NP   10 mg at 08/16/17 1051  . gabapentin (NEURONTIN) capsule 300 mg  300 mg Oral BID Rankin, Shuvon B, NP   300 mg at 08/16/17 1051  . hydrOXYzine (ATARAX/VISTARIL) tablet 25 mg  25 mg Oral TID PRN Rankin, Shuvon B, NP      . paliperidone (INVEGA) 24 hr tablet 6 mg  6 mg Oral Daily Rankin,  Shuvon B, NP   6 mg at 08/16/17 1051  . traZODone (DESYREL) tablet 50 mg  50 mg Oral QHS PRN Rankin, Shuvon B, NP       PTA Medications: Medications Prior to Admission  Medication Sig Dispense Refill Last Dose  . albuterol (PROVENTIL HFA;VENTOLIN HFA) 108 (90 Base) MCG/ACT inhaler Inhale 1-2 puffs into the lungs every 6 (six) hours as needed for wheezing or shortness of breath. 1 Inhaler 0 Past Week at Unknown time  . benztropine (COGENTIN) 0.5 MG tablet Take 1 tablet (0.5 mg total) by mouth 2 (two) times daily. 60 tablet 0 Past Week at Unknown time  . gabapentin (NEURONTIN) 300 MG capsule Take 1 capsule (300 mg total) by mouth 2 (two) times daily. 60 capsule 0 Past Week at Unknown time  . hydrOXYzine (ATARAX/VISTARIL) 25 MG tablet Take 1 tablet (25 mg total) by mouth 3 (three) times daily as needed for anxiety. 30 tablet 0 Past Week at Unknown time  . Paliperidone Palmitate (INVEGA TRINZA IM) Inject into the muscle every 3 (three) months.   06/2017  . traZODone (DESYREL) 50 MG tablet Take 1 tablet (50 mg total) by mouth at bedtime as needed for sleep. 30 tablet 0 Past Week at Unknown time  . citalopram (CELEXA) 10 MG tablet Take 1 tablet (10 mg total) by mouth daily. (Patient not taking: Reported on 08/15/2017) 30 tablet 0 Not Taking at Unknown time  . paliperidone (INVEGA) 6 MG 24 hr tablet Take 1 tablet (6 mg total) by mouth daily. (Patient not taking: Reported on 08/15/2017) 30 tablet 0 Not Taking at Unknown time    Musculoskeletal: Strength & Muscle Tone: within normal limits Gait & Station: normal Patient leans: N/A  Psychiatric Specialty Exam: Physical Exam  Constitutional: He is oriented to person, place, and time. He appears well-developed and well-nourished.  HENT:  Head: Normocephalic.  Neck: Normal range of motion.  Respiratory: Effort normal.  Musculoskeletal: Normal range of motion.  Neurological: He is alert and oriented to person, place, and time.  Psychiatric: His speech  is normal and behavior is normal. His mood appears anxious. Cognition and memory are normal. He expresses impulsivity. He exhibits a depressed mood. He expresses suicidal ideation. He expresses suicidal plans.    Review of Systems  Psychiatric/Behavioral: Positive for depression and suicidal ideas. The patient is nervous/anxious.   All other systems reviewed and are negative.   Blood pressure 138/75, pulse 92, temperature 97.8 F (36.6 C), temperature source Oral, resp. rate 20, height 5' 11"  (1.803 m), weight 109.3 kg (241 lb), SpO2 100 %.Body mass index is 33.61 kg/m.  General Appearance: Casual  Eye Contact:  Fair  Speech:  Normal Rate  Volume:  Increased  Mood:  Anxious and Depressed  Affect:  Congruent  Thought Process:  Coherent and Descriptions of Associations: Intact  Orientation:  Full (Time, Place, and Person)  Thought Content:  Rumination  Suicidal Thoughts:  Yes.  with intent/plan  Homicidal Thoughts:  No  Memory:  Immediate;   Fair Recent;   Fair Remote;   Fair  Judgement:  Impaired  Insight:  Fair  Psychomotor Activity:  Increased  Concentration:  Concentration: Fair and Attention Span: Fair  Recall:  [employer] of Knowledge:  Fair  Language:  Good  Akathisia:  No  Handed:  Right  AIMS (if indicated):     Assets:  Housing Leisure Time Physical Health Resilience Social Support  ADL's:  Intact  Cognition:  WNL  Sleep:  Number of Hours: 6.75    Treatment Plan Summary: Daily contact with patient to assess and evaluate symptoms and progress in treatment, Medication management and Plan schizoaffective disorder, bipolar type:  Observation Level/Precautions:  15 minute checks  Laboratory:  Completed in ED, reviewed, stable  Psychotherapy:  Individual and group therapy  Medications:  See MAR  Consultations:  None  Discharge Concerns:  None  Estimated LOS:  5-7 days  Other:     Physician Treatment Plan for Primary Diagnosis: Schizoaffective disorder, bipolar  type ([place])   Treatment Plan Summary: Daily contact with patient to assess and evaluate symptoms and progress in treatment and Medication management  -Continue inpatient hospitalization  - Schizoaffective disorder            - Started Invega 6 mg daily for mood stabilization  -Depression              - Started Celexa 10 mg daily for depression  -Anxiety             -Continued gabapentin 351m po BID             -Continued hydroxyzine 25 mg po q6h prn anxiety  -Insomnia             - Continued trazodone 563mpo qhs prn insomnia  - Encourage participation in groups and therapeutic milieu  -Disposition planning will be ongoing  Long Term Goal(s): Improvement in symptoms so as ready for discharge  Short Term Goals: Ability to identify changes in lifestyle to reduce recurrence of condition will improve, Ability to verbalize feelings will improve, Ability to disclose and discuss suicidal ideas, Ability to demonstrate self-control will improve, Ability to identify and develop effective coping behaviors will improve, Ability to maintain clinical measurements within normal limits will improve, Compliance with prescribed medications will improve and Ability to identify triggers associated with substance abuse/mental health issues will improve  Physician Treatment Plan for Secondary Diagnosis: Principal Problem:   Schizoaffective disorder, bipolar type ([place] Long Term Goal(s): Improvement in symptoms so as ready for discharge  Short Term Goals: Ability to identify changes in lifestyle to reduce recurrence of condition will improve, Ability to verbalize feelings will improve, Ability to disclose and discuss suicidal ideas, Ability to demonstrate self-control will improve, Ability to identify and develop effective coping behaviors will improve, Ability to maintain clinical measurements within normal limits will improve, Compliance with prescribed medications will improve and Ability to  identify triggers associated with substance abuse/mental health issues will improve  I certify that inpatient services furnished can reasonably be expected to improve the patient's condition.    LOWaylan BogaNP 3/16/20193:51 PM

## 2017-08-16 NOTE — Progress Notes (Signed)
DAR NOTE: Patient presents with flat affect and depressed mood.  Denies pain, auditory and visual hallucinations.  Described energy level as normal and concentration.  Rates depression at 5, hopelessness at 6, and anxiety at 5.  Maintained on routine safety checks.  Medications given as prescribed.  Support and encouragement offered as needed.  Attended group and participated.  States goal for today is "focus on treatment."  Patient observed socializing with peers in the dayroom.  Offered no complaint.

## 2017-08-16 NOTE — BHH Suicide Risk Assessment (Signed)
[facility] Admission Suicide Risk Assessment   Nursing information obtained from:  Patient Demographic factors:  Male Current Mental Status:  NA Loss Factors:  Financial problems / change in socioeconomic status Historical Factors:  Prior suicide attempts Risk Reduction Factors:  Positive therapeutic relationship  Total Time spent with patient: 45 minutes Principal Problem: Schizoaffective disorder, bipolar type (H[facility]) Diagnosis:   Patient Active Problem List   Diagnosis Date Noted  . 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 Cannabis use disorder, severe, dependence ([facility]) [F12.20] 08/17/2014  . Schizoaffective disorder, bipolar type ([facility]) [F25.0] 08/16/2014   Subjective Data:  37 y.o AAM, single, lives alone. Background history of Schizoaffective disorder. Presented to the ER voluntarily. Reports increasing depression associated with suicidal thoughts. Has thoughts to cut self. Main stressor is upcoming anniversary of his best friend. Says he has not been coping well. Patient is followed by [facility]. He is on [place]. Had his shot in January. No hallucination in any modality. No persecutory delusion. No grandiose delusion. No other form of delusion. No passivity of thought. No passivity of will. No substance use. Patient has past history of suicidal behavior. Says he takes Gabapentin and Bupropion at home. We agreed to optimize dose of Bupropion. We agreed to explore augmentation with Venlafaxine if needed. He consented to treatment after we reviewed the risks and benefits. No access to weapons. He is cooperative with care.  He has agreed to treatment recommendations. He has agreed to communicate suicidal thoughts to staff if the thoughts becomes overwhelming.      Continued Clinical Symptoms:  Alcohol Use Disorder Identification Test Final Score (AUDIT): 0 The "Alcohol Use Disorders Identification Test", Guidelines for Use in Primary Care, Second Edition.  World Environmental consultant [facility]). Score between 0-7:  no or low risk or alcohol related problems. Score between 8-15:  moderate risk of alcohol related problems. Score between 16-19:  high risk of alcohol related problems. Score 20 or above:  warrants further diagnostic evaluation for alcohol dependence and treatment.   CLINICAL FACTORS:   Schizoaffective disorder   Musculoskeletal: Strength & Muscle Tone: within normal limits Gait & Station: normal Patient leans: N/A  Psychiatric Specialty Exam: Physical Exam  ROS  Blood pressure 138/75, pulse 92, temperature 97.8 F (36.6 C), temperature source Oral, resp. rate 20, height 5\' 11"  (1.803 m), weight 109.3 kg (241 lb), SpO2 100 %.Body mass index is 33.61 kg/m.  General Appearance: disheveled, in hospital clothing. Appears to be in some distress.   Eye Contact:  Good  Speech:  Decreased rate and tone  Volume:  Normal  Mood:  Overwhelmed by his subjective experience  Affect:  Blunted and mood congruent  Thought Process:  Linear  Orientation:  Full (Time, Place, and Person)  Thought Content:  Ruminating on his friend. No delusional theme. No preoccupation with violent thoughts. No hallucination in any modality.   Suicidal Thoughts:  Yes.  without intent/plan  Homicidal Thoughts:  No  Memory:  Unable to assess at this time.   Judgement:  Impaired  Insight:  Fair  Psychomotor Activity:  Increased  Concentration:  Concentration: Fair and Attention Span: Fair  Recall:  Unable to assess at this time.   Fund of Knowledge:  Unable to assess at this time.   Language:  Good  Akathisia:  Negative  Handed:    AIMS (if indicated):     Assets:  Desire for Improvement Financial Resources/Insurance Housing Resilience  ADL's:  Impaired  Cognition:  WNL  Sleep:  Number of Hours: 6.75      COGNITIVE FEATURES THAT CONTRIBUTE TO RISK:  None    SUICIDE RISK:   Mild:  Suicidal ideation of limited frequency, intensity, duration, and specificity.   There are no identifiable plans, no associated intent, mild dysphoria and related symptoms, good self-control (both objective and subjective assessment), few other risk factors, and identifiable protective factors, including available and accessible social support.  PLAN OF CARE:  1. Increase Bupropion XL to 450 mg daily 2. Continue Gabapentin 300 mg TID 3. Hold oral Invega as there is no psychotic component and his depot is still active 4. Q 15 minute check for suicide. 5. Monitor mood, behavior and interaction with peers 6. SW would gather collateral from his family    I certify that inpatient services furnished can reasonably be expected to improve the patient's condition.   Georgiann CockerVincent A Izediuno, MD 08/16/2017, 3:57 PM

## 2017-08-16 NOTE — Progress Notes (Signed)
Nursing Progress Note: 7p-7a D: Pt currently presents with a pleasant/brightens with conversation and stimuli/anxious affect and behavior. Pt states "My day was a 5 out of 10. I went to all the groups and my goal was to focus on my treatment plan. I really want to get well." Interacting appropriately with the milieu. Pt reports good sleep during the previous night with current medication regimen. Pt did attend wrap-up group.  A: Pt declined sleeping medicines. Pt's labs and vitals were monitored throughout the night. Pt supported emotionally and encouraged to express concerns and questions. Pt educated on medications.  R: Pt's safety ensured with 15 minute and environmental checks. Pt currently denies SI, HI, and AVH. Pt verbally contracts to seek staff if SI,HI, or AVH occurs and to consult with staff before acting on any harmful thoughts. Will continue to monitor.

## 2017-08-16 NOTE — Progress Notes (Signed)
Pt did not attend group. 

## 2017-08-16 NOTE — BHH Group Notes (Signed)
LCSW Group Therapy Note  08/16/2017   1:15-2:00pm  Type of Therapy and Topic:  Group Therapy: Anger Cues and Responses  Participation Level:  Active   Description of Group:   In this group, patients learned how to recognize the physical, cognitive, emotional, and behavioral responses they have to anger-provoking situations.  They identified a recent time they became angry and how they reacted.  They analyzed how their reaction was possibly beneficial and how it was possibly unhelpful.  The group discussed a variety of healthier coping skills that could help with such a situation in the future.  Deep breathing was practiced briefly.  Square breathing was illustrated and practiced.  A grounding technique using the five senses was explained and practice.  Marry GuanLavendar was used as an example of aromatherapy and passed around.  Therapeutic Goals: 1. Patients will remember their last incident of anger and how they felt emotionally and physically, what their thoughts were at the time, and how they behaved. 2. Patients will identify how their behavior at that time worked for them, as well as how it worked against them. 3. Patients will explore possible new behaviors to use in future anger situations. 4. Patients will learn that anger itself is normal and cannot be eliminated, and that healthier reactions can assist with resolving conflict rather than worsening situations.  Summary of Patient Progress:  The patient shared that his most recent time of anger was last summer and said he became angry when someone lied to him.  He was interested in the topic throughout group, participated in the discussion fully.  Therapeutic Modalities:   Cognitive Behavioral Therapy  Lynnell ChadMareida J Grossman-Orr  08/16/2017 12:00pm

## 2017-08-16 NOTE — [facility] Group Notes (Signed)
[facility] Group Notes:  (Nursing/MHT/Case Management/Adjunct)  Date:  08/16/2017  Time:  4:44 PM  Type of Therapy:  Psychoeducational Skills  Participation Level:  Active  Participation Quality:  Appropriate and Attentive  Affect:  Appropriate  Cognitive:  Alert and Appropriate  Insight:  Appropriate and Good  Engagement in Group:  Engaged  Modes of Intervention:  Discussion and Education  Summary of Progress/Problems:  Discussed Anger Management.  Patient was attentive and receptive.  Ronald Martinez 08/16/2017, 4:44 PM

## 2017-08-17 DIAGNOSIS — F1310 Sedative, hypnotic or anxiolytic abuse, uncomplicated: Secondary | ICD-10-CM

## 2017-08-17 DIAGNOSIS — F1210 Cannabis abuse, uncomplicated: Secondary | ICD-10-CM

## 2017-08-17 MED ORDER — PALIPERIDONE ER 3 MG PO TB24
3.0000 mg | ORAL_TABLET | Freq: Every day | ORAL | Status: DC
  Administered 2017-08-17 – 2017-08-20 (×4): 3 mg via ORAL
  Filled 2017-08-17 (×5): qty 1

## 2017-08-17 NOTE — Progress Notes (Signed)
Nursing Progress Note: 7p-7a D: Pt currently presents with a pleasant/animated/flat affect and behavior. Pt states "I had a good day interacting with everyone. It make me feel happy to be with them." Interacting appropriately with the milieu. Pt reports good sleep during the previous night with current medication regimen. Pt did attend wrap-up group.  A: Pt provided with medications per providers orders. Pt's labs and vitals were monitored throughout the night. Pt supported emotionally and encouraged to express concerns and questions. Pt educated on medications.  R: Pt's safety ensured with 15 minute and environmental checks. Pt currently denies SI, HI, and AVH. Pt verbally contracts to seek staff if SI,HI, or AVH occurs and to consult with staff before acting on any harmful thoughts. Will continue to monitor.

## 2017-08-17 NOTE — Progress Notes (Signed)
Adult Psychoeducational Group Note  Date:  08/17/2017 Time:  9:52 PM  Group Topic/Focus:  Wrap-Up Group:   The focus of this group is to help patients review their daily goal of treatment and discuss progress on daily workbooks.  Participation Level:  Active  Participation Quality:  Appropriate  Affect:  Appropriate  Cognitive:  Oriented  Insight: Appropriate  Engagement in Group:  Engaged  Modes of Intervention:  Socialization and Support  Additional Comments:  Patient attended and participated in group tonight. He reports have a good day. He socialized, watched television, went for groups and meals. He spoke with the NP also.  Lita MainsFrancis, Erice Ahles [facility] 08/17/2017, 9:52 PM

## 2017-08-17 NOTE — Progress Notes (Signed)
Adult Psychoeducational Group Note  Date:  08/17/2017 Time:  9:24 AM  Group Topic/Focus:  Goals Group:   The focus of this group is to help patients establish daily goals to achieve during treatment and discuss how the patient can incorporate goal setting into their daily lives to aide in recovery.  Participation Level:  Active  Participation Quality:  Appropriate  Affect:  Appropriate  Cognitive:  Appropriate  Insight: Good  Engagement in Group:  Improving  Modes of Intervention:  Discussion  Additional Comments:  Pt talked about his support system and he talked about wanting to improve things with his "people".  Pt discussed his goal for the day and was able to give words of encouragement to his peers. Wyn ForsterLeonard B Jaclyne Haverstick 08/17/2017, 9:24 AM

## 2017-08-17 NOTE — Progress Notes (Signed)
Pt presents with a flat affect and depressed mood. Pt reported ongoing depression and rates his depression level 5/10. Anxiety 5/10. Pt endorses passive SI. Pt denied any active SI during assessment. Pt verbally contracts for safety. Pt denied AVH. Pt reported fair sleep at bedtime. Medications reviewed with pt. Verbal support provided. Pt encouraged to attend groups. 15 minute checks performed for safety. Pt compliant with tx plan.

## 2017-08-17 NOTE — Progress Notes (Signed)
[facility] MD Progress Note  08/17/2017 11:41 AM Ronald Martinez  MRN:  [id number] Subjective:  37 yo male who presented with depression and suicidal ideations.  Today, he reports his sleep was "good" along with his appetite.  Mood has improved from a 5/10 to a 3/10, no suicidal ideations today.  He is responding to internal stimuli but denies.  Paces the halls at time because he says he is bored.  His ACT team and care is with [facility]. Principal Problem: Schizoaffective disorder, bipolar type (HCC) Diagnosis:   Patient Active Problem List   Diagnosis Date Noted  . Schizoaffective disorder, bipolar type (HCC) [F25.0] 08/16/2014    Priority: High  . 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 Cannabis use disorder, severe, dependence (HCC) [F12.20] 08/17/2014   Total Time spent with patient: 30 minutes  Past Psychiatric History: schizoaffective disorder, bipolar type  Past Medical History:  Past Medical History:  Diagnosis Date  . Asthma   . Bipolar 1 disorder (HCC)   . Depression   . Schizophrenia (HCC)    History reviewed. No pertinent surgical history. Family History:  Family History  Problem Relation Age of Onset  . Asthma Mother   . Alcoholism Other    Family Psychiatric  History: alcohol dependence Social History:  Social History   Substance and Sexual Activity  Alcohol Use Yes   Comment: only drinks on occasion     Social History   Substance and Sexual Activity  Drug Use Yes  . Types: Marijuana, Benzodiazepines    Social History   Socioeconomic History  . Marital status: Single    Spouse name: None  . Number of children: None  . Years of education: None  . Highest education level: None  Social Needs  . Financial resource strain: None  . Food insecurity - worry: None  . Food insecurity - inability: None  . Transportation needs - medical: None  . Transportation needs - non-medical: None  Occupational History  . None  Tobacco Use  . Smoking  status: Current Some Day Smoker    Packs/day: 1.00    Types: Cigarettes  . Smokeless tobacco: Never Used  Substance and Sexual Activity  . Alcohol use: Yes    Comment: only drinks on occasion  . Drug use: Yes    Types: Marijuana, Benzodiazepines  . Sexual activity: No  Other Topics Concern  . None  Social History Narrative  . None   Additional Social History:    Sleep: Good  Appetite:  Good  Current Medications: Current Facility-Administered Medications  Medication Dose Route Frequency Provider Last Rate Last Dose  . albuterol (PROVENTIL HFA;VENTOLIN HFA) 108 (90 Base) MCG/ACT inhaler 1-2 puff  1-2 puff Inhalation Q6H PRN Rankin, Shuvon B, NP      . buPROPion (WELLBUTRIN XL) 24 hr tablet 450 mg  450 mg Oral Daily Izediuno, Delight Ovens, MD   450 mg at 08/17/17 0803  . gabapentin (NEURONTIN) capsule 300 mg  300 mg Oral TID Georgiann Cocker, MD   300 mg at 08/17/17 0803  . hydrOXYzine (ATARAX/VISTARIL) tablet 25 mg  25 mg Oral TID PRN Rankin, Shuvon B, NP      . traZODone (DESYREL) tablet 50 mg  50 mg Oral QHS PRN Rankin, Shuvon B, NP        Lab Results: No results found for this or any previous visit (from the past 48 hour(s)).  Blood Alcohol level:  Lab Results  Component Value Date   ETH <  10 08/14/2017   ETH <5 12/07/2016    Metabolic Disorder Labs: Lab Results  Component Value Date   HGBA1C 5.7 (H) 12/11/2016   MPG 117 12/11/2016   MPG 114 01/10/2016   Lab Results  Component Value Date   PROLACTIN 23.9 (H) 12/11/2016   PROLACTIN 15.7 (H) 01/09/2016   Lab Results  Component Value Date   CHOL 184 12/11/2016   TRIG 65 12/11/2016   HDL 59 12/11/2016   CHOLHDL 3.1 12/11/2016   VLDL 13 12/11/2016   LDLCALC 112 (H) 12/11/2016   LDLCALC 102 (H) 01/10/2016    Physical Findings: AIMS: Facial and Oral Movements Muscles of Facial Expression: None, normal Lips and Perioral Area: None, normal Jaw: None, normal Tongue: None, normal,Extremity Movements Upper  (arms, wrists, hands, fingers): None, normal Lower (legs, knees, ankles, toes): None, normal, Trunk Movements Neck, shoulders, hips: None, normal, Overall Severity Severity of abnormal movements (highest score from questions above): None, normal Incapacitation due to abnormal movements: None, normal Patient's awareness of abnormal movements (rate only patient's report): No Awareness, Dental Status Current problems with teeth and/or dentures?: No Does patient usually wear dentures?: No  CIWA:    COWS:     Musculoskeletal: Strength & Muscle Tone: within normal limits Gait & Station: normal Patient leans: N/A  Psychiatric Specialty Exam: Physical Exam  Constitutional: He is oriented to person, place, and time. He appears well-developed and well-nourished.  HENT:  Head: Normocephalic.  Neck: Normal range of motion.  Respiratory: Effort normal.  Musculoskeletal: Normal range of motion.  Neurological: He is alert and oriented to person, place, and time.  Psychiatric: His speech is normal. Judgment and thought content normal. His mood appears anxious. He is actively hallucinating. Cognition and memory are normal. He exhibits a depressed mood.    Review of Systems  Psychiatric/Behavioral: Positive for depression and hallucinations. The patient is nervous/anxious.   All other systems reviewed and are negative.   Blood pressure (!) 145/91, pulse 96, temperature 98.1 F (36.7 C), temperature source Oral, resp. rate 20, height 5\' 11"  (1.803 m), weight 109.3 kg (241 lb), SpO2 100 %.Body mass index is 33.61 kg/m.  General Appearance: Casual  Eye Contact:  Good  Speech:  Normal Rate  Volume:  Normal  Mood:  Anxious and Depressed  Affect:  Blunt  Thought Process:  Coherent and Descriptions of Associations: Intact  Orientation:  Full (Time, Place, and Person)  Thought Content:  Rumination, hallucinations  Suicidal Thoughts:  No  Homicidal Thoughts:  No  Memory:  Immediate;   Fair Recent;    Fair Remote;   Fair  Judgement:  Fair  Insight:  Fair  Psychomotor Activity:  Normal  Concentration:  Concentration: Fair and Attention Span: Fair  Recall:  [employer]Fair  Fund of Knowledge:  Fair  Language:  Good  Akathisia:  No  Handed:  Right  AIMS (if indicated):     Assets:  Housing Leisure Time Physical Health Resilience Social Support  ADL's:  Intact  Cognition:  WNL  Sleep:  Number of Hours: 6.75   Treatment Plan Summary: Daily contact with patient to assess and evaluate symptoms and progress in treatment, Medication management and Plan schizoaffective disorder, bipolar type:   -Continue inpatient hospitalization  - Schizoaffective disorder - Start Invega 3 mg daily for psychosis  - Depression/mood disorder              - Continue Wellbutrin 450 mg daily for depression  -Anxiety -Continue hydroxyzine 25 mg po TID prn  anxiety             -Continue gabapentin 300 mg TID for anxiety  -Insomnia - Continue trazodone 50mg  po qhs prn insomnia  - Encourage participation in groups and therapeutic milieu  -Coordinate care with his ACT team, [facility]  -Disposition planning will be ongoing  Nanine Means, NP 08/17/2017, 11:41 AM

## 2017-08-17 NOTE — [facility] Group Notes (Signed)
[facility]  Date/Time:  08/17/2017  11:00AM-12:00PM  Type of Therapy and Topic:  Group Therapy:  Music and Mood  Participation Level:  Active   Description of Group: In this process group, members listened to a variety of genres of music and identified that different types of music evoke different responses.  Patients were encouraged to identify music that was soothing for them and music that was energizing for them.  Patients discussed how this knowledge can help with wellness and recovery in various ways including managing depression and anxiety as well as encouraging healthy sleep habits.    Therapeutic Goals: 1. Patients will explore the impact of different varieties of music on mood 2. Patients will verbalize the thoughts they have when listening to different types of music 3. Patients will identify music that is soothing to them as well as music that is energizing to them 4. Patients will discuss how to use this knowledge to assist in maintaining wellness and recovery 5. Patients will explore the use of music as a coping skill  Summary of Patient Progress:  At the beginning of group, patient expressed that he felt okay and at the end said he felt good.  He participated heavily throughout group.  Therapeutic Modalities: Solution Focused Brief Therapy Activity   Ambrose MantleMareida Grossman-Orr, LCSW

## 2017-08-18 DIAGNOSIS — F1290 Cannabis use, unspecified, uncomplicated: Secondary | ICD-10-CM

## 2017-08-18 DIAGNOSIS — F1390 Sedative, hypnotic, or anxiolytic use, unspecified, uncomplicated: Secondary | ICD-10-CM

## 2017-08-18 MED ORDER — ALUM & MAG HYDROXIDE-SIMETH 200-200-20 MG/5ML PO SUSP
30.0000 mL | ORAL | Status: DC | PRN
  Administered 2017-08-18 – 2017-08-20 (×3): 30 mL via ORAL
  Filled 2017-08-18 (×2): qty 30

## 2017-08-18 MED ORDER — MAGNESIUM HYDROXIDE 400 MG/5ML PO SUSP
30.0000 mL | Freq: Every day | ORAL | Status: DC | PRN
  Administered 2017-08-18: 30 mL via ORAL
  Filled 2017-08-18: qty 30

## 2017-08-18 NOTE — Progress Notes (Signed)
Recreation Therapy Notes  Date: 3.18.19 Time: 10:00 a.m.  Location: [address] Dayroom   Group Topic: Personal Development: Coping Skills   Goal Area(s) Addresses:  Goal 1.1: To improve coping skills  - Group will increase awareness on coping skills  - Group will identify at least three healthy coping skills they have  - Group will be able to identify how coping skills can improve their wellness  Behavioral Response: Appropriate   Intervention: Craft  Activity: Coping Strategies Fortune Teller: Patients received a Coping Strategies Fortune Teller. Patients were given fifteen minutes to color and list their top 8 healthy coping strategies. Patients then cut out their fortune tellers and followed Recreation Therapy Intern instructions on how to assemble their fortune teller. Once put together, patients had the opportunity to practice their coping strategies by using their fortune tellers with a partner.   Education: Coping Skills   Education Outcome: Acknowledges Education  Clinical Observations/Feedback: Patient attended and participated appropriately during Recreation Therapy group tx. Patient was able to identify at least three healthy coping skills he has. Patient actively listened during introduction and closing discussion. Patient successfully met Goal 1.1 (See Above).  Ranell Patrick, Recreation Therapy Intern   Ranell Patrick 08/18/2017 1:04 PM

## 2017-08-18 NOTE — Progress Notes (Signed)
Adult Psychoeducational Group Note  Date:  08/18/2017 Time:  1600  Group Topic/Focus:  Goals Group:   The focus of this group is to help patients establish daily goals to achieve during treatment and discuss how the patient can incorporate goal setting into their daily lives to aide in recovery.  Participation Level:  Did not attend.   Jarret Torre L   

## 2017-08-18 NOTE — Progress Notes (Signed)
Recreation Therapy Notes  INPATIENT RECREATION THERAPY ASSESSMENT  Patient Details Name: Ronald Martinez MRN: [id number] DOB: 09/12/1980 Today's Date: 08/18/2017       Information Obtained From: Patient  Able to Participate in Assessment/Interview: No  Patient Presentation: Sick   Patient was not able to participate in assessment due to being sick. Patient will be reassessed 3.19.19.   Sheryle Hailarian Hershey Knauer, Recreation Therapy Intern   Sheryle HailDarian Jazsmin Couse 08/18/2017, 3:39 PM

## 2017-08-18 NOTE — Progress Notes (Signed)
Adult Psychoeducational Group Note  Date:  08/18/2017 Time:  9:32 AM  Group Topic/Focus:  Goals Group:   The focus of this group is to help patients establish daily goals to achieve during treatment and discuss how the patient can incorporate goal setting into their daily lives to aide in recovery.  Participation Level:  Minimal  Participation Quality:  Appropriate  Affect:  Appropriate  Cognitive:  Appropriate  Insight: Limited  Engagement in Group:  Limited  Modes of Intervention:  Discussion and Education  Additional Comments:  Pt attend the Goals Group this morning and stated "i'm trying to figure out what is going on with me, but I'll be alright".  Lajada Janes E 08/18/2017, 9:32 AM

## 2017-08-18 NOTE — [facility] Group Notes (Signed)
LCSW Group Therapy Note   08/18/2017 1:15pm   Type of Therapy and Topic:  Group Therapy:  Overcoming Obstacles   Participation Level:  Did Not Attend   Description of Group:    In this group patients will be encouraged to explore what they see as obstacles to their own wellness and recovery. They will be guided to discuss their thoughts, feelings, and behaviors related to these obstacles. The group will process together ways to cope with barriers, with attention given to specific choices patients can make. Each patient will be challenged to identify changes they are motivated to make in order to overcome their obstacles. This group will be process-oriented, with patients participating in exploration of their own experiences as well as giving and receiving support and challenge from other group members.   Therapeutic Goals: 1. Patient will identify personal and current obstacles as they relate to admission. 2. Patient will identify barriers that currently interfere with their wellness or overcoming obstacles.  3. Patient will identify feelings, thought process and behaviors related to these barriers. 4. Patient will identify two changes they are willing to make to overcome these obstacles:      Summary of Patient Progress      Therapeutic Modalities:   Cognitive Behavioral Therapy Solution Focused Therapy Motivational Interviewing Relapse Prevention Therapy  Ronald RogueRodney B Maygan Koeller, LCSW 08/18/2017 3:07 PM

## 2017-08-18 NOTE — Tx Team (Signed)
Interdisciplinary Treatment and Diagnostic Plan Update  08/18/2017 Time of Session: 10:10 AM  Ronald Martinez MRN: [id number]  Principal Diagnosis: Schizoaffective disorder, bipolar type [facility])  Secondary Diagnoses: Principal Problem:   Schizoaffective disorder, bipolar type ([place])   Current Medications:  Current Facility-Administered Medications  Medication Dose Route Frequency Provider Last Rate Last Dose  . albuterol (PROVENTIL HFA;VENTOLIN HFA) 108 (90 Base) MCG/ACT inhaler 1-2 puff  1-2 puff Inhalation Q6H PRN Rankin, Shuvon B, NP      . buPROPion (WELLBUTRIN XL) 24 hr tablet 450 mg  450 mg Oral Daily Izediuno, Laruth Bouchard, MD   450 mg at 08/18/17 0806  . gabapentin (NEURONTIN) capsule 300 mg  300 mg Oral TID Artist Beach, MD   300 mg at 08/18/17 0806  . hydrOXYzine (ATARAX/VISTARIL) tablet 25 mg  25 mg Oral TID PRN Rankin, Shuvon B, NP   25 mg at 08/17/17 2249  . paliperidone (INVEGA) 24 hr tablet 3 mg  3 mg Oral Daily Patrecia Pour, NP   3 mg at 08/18/17 0806  . traZODone (DESYREL) tablet 50 mg  50 mg Oral QHS PRN Rankin, Shuvon B, NP   50 mg at 08/17/17 2249    PTA Medications: Medications Prior to Admission  Medication Sig Dispense Refill Last Dose  . albuterol (PROVENTIL HFA;VENTOLIN HFA) 108 (90 Base) MCG/ACT inhaler Inhale 1-2 puffs into the lungs every 6 (six) hours as needed for wheezing or shortness of breath. 1 Inhaler 0 Past Week at Unknown time  . benztropine (COGENTIN) 0.5 MG tablet Take 1 tablet (0.5 mg total) by mouth 2 (two) times daily. 60 tablet 0 Past Week at Unknown time  . gabapentin (NEURONTIN) 300 MG capsule Take 1 capsule (300 mg total) by mouth 2 (two) times daily. 60 capsule 0 Past Week at Unknown time  . hydrOXYzine (ATARAX/VISTARIL) 25 MG tablet Take 1 tablet (25 mg total) by mouth 3 (three) times daily as needed for anxiety. 30 tablet 0 Past Week at Unknown time  . Paliperidone Palmitate (INVEGA TRINZA IM) Inject into the muscle every 3 (three)  months.   06/2017  . traZODone (DESYREL) 50 MG tablet Take 1 tablet (50 mg total) by mouth at bedtime as needed for sleep. 30 tablet 0 Past Week at Unknown time  . citalopram (CELEXA) 10 MG tablet Take 1 tablet (10 mg total) by mouth daily. (Patient not taking: Reported on 08/15/2017) 30 tablet 0 Not Taking at Unknown time  . paliperidone (INVEGA) 6 MG 24 hr tablet Take 1 tablet (6 mg total) by mouth daily. (Patient not taking: Reported on 08/15/2017) 30 tablet 0 Not Taking at Unknown time    Patient Stressors: Financial difficulties Medication change or noncompliance Substance abuse  Patient Strengths: Capable of independent living [employer] forces logistics/support/administrative officer Supportive family/friends  Treatment Modalities: Medication Management, Group therapy, Case management,  1 to 1 session with clinician, Psychoeducation, Recreational therapy.   Physician Treatment Plan for Primary Diagnosis: Schizoaffective disorder, bipolar type ([place]) Long Term Goal(s): Improvement in symptoms so as ready for discharge  Short Term Goals: Ability to identify changes in lifestyle to reduce recurrence of condition will improve Ability to verbalize feelings will improve Ability to disclose and discuss suicidal ideas Ability to demonstrate self-control will improve Ability to identify and develop effective coping behaviors will improve Ability to maintain clinical measurements within normal limits will improve Compliance with prescribed medications will improve Ability to identify triggers associated with substance abuse/mental health issues will improve Ability to identify changes in lifestyle  to reduce recurrence of condition will improve Ability to verbalize feelings will improve Ability to disclose and discuss suicidal ideas Ability to demonstrate self-control will improve Ability to identify and develop effective coping behaviors will improve Ability to maintain clinical measurements within normal limits will  improve Compliance with prescribed medications will improve Ability to identify triggers associated with substance abuse/mental health issues will improve  Medication Management: Evaluate patient's response, side effects, and tolerance of medication regimen.  Therapeutic Interventions: 1 to 1 sessions, Unit Group sessions and Medication administration.  Evaluation of Outcomes: Progressing  Physician Treatment Plan for Secondary Diagnosis: Principal Problem:   Schizoaffective disorder, bipolar type ([place])   Long Term Goal(s): Improvement in symptoms so as ready for discharge  Short Term Goals: Ability to identify changes in lifestyle to reduce recurrence of condition will improve Ability to verbalize feelings will improve Ability to disclose and discuss suicidal ideas Ability to demonstrate self-control will improve Ability to identify and develop effective coping behaviors will improve Ability to maintain clinical measurements within normal limits will improve Compliance with prescribed medications will improve Ability to identify triggers associated with substance abuse/mental health issues will improve Ability to identify changes in lifestyle to reduce recurrence of condition will improve Ability to verbalize feelings will improve Ability to disclose and discuss suicidal ideas Ability to demonstrate self-control will improve Ability to identify and develop effective coping behaviors will improve Ability to maintain clinical measurements within normal limits will improve Compliance with prescribed medications will improve Ability to identify triggers associated with substance abuse/mental health issues will improve  Medication Management: Evaluate patient's response, side effects, and tolerance of medication regimen.  Therapeutic Interventions: 1 to 1 sessions, Unit Group sessions and Medication administration.  Evaluation of Outcomes: Progressing   RN Treatment Plan for  Primary Diagnosis: Schizoaffective disorder, bipolar type ([place]) Long Term Goal(s): Knowledge of disease and therapeutic regimen to maintain health will improve  Short Term Goals: Ability to verbalize feelings will improve, Ability to disclose and discuss suicidal ideas and Ability to identify and develop effective coping behaviors will improve  Medication Management: RN will administer medications as ordered by provider, will assess and evaluate patient's response and provide education to patient for prescribed medication. RN will report any adverse and/or side effects to prescribing provider.  Therapeutic Interventions: 1 on 1 counseling sessions, Psychoeducation, Medication administration, Evaluate responses to treatment, Monitor vital signs and CBGs as ordered, Perform/monitor CIWA, COWS, AIMS and Fall Risk screenings as ordered, Perform wound care treatments as ordered.  Evaluation of Outcomes: Progressing   LCSW Treatment Plan for Primary Diagnosis: Schizoaffective disorder, bipolar type ([place]) Long Term Goal(s): Safe transition to appropriate next level of care at discharge, Engage patient in therapeutic group addressing interpersonal concerns.  Short Term Goals: Engage patient in aftercare planning with referrals and resources  Therapeutic Interventions: Assess for all discharge needs, 1 to 1 time with Social worker, Explore available resources and support systems, Assess for adequacy in community support network, Educate family and significant other(s) on suicide prevention, Complete Psychosocial Assessment, Interpersonal group therapy.  Evaluation of Outcomes: Met  Return home, follow up [facility]   Progress in Treatment: Attending groups: Yes Participating in groups: Yes Taking medication as prescribed: Yes Toleration medication: Yes, no side effects reported at this time Family/Significant other contact made: Yes ACT team Patient understands diagnosis: Yes AEB asking for help with  depression, SI Discussing patient identified problems/goals with staff: Yes Medical problems stabilized or resolved: Yes Denies suicidal/homicidal ideation: Yes Issues/concerns  per patient self-inventory: None Other: N/A  New problem(s) identified: None identified at this time.   New Short Term/Long Term Goal(s): None identified at this time.   Discharge Plan or Barriers:   Reason for Continuation of Hospitalization:  Depression Medication stabilization Suicidal ideation   Estimated Length of Stay: 3-5 days  Attendees: Patient: Ronald Martinez 08/18/2017  10:10 AM  Physician: Maris Berger, MD 08/18/2017  10:10 AM  Nursing: Darrol Angel, RN 08/18/2017  10:10 AM  RN Care Manager: Lars Pinks, RN 08/18/2017  10:10 AM  Social Worker: Ripley Fraise 08/18/2017  10:10 AM  Recreational Therapist: Winfield Cunas 08/18/2017  10:10 AM  Other: Norberto Sorenson 08/18/2017  10:10 AM  Other:  08/18/2017  10:10 AM    Scribe for Treatment Team:  Roque Lias LCSW 08/18/2017 10:10 AM

## 2017-08-18 NOTE — Progress Notes (Signed)
[facility]  08/18/2017 6:38 PM Ronald ForesterKevin Martinez  MRN:  [id number]  Subjective: Ronald BeeKevin reports, "I came here because I was feeling very depressed & suicidal. But, I'm feeling good today. I'm taking my medicines. This is not the best time of the year for me. My best friend killed himself 17 years ago today, march 18th. When this time approaches, every bad feelings came back. I'm still having problems dealing with my friend's death. He was my body".   37 y.o AAM, single, lives alone. Background history of Schizoaffective disorder. Presented to the ER voluntarily. Reports increasing depression associated with suicidal thoughts. Has thoughts to cut self. Main stressor is upcoming anniversary of his best friend. Says he has not been coping well. Patient is followed by [facility]. He is on [place]. Had his shot in January. No hallucination in any modality. No persecutory delusion. No grandiose delusion. No other form of delusion. No passivity of thought. No passivity of will.  Principal Problem: Schizoaffective disorder, bipolar type ([facility])  Diagnosis:   Patient Active Problem List   Diagnosis Date Noted  . Schizoaffective disorder, bipolar type ([facility]) [F25.0] 08/16/2014    Priority: High  . 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 Cannabis use disorder, severe, dependence ([facility]) [F12.20] 08/17/2014   Total Time spent with patient: 15 minutes  Past Psychiatric History: See H&P  Past Medical History:  Past Medical History:  Diagnosis Date  . Asthma   . Bipolar 1 disorder ([facility])   . Depression   . Schizophrenia ([facility])    History reviewed. No pertinent surgical history. Family History:  Family History  Problem Relation Age of Onset  . Asthma Mother   . Alcoholism Other    Family Psychiatric  History: See H&P.  Social History:  Social History   Substance and Sexual Activity  Alcohol Use Yes   Comment: only drinks on occasion     Social History   Substance  and Sexual Activity  Drug Use Yes  . Types: Marijuana, Benzodiazepines    Social History   Socioeconomic History  . Marital status: Single    Spouse name: None  . Number of children: None  . Years of education: None  . Highest education level: None  Social Needs  . Financial resource strain: None  . Food insecurity - worry: None  . Food insecurity - inability: None  . Transportation needs - medical: None  . Transportation needs - non-medical: None  Occupational History  . None  Tobacco Use  . Smoking status: Current Some Day Smoker    Packs/day: 1.00    Types: Cigarettes  . Smokeless tobacco: Never Used  Substance and Sexual Activity  . Alcohol use: Yes    Comment: only drinks on occasion  . Drug use: Yes    Types: Marijuana, Benzodiazepines  . Sexual activity: No  Other Topics Concern  . None  Social History Narrative  . None   Additional Social History:    Sleep: Good  Appetite:  Good  Current Medications: Current Facility-Administered Medications  Medication Dose Route Frequency Provider Last Rate Last Dose  . albuterol (PROVENTIL HFA;VENTOLIN HFA) 108 (90 Base) MCG/ACT inhaler 1-2 puff  1-2 puff Inhalation Q6H PRN Rankin, Shuvon B, NP      . alum & mag hydroxide-simeth (MAALOX/MYLANTA) 200-200-20 MG/5ML suspension 30 mL  30 mL Oral Q4H PRN Nwoko, Agnes I, NP   30 mL at 08/18/17 1443  . buPROPion (WELLBUTRIN XL) 24 hr tablet 450  mg  450 mg Oral Daily Georgiann Cocker, MD   450 mg at 08/18/17 0806  . gabapentin (NEURONTIN) capsule 300 mg  300 mg Oral TID Georgiann Cocker, MD   300 mg at 08/18/17 1712  . hydrOXYzine (ATARAX/VISTARIL) tablet 25 mg  25 mg Oral TID PRN Rankin, Shuvon B, NP   25 mg at 08/17/17 2249  . magnesium hydroxide (MILK OF MAGNESIA) suspension 30 mL  30 mL Oral Daily PRN Armandina Stammer I, NP   30 mL at 08/18/17 1724  . paliperidone (INVEGA) 24 hr tablet 3 mg  3 mg Oral Daily Charm Rings, NP   3 mg at 08/18/17 0806  . traZODone (DESYREL)  tablet 50 mg  50 mg Oral QHS PRN Rankin, Shuvon B, NP   50 mg at 08/17/17 2249    Lab Results: No results found for this or any previous visit (from the past 48 hour(s)).  Blood Alcohol level:  Lab Results  Component Value Date   ETH <10 08/14/2017   ETH <5 12/07/2016    Metabolic Disorder Labs: Lab Results  Component Value Date   HGBA1C 5.7 (H) 12/11/2016   MPG 117 12/11/2016   MPG 114 01/10/2016   Lab Results  Component Value Date   PROLACTIN 23.9 (H) 12/11/2016   PROLACTIN 15.7 (H) 01/09/2016   Lab Results  Component Value Date   CHOL 184 12/11/2016   TRIG 65 12/11/2016   HDL 59 12/11/2016   CHOLHDL 3.1 12/11/2016   VLDL 13 12/11/2016   LDLCALC 112 (H) 12/11/2016   LDLCALC 102 (H) 01/10/2016   Physical Findings: AIMS: Facial and Oral Movements Muscles of Facial Expression: None, normal Lips and Perioral Area: None, normal Jaw: None, normal Tongue: None, normal,Extremity Movements Upper (arms, wrists, hands, fingers): None, normal Lower (legs, knees, ankles, toes): None, normal, Trunk Movements Neck, shoulders, hips: None, normal, Overall Severity Severity of abnormal movements (highest score from questions above): None, normal Incapacitation due to abnormal movements: None, normal Patient's awareness of abnormal movements (rate only patient's report): No Awareness, Dental Status Current problems with teeth and/or dentures?: No Does patient usually wear dentures?: No  CIWA:    COWS:     Musculoskeletal: Strength & Muscle Tone: within normal limits Gait & Station: normal Patient leans: N/A  Psychiatric Specialty Exam: Physical Exam  Constitutional: He is oriented to person, place, and time. He appears well-developed and well-nourished.  HENT:  Head: Normocephalic.  Neck: Normal range of motion.  Respiratory: Effort normal.  Musculoskeletal: Normal range of motion.  Neurological: He is alert and oriented to person, place, and time.  Psychiatric: His  speech is normal. Judgment and thought content normal. His mood appears anxious. He is actively hallucinating. Cognition and memory are normal. He exhibits a depressed mood.    Review of Systems  Psychiatric/Behavioral: Positive for depression and hallucinations. The patient is nervous/anxious.   All other systems reviewed and are negative.   Blood pressure 135/75, pulse 99, temperature 97.7 F (36.5 C), temperature source Oral, resp. rate 18, height 5\' 11"  (1.803 m), weight 109.3 kg (241 lb), SpO2 100 %.Body mass index is 33.61 kg/m.  General Appearance: Casual  Eye Contact:  Good  Speech:  Normal Rate  Volume:  Normal  Mood:  Anxious and Depressed  Affect:  Blunt  Thought Process:  Coherent and Descriptions of Associations: Intact  Orientation:  Full (Time, Place, and Person)  Thought Content:  Rumination, hallucinations  Suicidal Thoughts:  No  Homicidal Thoughts:  No  Memory:  Immediate;   Fair Recent;   Fair Remote;   Fair  Judgement:  Fair  Insight:  Fair  Psychomotor Activity:  Normal  Concentration:  Concentration: Fair and Attention Span: Fair  Recall:  [employer] of Knowledge:  Fair  Language:  Good  Akathisia:  No  Handed:  Right  AIMS (if indicated):     Assets:  Housing Leisure Time Physical Health Resilience Social Support  ADL's:  Intact  Cognition:  WNL  Sleep:  Number of Hours: 6.25   Treatment Plan Summary: Daily contact with patient to assess and evaluate symptoms and progress in treatment, Medication management and Plan schizoaffective disorder, bipolar type:   -Continue inpatient hospitalization  - Schizoaffective disorder - Continue Invega 3 mg daily for psychosis  - Depression/mood disorder              - Continue Wellbutrin 450 mg daily for depression  -Anxiety -Continue hydroxyzine 25 mg po TID prn anxiety             -Continue gabapentin 300 mg TID for anxiety  -Insomnia - Continue trazodone  50mg  po qhs prn insomnia  - Encourage participation in groups and therapeutic milieu  -Coordinate care with his ACT team, [facility]  -Disposition planning will be ongoing  Armandina Stammer, NP 08/18/2017, 6:38 PMPatient ID: Ronald Martinez, male   DOB: Nov 09, 1980, 37 y.o.   MRN: [id number]

## 2017-08-18 NOTE — Progress Notes (Signed)
Pt presents with a flat affect and depressed mood. Pt reports decreased depression and anxiety. Pt rates depression 4/10. Anxiety 5/10. Hopelessness 3/10. Pt denies SI/HI. Pt reports fair sleep at HS. Pt reported that he's tolerating his meds well and denies any side effects to meds. Pt compliant with attending scheduled groups. Orders reviewed with pt. Verbal support provided. 15 minute checks performed for safety.

## 2017-08-19 MED ORDER — ONDANSETRON 4 MG PO TBDP
ORAL_TABLET | ORAL | Status: AC
  Filled 2017-08-19: qty 1

## 2017-08-19 MED ORDER — PANTOPRAZOLE SODIUM 40 MG PO TBEC
40.0000 mg | DELAYED_RELEASE_TABLET | Freq: Every day | ORAL | Status: DC
  Administered 2017-08-19 – 2017-08-20 (×2): 40 mg via ORAL
  Filled 2017-08-19 (×6): qty 1

## 2017-08-19 MED ORDER — ONDANSETRON 4 MG PO TBDP
4.0000 mg | ORAL_TABLET | Freq: Three times a day (TID) | ORAL | Status: DC | PRN
  Administered 2017-08-19: 4 mg via ORAL

## 2017-08-19 NOTE — Progress Notes (Signed)
Did not attend group 

## 2017-08-19 NOTE — [facility] Group Notes (Signed)
LCSW Group Therapy Note  08/19/2017 1:15pm  Type of Therapy and Topic: Group Therapy: Holding on to Grudges   Participation Level: Minimal   Description of Group:  In this group patients will be asked to explore and define a grudge. Patients will be guided to discuss their thoughts, feelings, and reasons as to why people have grudges. Patients will process the impact grudges have on daily life and identify thoughts and feelings related to holding grudges. Facilitator will challenge patients to identify ways to let go of grudges and the benefits this provides. Patients will be confronted to address why one struggles letting go of grudges. Lastly, patients will identify feelings and thoughts related to what life would look like without grudges. This group will be process-oriented, with patients participating in exploration of their own experiences, giving and receiving support, and processing challenge from other group members.  Therapeutic Goals:  1. Patient will identify specific grudges related to their personal life.  2. Patient will identify feelings, thoughts, and beliefs around grudges.  3. Patient will identify how one releases grudges appropriately.  4. Patient will identify situations where they could have let go of the grudge, but instead chose to hold on.   Summary of Patient Progress:  Stayed the entire time, engaged throughout.  Little interaction, but did state that he has learned how to forgive others and himself "because grudges are a negative thing, and I'm about being positive."   Therapeutic Modalities:  Cognitive Behavioral Therapy  Solution Focused Therapy  Motivational Interviewing  Brief Therapy   Ida RogueRodney B Kennon Encinas, LCSW 08/19/2017 1:23 PM

## 2017-08-19 NOTE — Progress Notes (Signed)
  DATA ACTION RESPONSE  Objective- Pt. is visible in the room, seen sleeping for majority of shift. Pt did not attend wrap-up group.Presents with a flat/anxious affect and mood. No new c/o's.  Subjective- Denies having any SI/HI/AVH/Pain at this time.Is cooperative and remains safe on the unit.  1:1 interaction in private to establish rapport. Encouragement, education, & support given from staff.    Safety maintained with Q 15 checks. Continue with POC.

## 2017-08-19 NOTE — Progress Notes (Signed)
D: Writer was informed by the mht that the pt was vomiting. When he finished the pt informed the writer that his stomach was feeling good". When asked, pt stated, "it started yesterday". Writer instructed mht to get a set of vitals. Bp 127/97 114 (sitting), and 107/73 128 (standing), temp was 98.9.  Pt also informed that he "burbs and it smells like eggs". Writer gave pt prn maalox and ice chips with small amount of ginger ale.   A:  Support and encouragement was offered. 15 min checks continued for safety.  R: Pt remains safe.

## 2017-08-19 NOTE — Progress Notes (Signed)
[facility] MD Progress Note  08/19/2017 4:25 PM [name]  MRN:  [id number]  Subjective: Ronald Martinez reports, "I'm fine. My mood is good. I'm sleeping well. I have been attending all the groups. I'm taking my medicines. I am not feeling depressed. The only thing is I got this bad indigestion that leaves a sour taste in my mouth. When am I going home? I feel ready to go home".  37 y.o AAM, single, lives alone. Background history of Schizoaffective disorder. Presented to the ER voluntarily. Reports increasing depression associated with suicidal thoughts. Has thoughts to cut self. Main stressor is upcoming anniversary of his best friend. Says he has not been coping well. Patient is followed by [facility]. He is on [place]. Had his shot in January. No hallucination in any modality. No persecutory delusion. No grandiose delusion. No other form of delusion. No passivity of thought. No passivity of will. 08-19-17, Ronald Martinez is seen, chart reviewed. He is alert, oriented & aware of situation. He is visible on the unit, attending group sessions. He is taking & tolerating his medications. He currently denies any SIHI, AVH, delusional thoughts or paranoia. He does not appear to be responding to any internal stimuli. His complaints today is indigestion. Started on Protonix 40 mg daily. He says he is ready to go home.  Principal Problem: Schizoaffective disorder, bipolar type (HCC)  Diagnosis:   Patient Active Problem List   Diagnosis Date Noted  . Schizoaffective disorder, bipolar type (HCC) [F25.0] 08/16/2014    Priority: High  . 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 Cannabis use disorder, severe, dependence (HCC) [F12.20] 08/17/2014   Total Time spent with patient: 15 minutes  Past Psychiatric History: See H&P  Past Medical History:  Past Medical History:  Diagnosis Date  . Asthma   . Bipolar 1 disorder (HCC)   . Depression   . Schizophrenia (HCC)    History reviewed. No pertinent  surgical history. Family History:  Family History  Problem Relation Age of Onset  . Asthma Mother   . Alcoholism Other    Family Psychiatric  History: See H&P.  Social History:  Social History   Substance and Sexual Activity  Alcohol Use Yes   Comment: only drinks on occasion     Social History   Substance and Sexual Activity  Drug Use Yes  . Types: Marijuana, Benzodiazepines    Social History   Socioeconomic History  . Marital status: Single    Spouse name: None  . Number of children: None  . Years of education: None  . Highest education level: None  Social Needs  . Financial resource strain: None  . Food insecurity - worry: None  . Food insecurity - inability: None  . Transportation needs - medical: None  . Transportation needs - non-medical: None  Occupational History  . None  Tobacco Use  . Smoking status: Current Some Day Smoker    Packs/day: 1.00    Types: Cigarettes  . Smokeless tobacco: Never Used  Substance and Sexual Activity  . Alcohol use: Yes    Comment: only drinks on occasion  . Drug use: Yes    Types: Marijuana, Benzodiazepines  . Sexual activity: No  Other Topics Concern  . None  Social History Narrative  . None   Additional Social History:    Sleep: Good  Appetite:  Good  Current Medications: Current Facility-Administered Medications  Medication Dose Route Frequency Provider Last Rate Last Dose  . albuterol (PROVENTIL HFA;VENTOLIN HFA) 108 (  90 Base) MCG/ACT inhaler 1-2 puff  1-2 puff Inhalation Q6H PRN Rankin, Shuvon B, NP      . alum & mag hydroxide-simeth (MAALOX/MYLANTA) 200-200-20 MG/5ML suspension 30 mL  30 mL Oral Q4H PRN Armandina Stammer I, NP   30 mL at 08/19/17 0805  . buPROPion (WELLBUTRIN XL) 24 hr tablet 450 mg  450 mg Oral Daily Izediuno, Delight Ovens, MD   450 mg at 08/19/17 0804  . gabapentin (NEURONTIN) capsule 300 mg  300 mg Oral TID Georgiann Cocker, MD   300 mg at 08/19/17 1617  . hydrOXYzine (ATARAX/VISTARIL) tablet  25 mg  25 mg Oral TID PRN Rankin, Shuvon B, NP   25 mg at 08/18/17 2354  . magnesium hydroxide (MILK OF MAGNESIA) suspension 30 mL  30 mL Oral Daily PRN Armandina Stammer I, NP   30 mL at 08/18/17 1724  . ondansetron (ZOFRAN-ODT) disintegrating tablet 4 mg  4 mg Oral Q8H PRN Micheal Likens, MD   4 mg at 08/19/17 1259  . paliperidone (INVEGA) 24 hr tablet 3 mg  3 mg Oral Daily Charm Rings, NP   3 mg at 08/19/17 0805  . pantoprazole (PROTONIX) EC tablet 40 mg  40 mg Oral Daily Lupe Bonner I, NP      . traZODone (DESYREL) tablet 50 mg  50 mg Oral QHS PRN Rankin, Shuvon B, NP   50 mg at 08/18/17 2354    Lab Results: No results found for this or any previous visit (from the past 48 hour(s)).  Blood Alcohol level:  Lab Results  Component Value Date   ETH <10 08/14/2017   ETH <5 12/07/2016    Metabolic Disorder Labs: Lab Results  Component Value Date   HGBA1C 5.7 (H) 12/11/2016   MPG 117 12/11/2016   MPG 114 01/10/2016   Lab Results  Component Value Date   PROLACTIN 23.9 (H) 12/11/2016   PROLACTIN 15.7 (H) 01/09/2016   Lab Results  Component Value Date   CHOL 184 12/11/2016   TRIG 65 12/11/2016   HDL 59 12/11/2016   CHOLHDL 3.1 12/11/2016   VLDL 13 12/11/2016   LDLCALC 112 (H) 12/11/2016   LDLCALC 102 (H) 01/10/2016   Physical Findings: AIMS: Facial and Oral Movements Muscles of Facial Expression: None, normal Lips and Perioral Area: None, normal Jaw: None, normal Tongue: None, normal,Extremity Movements Upper (arms, wrists, hands, fingers): None, normal Lower (legs, knees, ankles, toes): None, normal, Trunk Movements Neck, shoulders, hips: None, normal, Overall Severity Severity of abnormal movements (highest score from questions above): None, normal Incapacitation due to abnormal movements: None, normal Patient's awareness of abnormal movements (rate only patient's report): No Awareness, Dental Status Current problems with teeth and/or dentures?: No Does  patient usually wear dentures?: No  CIWA:    COWS:     Musculoskeletal: Strength & Muscle Tone: within normal limits Gait & Station: normal Patient leans: N/A  Psychiatric Specialty Exam: Physical Exam  Constitutional: He is oriented to person, place, and time. He appears well-developed and well-nourished.  HENT:  Head: Normocephalic.  Neck: Normal range of motion.  Respiratory: Effort normal.  Musculoskeletal: Normal range of motion.  Neurological: He is alert and oriented to person, place, and time.  Psychiatric: His speech is normal. Judgment and thought content normal. His mood appears anxious. He is actively hallucinating. Cognition and memory are normal. He exhibits a depressed mood.    Review of Systems  Psychiatric/Behavioral: Positive for depression and hallucinations. The patient is nervous/anxious.  All other systems reviewed and are negative.   Blood pressure (!) 95/52, pulse (!) 138, temperature 98.9 F (37.2 C), temperature source Oral, resp. rate 16, height 5\' 11"  (1.803 m), weight 109.3 kg (241 lb), SpO2 100 %.Body mass index is 33.61 kg/m.  General Appearance: Casual  Eye Contact:  Good  Speech:  Normal Rate  Volume:  Normal  Mood:  Anxious and Depressed  Affect:  Blunt  Thought Process:  Coherent and Descriptions of Associations: Intact  Orientation:  Full (Time, Place, and Person)  Thought Content:  Rumination, hallucinations  Suicidal Thoughts:  No  Homicidal Thoughts:  No  Memory:  Immediate;   Fair Recent;   Fair Remote;   Fair  Judgement:  Fair  Insight:  Fair  Psychomotor Activity:  Normal  Concentration:  Concentration: Fair and Attention Span: Fair  Recall:  FiservFair  Fund of Knowledge:  Fair  Language:  Good  Akathisia:  No  Handed:  Right  AIMS (if indicated):     Assets:  Housing Leisure Time Physical Health Resilience Social Support  ADL's:  Intact  Cognition:  WNL  Sleep:  Number of Hours: 4.25   Treatment Plan Summary: Daily  contact with patient to assess and evaluate symptoms and progress in treatment, Medication management and Plan schizoaffective disorder, bipolar type:   -Continue inpatient hospitalization  - Will continue today 08/19/2017 plan as below except where it is noted.  - Schizoaffective disorder - Continue Invega 3 mg daily for psychosis  - Depression/mood disorder              - Continue Wellbutrin 450 mg daily for depression  -Anxiety -Continue hydroxyzine 25 mg po TID prn anxiety             -Continue gabapentin 300 mg TID for anxiety  -Insomnia - Continue trazodone 50mg  po qhs prn insomnia.  GERD.     - Initiated Protonix 40 mg po daily for indigestion.  - Encourage participation in groups and therapeutic milieu  -Coordinate care with his ACT team, PSI  -Disposition planning will be ongoing  Armandina StammerAgnes Phelan Schadt, NP, pmhnp, fnp-bc. 08/19/2017, 4:25 PMPatient ID: Ronald ForesterKevin Martinez, male   DOB: 09/14/1980, 37 y.o.   MRN: [id number]

## 2017-08-19 NOTE — Progress Notes (Signed)
Pt presents with a flat affect and depressed mood. Pt reports decreased depression and anxiety. Pt rates depression 4/10. Anxiety 5/10. Hopelessness 4/10. Pt denies SI/HI. Pt reports fair sleep at HS. Pt c/o vomiting at lunch time. Zofran given for nausea.Pt reported that he's tolerating his meds well and denies any side effects to meds. Pt compliant with attending scheduled groups. Orders reviewed with pt. Verbal support provided. 15 minute checks performed for safety.

## 2017-08-19 NOTE — Progress Notes (Signed)
Recreation Therapy Notes   Date: 3.19.19 Time: 10:00 a.m.  Location: [address] Dayroom   Group Topic: Self-Expression, Radiographer, therapeutic, Leisure Education   Goal Area(s) Addresses:  - Patient will identify the importance of self-expression  - Patient will identify how to express themselves  - Patient will identify how art can be used as a Technical sales engineer  - Patient will report enjoyment and feeling of relaxation from activity  - Patient will participate in Recreation Therapy group treatment   Behavioral Response: Engaged   Intervention: Art   Activity: Expressive Arts: Patients had the opportunity to express themselves through the use of art by painting their feelings or thoughts on a canvas   Education:Self-Expression, Coping Skills, Leisure Education, Stress Management   Education Outcome: Acknowledges education  Clinical Observations/Feedback: Patient attended and participated appropriately during Recreation Therapy group treatment successfully identifying the importance of self-expression. Patient reported feeling a sense of relaxation and a distraction from thoughts. Patient successfully met Goal 1.1 (see above).   Ranell Patrick, Recreation Therapy Intern  Ranell Patrick 08/19/2017 12:50 PM

## 2017-08-19 NOTE — Progress Notes (Signed)
Recreation Therapy Notes  INPATIENT RECREATION THERAPY ASSESSMENT  Patient Details Name: Warnell ForesterKevin Spindel MRN: [id number] DOB: 03/04/1981 Today's Date: 08/19/2017       Information Obtained From: Patient  Able to Participate in Assessment/Interview: Yes  Patient Presentation: Responsive  Reason for Admission (Per Patient): Suicide Attempt Patient reports being admitted into the hospital because of depression and suicide attempt due to the passing of a friend seventeen years ago.   Patient Stressors: Friends  Patient reports that his friend has been a stressor. Patient reports that his friend committed suicide seventeen years ago and that the date has recently passed (March 18th.)  Coping Skills:   Isolation, Music, Exercise, TV, Deep Breathing, Meditate, Substance Abuse, Prayer, Read  Leisure Interests (2+):  Ashby DawesNature - Other (Comment)  Frequency of Recreation/Participation: Monthly  Awareness of Community Resources:  Yes  Community Resources:  Library, [employer], Research scientist (physical sciences)Movie Theaters  Current Use: Yes  Expressed Interest in State Street CorporationCommunity Resource Information: No  [place] EnergyCounty of Residence:  [place]   Patient Main Form of Transportation: Therapist, musicublic Transportation  Patient Strengths:  Playing cards  Patient Identified Areas of Improvement:  To improve self-esteem  Patient Goal for Hospitalization:  To work towards discharge   Current SI (including self-harm):  No  Current HI:  No  Current AVH: No  Staff Intervention Plan: Group Attendance  Consent to Intern Participation: Yes  Sheryle Hailarian Joylyn Duggin, Recreation Therapy Intern   Sheryle HailDarian Karren Newland 08/19/2017, 2:25 PM

## 2017-08-20 MED ORDER — PANTOPRAZOLE SODIUM 40 MG PO TBEC: 40.0000 mg | DELAYED_RELEASE_TABLET | Freq: Every day | ORAL | 0 refills | Status: DC

## 2017-08-20 MED ORDER — BUPROPION HCL ER (XL) 450 MG PO TB24: 450.0000 mg | ORAL_TABLET | Freq: Every day | ORAL | 0 refills | Status: DC

## 2017-08-20 MED ORDER — GABAPENTIN 300 MG PO CAPS: 300.0000 mg | ORAL_CAPSULE | Freq: Three times a day (TID) | ORAL | 0 refills | Status: DC

## 2017-08-20 MED ORDER — PALIPERIDONE ER 6 MG PO TB24: 6.0000 mg | ORAL_TABLET | Freq: Every day | ORAL | 0 refills | Status: DC

## 2017-08-20 MED ORDER — PALIPERIDONE ER 6 MG PO TB24
6.0000 mg | ORAL_TABLET | Freq: Every day | ORAL | Status: DC
  Filled 2017-08-20 (×2): qty 1

## 2017-08-20 MED ORDER — TRAZODONE HCL 50 MG PO TABS: 50.0000 mg | ORAL_TABLET | Freq: Every evening | ORAL | 0 refills | Status: DC | PRN

## 2017-08-20 MED ORDER — ALBUTEROL SULFATE HFA 108 (90 BASE) MCG/ACT IN AERS: 1.0000 | INHALATION_SPRAY | Freq: Four times a day (QID) | RESPIRATORY_TRACT | 0 refills | Status: DC | PRN

## 2017-08-20 MED ORDER — HYDROXYZINE HCL 25 MG PO TABS: 25.0000 mg | ORAL_TABLET | Freq: Three times a day (TID) | ORAL | 0 refills | Status: DC | PRN

## 2017-08-20 NOTE — [facility] Suicide Risk Assessment (Signed)
[facility] INPATIENT:  Family/Significant Other Suicide Prevention Education  Suicide Prevention Education:  Patient Refusal for Family/Significant Other Suicide Prevention Education: The patient Ronald Martinez has refused to provide written consent for family/significant other to be provided Family/Significant Other Suicide Prevention Education during admission and/or prior to discharge.  Physician notified.  Ronald Martinez 08/20/2017, 3:21 PM

## 2017-08-20 NOTE — [facility] Suicide Risk Assessment (Signed)
[facility] Discharge Suicide Risk Assessment   Principal Problem: Schizoaffective disorder, bipolar type [facility]) Discharge Diagnoses:  Patient Active Problem List   Diagnosis Date Noted  . 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 Cannabis use disorder, severe, dependence (HCC) [F12.20] 08/17/2014  . Schizoaffective disorder, bipolar type (HCC) [F25.0] 08/16/2014    Total Time spent with patient: 30 minutes  Musculoskeletal: Strength & Muscle Tone: within normal limits Gait & Station: normal Patient leans: N/A  Psychiatric Specialty Exam: Review of Systems  Constitutional: Negative for chills and fever.  Respiratory: Negative for cough and shortness of breath.   Cardiovascular: Negative for chest pain.  Gastrointestinal: Positive for diarrhea and nausea. Negative for abdominal pain, heartburn and vomiting.  Psychiatric/Behavioral: Negative for depression, hallucinations and suicidal ideas. The patient is not nervous/anxious.     Blood pressure (!) 119/58, pulse 92, temperature 98.5 F (36.9 C), temperature source Oral, resp. rate 20, height 5\' 11"  (1.803 m), weight 109.3 kg (241 lb), SpO2 100 %.Body mass index is 33.61 kg/m.  General Appearance: Casual  Eye Contact::  Good  Speech:  Clear and Coherent and Normal Rate  Volume:  Normal  Mood:  Euthymic  Affect:  Appropriate and Congruent  Thought Process:  Coherent and Goal Directed  Orientation:  Full (Time, Place, and Person)  Thought Content:  Logical  Suicidal Thoughts:  No  Homicidal Thoughts:  No  Memory:  Immediate;   Fair Recent;   Fair Remote;   Fair  Judgement:  Fair  Insight:  Fair  Psychomotor Activity:  Normal  Concentration:  Fair  Recall:  [employer] of Knowledge:Fair  Language: Fair  Akathisia:  No  Handed:    AIMS (if indicated):     Assets:  Communication Skills Resilience  Sleep:  Number of Hours: 6.75  Cognition: WNL  ADL's:  Intact   Mental Status Per Nursing Assessment::    On Admission:  NA  Demographic Factors:  Male and Low socioeconomic status  Loss Factors: Financial problems/change in socioeconomic status  Historical Factors: Impulsivity  Risk Reduction Factors:   Positive social support, Positive therapeutic relationship and Positive coping skills or problem solving skills  Continued Clinical Symptoms:  Schizophrenia:   Paranoid or undifferentiated type  Cognitive Features That Contribute To Risk:  None    Suicide Risk:  Minimal: No identifiable suicidal ideation.  Patients presenting with no risk factors but with morbid ruminations; may be classified as minimal risk based on the severity of the depressive symptoms  Follow-up Information    [facility] Follow up.   Why:  Someone from the ACT team will see you the day after d/c Contact information: [address] [place] [address] [phone]         Subjective Data:  Ronald Martinez is a 37 y/o M with history of schizoaffective disorder who was admitted from ED voluntarily with worsening depression and SI with thoughts to cut himself. Pt was restarted on home medications from his ACT team including bupropion at a higher dose. Pt receives [place] and he was given oral Invega to supplement his depot injection. He reported incremental improvement of his presenting symptoms during his stay.  Today upon evaluation, pt shares, "It's going good." He has no specific complaints today aside from some GI upset and mild diarrhea, but he received Maalox which he feels has been helpful. He denies SI/HI/AH/VH. He is sleeping well. His appetite is good. He is in agreement to continue with his ACT  team and to continue his current treatment regimen. He was able to engage in safety planning [facility] or contact emergency services if he feels unable to maintain his own safety or the safety of others. Pt had no further questions, comments, or concerns.    Plan Of Care/Follow-up recommendations:   -Discharge to outpatient level of care (ACT team)  - Schizoaffective disorder -Change Invega 3mg  po qDay toInvega 6mg  po qDay for psychosis  - Depression/mood disorder - Continue Wellbutrin 450 mg daily for depression  -Anxiety -Continue hydroxyzine 25 mg po TID prn anxiety             -Continue gabapentin 300 mg TID for anxiety  -Insomnia - Continue trazodone 50mg  po qhs prn insomnia.  GERD.     - Continue Protonix 40 mg po daily for indigestion.  Activity:  as tolerated Diet:  normal Tests:  NA Other:  see above for DC plan  Ronald Likenshristopher T Tydarius Yawn, MD 08/20/2017, 10:19 AM

## 2017-08-20 NOTE — Progress Notes (Signed)
Patient ID: Ronald ForesterKevin Hu, male   DOB: 05/17/1981, 37 y.o.   MRN: [id number] Patient discharged to home/self care.  Upon discharge patient was noted to be in good spirits.  Patient denies SI, HI and AVH and expressed desire to continue treatment within the community as stated in his discharge instructions. Patient acknowledged understanding of all discharge information and the receipt of all personal affects from his locker.

## 2017-08-20 NOTE — Discharge Summary (Addendum)
Physician Discharge Summary Note  Patient:  Ronald Martinez is an 37 y.o., male MRN:  [id number] DOB:  [birth year] Patient phone:  [phone] (home)  Patient address:   [address] [place] [address],  Total Time spent with patient: Greater than 30 minutes  Date of Admission:  08/15/2017 Date of Discharge: 08-20-17  Reason for Admission:   Principal Problem: Schizoaffective disorder, bipolar type [facility])  Discharge Diagnoses: Patient Active Problem List   Diagnosis Date Noted  . Schizoaffective disorder, bipolar type (HCC) [F25.0] 08/16/2014    Priority: High  . 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 Cannabis use disorder, severe, dependence (HCC) [F12.20] 08/17/2014   Past Psychiatric History: Schizoaffective disorder, Benzodiazepine use disorder, Cannabis use disorder.  Past Medical History:  Past Medical History:  Diagnosis Date  . Asthma   . Bipolar 1 disorder (HCC)   . Depression   . Schizophrenia (HCC)    History reviewed. No pertinent surgical history. Family History:  Family History  Problem Relation Age of Onset  . Asthma Mother   . Alcoholism Other    Family Psychiatric  History: See H&P.Marland Kitchen  Social History:  Social History   Substance and Sexual Activity  Alcohol Use Yes   Comment: only drinks on occasion     Social History   Substance and Sexual Activity  Drug Use Yes  . Types: Marijuana, Benzodiazepines    Social History   Socioeconomic History  . Marital status: Single    Spouse name: Not on file  . Number of children: Not on file  . Years of education: Not on file  . Highest education level: Not on file  Occupational History  . Not on file  Social Needs  . Financial resource strain: Not on file  . Food insecurity:    Worry: Not on file    Inability: Not on file  . Transportation needs:    Medical: Not on file    Non-medical: Not on file  Tobacco Use  . Smoking status: Current Some Day Smoker     Packs/day: 1.00    Types: Cigarettes  . Smokeless tobacco: Never Used  Substance and Sexual Activity  . Alcohol use: Yes    Comment: only drinks on occasion  . Drug use: Yes    Types: Marijuana, Benzodiazepines  . Sexual activity: Never  Lifestyle  . Physical activity:    Days per week: Not on file    Minutes per session: Not on file  . Stress: Not on file  Relationships  . Social connections:    Talks on phone: Not on file    Gets together: Not on file    Attends religious service: Not on file    Active member of club or organization: Not on file    Attends meetings of clubs or organizations: Not on file    Relationship status: Not on file  Other Topics Concern  . Not on file  Social History Narrative  . Not on file   Hospital Course: (Per Md's SRA): Gaberial Cada is a 37 y/o M with history of schizoaffective disorder who was admitted from ED voluntarily with worsening depression and SI with thoughts to cut himself. Pt was restarted on home medications from his ACT team including bupropion at a higher dose. Pt receives [place] and he was given oral Invega to supplement his depot injection. He reported incremental improvement of his presenting symptoms during his stay.  This is one of several  discharge summaries for this 37 year old AA male with hx of mental illness, chronic. He is known on this unit from previous frequent admissions for mood stabilization treatments. After the this most recent admission assessment, Leone's presenting symptoms were identified. His UDS was positive for THC. The medication regimen for the presenting symptoms discussed & initiated with his consent. He was medicated & discharged on; Wellbutrin XL 450 mg for depression, Gabapentin 300 mg for agitation, Hydroxyzine 25 mg prn for anxiety. paliperidone 6 mg for mood control & Trazodone 50 mg for insomnia. He was enrolled & actively participated in the group counseling sessions being offered & held  on this unit.Marland Kitchen. He was able to learn & verbalize the coping skills that should help him cope better to maintain mood stability after discharge.  During the course of his hospitalization, Hairo's improvement was monitored by observation and his daily report of symptom reduction noted. Evidence was further noted by  presentation of good affect, improved mood & behavior. He presented other pre-existing health issues that required treatments or monitoring. He was resumed on all his pertinent home medications for health issues.He tolerated his treatment regimen without any adverse effects reported.  Kaylem's case was presented during treatment team meeting this morning. The team members feel that Caryn BeeKevin was both mentally & medically stable to be discharged to continue mental health care on an outpatient basis as noted below. He was provided with all the necessary information needed to make this appointment without problems. Upon discharge, Caryn BeeKevin denied any SIHI, AVH, delusional thoughts or paranoia. He was provided with a  [facility] to be taken to his local pharmacy to be filled. [facility] with all personal belongings in no apparent distress. Transportation per city bus. [facility] assisted with bus pass.  Physical Findings: AIMS: Facial and Oral Movements Muscles of Facial Expression: None, normal Lips and Perioral Area: None, normal Jaw: None, normal Tongue: None, normal,Extremity Movements Upper (arms, wrists, hands, fingers): None, normal Lower (legs, knees, ankles, toes): None, normal, Trunk Movements Neck, shoulders, hips: None, normal, Overall Severity Severity of abnormal movements (highest score from questions above): None, normal Incapacitation due to abnormal movements: None, normal Patient's awareness of abnormal movements (rate only patient's report): No Awareness, Dental Status Current problems with teeth and/or dentures?: No Does patient usually wear  dentures?: No  CIWA:    COWS:     Musculoskeletal: Strength & Muscle Tone: within normal limits Gait & Station: normal Patient leans: N/A   Psychiatric Specialty Exam: SEES RA BY MD Physical Exam  Nursing note and vitals reviewed. Constitutional: He is oriented to person, place, and time. He appears well-developed.  HENT:  Head: Normocephalic.  Eyes: Pupils are equal, round, and reactive to light.  Neck: Normal range of motion.  Cardiovascular: Normal rate.  Respiratory: Effort normal.  GI: Soft.  Genitourinary:  Genitourinary Comments: Deferred  Musculoskeletal: Normal range of motion.  Neurological: He is alert and oriented to person, place, and time.  Skin: Skin is warm.    Review of Systems  Constitutional: Negative.   HENT: Negative.   Eyes: Negative.   Respiratory: Negative.   Cardiovascular: Negative.   Gastrointestinal: Negative.   Genitourinary: Negative.   Musculoskeletal: Negative.   Skin: Negative.   Neurological: Negative.   Endo/Heme/Allergies: Negative.   Psychiatric/Behavioral: Positive for substance abuse (Hx of substance abuse (THC)) and suicidal ideas (Stable). Negative for hallucinations and memory loss. Depression: improved  The patient has insomnia (Stable). The patient  is not nervous/anxious (Stable).   All other systems reviewed and are negative.   Blood pressure (!) 119/58, pulse 92, temperature 98.5 F (36.9 C), temperature source Oral, resp. rate 20, height 5\' 11"  (1.803 m), weight 109.3 kg (241 lb), SpO2 100 %.Body mass index is 33.61 kg/m.   Have you used any form of tobacco in the last 30 days? (Cigarettes, Smokeless Tobacco, Cigars, and/or Pipes): Yes  Has this patient used any form of tobacco in the last 30 days? (Cigarettes, Smokeless Tobacco, Cigars, and/or Pipes):Yes, an FDA-approved tobacco cessation medication was offered at discharge.  Blood Alcohol level:  Lab Results  Component Value Date   ETH <10 08/14/2017   ETH <5  12/07/2016   Metabolic Disorder Labs:  Lab Results  Component Value Date   HGBA1C 5.7 (H) 12/11/2016   MPG 117 12/11/2016   MPG 114 01/10/2016   Lab Results  Component Value Date   PROLACTIN 23.9 (H) 12/11/2016   PROLACTIN 15.7 (H) 01/09/2016   Lab Results  Component Value Date   CHOL 184 12/11/2016   TRIG 65 12/11/2016   HDL 59 12/11/2016   CHOLHDL 3.1 12/11/2016   VLDL 13 12/11/2016   LDLCALC 112 (H) 12/11/2016   LDLCALC 102 (H) 01/10/2016   See Psychiatric Specialty Exam and Suicide Risk Assessment completed by Attending Physician prior to discharge.  Discharge destination:  Home  Is patient on multiple antipsychotic therapies at discharge:  No   Has Patient had three or more failed trials of antipsychotic monotherapy by history:  No  Recommended Plan for Multiple Antipsychotic Therapies: NA  Allergies as of 08/20/2017      Reactions   Salami [pickled Meat] Nausea And Vomiting   Allergic to bologna and salami      Medication List    STOP taking these medications   benztropine 0.5 MG tablet Commonly known as:  COGENTIN   citalopram 10 MG tablet Commonly known as:  CELEXA   INVEGA TRINZA IM     TAKE these medications     Indication  albuterol 108 (90 Base) MCG/ACT inhaler Commonly known as:  PROVENTIL HFA;VENTOLIN HFA Inhale 1-2 puffs into the lungs every 6 (six) hours as needed for wheezing or shortness of breath.  Indication:  Asthma   buPROPion HCl ER (XL) 450 MG Tb24 Take 450 mg by mouth daily. For depression  Indication:  Major Depressive Disorder   gabapentin 300 MG capsule Commonly known as:  NEURONTIN Take 1 capsule (300 mg total) by mouth 3 (three) times daily. For agitation What changed:    when to take this  additional instructions  Indication:  Agitation, anxiety   hydrOXYzine 25 MG tablet Commonly known as:  ATARAX/VISTARIL Take 1 tablet (25 mg total) by mouth 3 (three) times daily as needed for anxiety.  Indication:  Feeling  Anxious   paliperidone 6 MG 24 hr tablet Commonly known as:  INVEGA Take 1 tablet (6 mg total) by mouth daily. For mood control What changed:  additional instructions  Indication:  Mood control   pantoprazole 40 MG tablet Commonly known as:  PROTONIX Take 1 tablet (40 mg total) by mouth daily. For acid reflux  Indication:  Gastroesophageal Reflux Disease   traZODone 50 MG tablet Commonly known as:  DESYREL Take 1 tablet (50 mg total) by mouth at bedtime as needed for sleep.  Indication:  Trouble Sleeping      Follow-up [facility] Follow up.   Why:  Someone  from the ACT team will see you the day after d/c Contact information: [address] [place] [address] [phone]          Follow-up recommendations:  Activity:  As tolerated Diet: As recommended by your primary care doctor. Keep all scheduled follow-up appointments as recommended.  Comments:  Patient is instructed prior to discharge to: Take all medications as prescribed by his/her mental healthcare provider. Report any adverse effects and or reactions from the medicines to his/her outpatient provider promptly. Patient has been instructed & cautioned: To not engage in alcohol and or illegal drug use while on prescription medicines. In the event of worsening symptoms, patient is instructed to call the crisis hotline, 911 and or go to the nearest ED for appropriate evaluation and treatment of symptoms. To follow-up with his/her primary care provider for your other medical issues, concerns and or health care needs.  Signed: Armandina Stammer, NP, pmhnp, fnp-BC 08/21/2017, 1:08 PM   Patient seen, Suicide Assessment Completed.  Disposition Plan Reviewed

## 2017-08-20 NOTE — Progress Notes (Signed)
  [facility] Discharge Plan :  Will you be returning to the same living situation after discharge:  Yes,  home At discharge, do you have transportation home?: Yes,  bus pass Do you have the ability to pay for your medications: Yes,  MCD  Release of information consent forms completed and in the chart;  Patient's signature needed at discharge.  Patient to Follow up at: Follow-up Information    [facility] Follow up.   Why:  Someone from the ACT team will see you the day after d/c Contact information: [address] [place] [id number] [phone]           Next level of care provider has access to [facility] Link:no  Safety Planning and Suicide Prevention discussed: Yes,  yes  Have you used any form of tobacco in the last 30 days? (Cigarettes, Smokeless Tobacco, Cigars, and/or Pipes): Yes  Has patient been referred to the Quitline?: Patient refused referral  Patient has been referred for addiction treatment: N/A  Ida RogueRodney B Rayyan Burley, LCSW 08/20/2017, 9:26 AM

## 2017-08-20 NOTE — Progress Notes (Addendum)
Recreation Therapy Notes  Date: 3.20.19 Time: 10:00 am Location: [address] Dayroom   Group Topic: Self Esteem   Goal Area(s) Addresses:  To increase self esteem: Patients will be able to identify five strengths that will improve self-esteem by the end of Recreation Therapy session.  To increase self-awareness: Patients will be able to identify five things that they like about themselves to increase self-awareness by the of Recreation Therapy session.   Behavioral Response: Engaged   Intervention: Craft   Activity: Brochure About Me: Patients created a brochure about themselves listing: best features, proudest moments, strengths, favorite activities, personal note, and things they like about themselves to increase self-esteem   Education: Self-Esteem, Discharge Planning   Education Outcome: Acknowledges education  Clinical Observations/Feedback: Patient attended and participated appropriately during recreation Therapy group treatment, successfully identifying five positive traits about themselves, five activities they enjoy doing, and a motivational message for themselves.    Maretta Overdorf, Recreation Therapy Intern  Bobbie Virden 08/20/2017 11:41 AM 

## 2017-08-20 NOTE — Progress Notes (Signed)
Recreation Therapy Notes  INPATIENT RECREATION TR PLAN  Patient Details Name: Zadyn Yardley MRN: [id number] DOB: May 01, 1981 Today's Date: 08/20/2017  Rec Therapy Plan Is patient appropriate for Therapeutic Recreation?: Yes Treatment times per week: At least three  Estimated Length of Stay: 5-7 days  TR Treatment/Interventions: Group participation (Appropriate participation in Recreation Therapy group tx.)  Discharge Criteria Pt will be discharged from therapy if:: Discharged Treatment plan/goals/alternatives discussed and agreed upon by:: Patient/family  Discharge Summary Short term goals set: See Care Plan  Short term goals met: Complete Progress toward goals comments: Groups attended Which groups?: Self-esteem, Coping skills, Self-Expression  Therapeutic equipment acquired: None  Reason patient discharged from therapy: Discharge from hospital Pt/family agrees with progress & goals achieved: Yes Date patient discharged from therapy: 08/20/17  Ranell Patrick, Recreation Therapy Intern   Ranell Patrick 08/20/2017, 12:29 PM

## 2017-08-20 NOTE — Plan of Care (Signed)
Patient attended and participated appropriately during Recreation Therapy group treatment successfully identifying three positive traits within 5 RT group sessions

## 2017-10-22 ENCOUNTER — Other Ambulatory Visit: Payer: Self-pay

## 2017-10-22 DIAGNOSIS — Z79899 Other long term (current) drug therapy: Secondary | ICD-10-CM | POA: Insufficient documentation

## 2017-10-22 DIAGNOSIS — J45909 Unspecified asthma, uncomplicated: Secondary | ICD-10-CM | POA: Diagnosis not present

## 2017-10-22 DIAGNOSIS — F250 Schizoaffective disorder, bipolar type: Secondary | ICD-10-CM | POA: Diagnosis not present

## 2017-10-22 DIAGNOSIS — F17210 Nicotine dependence, cigarettes, uncomplicated: Secondary | ICD-10-CM | POA: Diagnosis not present

## 2017-10-22 DIAGNOSIS — M79672 Pain in left foot: Secondary | ICD-10-CM | POA: Diagnosis present

## 2017-10-22 DIAGNOSIS — Z811 Family history of alcohol abuse and dependence: Secondary | ICD-10-CM | POA: Diagnosis not present

## 2017-10-22 DIAGNOSIS — L84 Corns and callosities: Secondary | ICD-10-CM | POA: Diagnosis not present

## 2017-10-22 DIAGNOSIS — F319 Bipolar disorder, unspecified: Secondary | ICD-10-CM | POA: Diagnosis not present

## 2017-10-22 DIAGNOSIS — G259 Extrapyramidal and movement disorder, unspecified: Secondary | ICD-10-CM | POA: Diagnosis not present

## 2017-10-22 DIAGNOSIS — F1220 Cannabis dependence, uncomplicated: Secondary | ICD-10-CM | POA: Insufficient documentation

## 2017-10-22 DIAGNOSIS — F209 Schizophrenia, unspecified: Secondary | ICD-10-CM | POA: Diagnosis not present

## 2017-10-23 ENCOUNTER — Emergency Department (HOSPITAL_COMMUNITY)
Admission: EM | Admit: 2017-10-23 | Discharge: 2017-10-23 | Disposition: A | Discharge status: Home / Self Care | Payer: Medicaid Other | Attending: Emergency Medicine | Admitting: Emergency Medicine

## 2017-10-23 ENCOUNTER — Encounter (HOSPITAL_COMMUNITY): Payer: Self-pay | Admitting: *Deleted

## 2017-10-23 ENCOUNTER — Encounter (HOSPITAL_COMMUNITY): Payer: Self-pay | Admitting: Emergency Medicine

## 2017-10-23 ENCOUNTER — Emergency Department (EMERGENCY_DEPARTMENT_HOSPITAL)
Admission: EM | Admit: 2017-10-23 | Discharge: 2017-10-24 | Disposition: A | Discharge status: Home / Self Care | Payer: Medicaid Other | Source: Home / Self Care | Attending: Emergency Medicine | Admitting: Emergency Medicine

## 2017-10-23 ENCOUNTER — Other Ambulatory Visit: Payer: Self-pay

## 2017-10-23 DIAGNOSIS — L84 Corns and callosities: Secondary | ICD-10-CM

## 2017-10-23 DIAGNOSIS — F250 Schizoaffective disorder, bipolar type: Secondary | ICD-10-CM | POA: Diagnosis present

## 2017-10-23 DIAGNOSIS — R45851 Suicidal ideations: Secondary | ICD-10-CM

## 2017-10-23 LAB — CBC
HCT: 38.2 % — ABNORMAL LOW (ref 39.0–52.0)
HEMOGLOBIN: 13 g/dL (ref 13.0–17.0)
MCH: 27.7 pg (ref 26.0–34.0)
MCHC: 34 g/dL (ref 30.0–36.0)
MCV: 81.3 fL (ref 78.0–100.0)
Platelets: 236 10*3/uL (ref 150–400)
RBC: 4.7 MIL/uL (ref 4.22–5.81)
RDW: 14.6 % (ref 11.5–15.5)
WBC: 5.4 10*3/uL (ref 4.0–10.5)

## 2017-10-23 LAB — COMPREHENSIVE METABOLIC PANEL
ALK PHOS: 65 U/L (ref 38–126)
ALT: 35 U/L (ref 17–63)
ANION GAP: 11 (ref 5–15)
AST: 37 U/L (ref 15–41)
Albumin: 4.2 g/dL (ref 3.5–5.0)
BUN: 16 mg/dL (ref 6–20)
CALCIUM: 9.1 mg/dL (ref 8.9–10.3)
CHLORIDE: 105 mmol/L (ref 101–111)
CO2: 22 mmol/L (ref 22–32)
Creatinine, Ser: 1.09 mg/dL (ref 0.61–1.24)
GFR calc Af Amer: >60 mL/min (ref >60)
GFR calc non Af Amer: >60 mL/min (ref >60)
Glucose, Bld: 91 mg/dL (ref 65–99)
Potassium: 3.7 mmol/L (ref 3.5–5.1)
SODIUM: 138 mmol/L (ref 135–145)
Total Bilirubin: 0.9 mg/dL (ref 0.3–1.2)
Total Protein: 7.5 g/dL (ref 6.5–8.1)

## 2017-10-23 LAB — RAPID URINE DRUG SCREEN, HOSP PERFORMED
Amphetamines: NOT DETECTED
Barbiturates: NOT DETECTED
Benzodiazepines: NOT DETECTED
Cocaine: NOT DETECTED
Opiates: NOT DETECTED
Tetrahydrocannabinol: POSITIVE — AB

## 2017-10-23 LAB — ACETAMINOPHEN LEVEL: Acetaminophen (Tylenol), Serum: <10 ug/mL — ABNORMAL LOW (ref 10–30)

## 2017-10-23 LAB — ETHANOL: Alcohol, Ethyl (B): <10 mg/dL (ref <10)

## 2017-10-23 LAB — SALICYLATE LEVEL: Salicylate Lvl: <7 mg/dL (ref 2.8–30.0)

## 2017-10-23 MED ORDER — HYDROXYZINE HCL 25 MG PO TABS: 25.0000 mg | ORAL_TABLET | Freq: Three times a day (TID) | ORAL | Status: DC | PRN

## 2017-10-23 MED ORDER — GABAPENTIN 300 MG PO CAPS
300.0000 mg | ORAL_CAPSULE | Freq: Three times a day (TID) | ORAL | Status: DC
  Administered 2017-10-23 – 2017-10-24 (×2): 300 mg via ORAL
  Filled 2017-10-23 (×2): qty 1

## 2017-10-23 MED ORDER — PANTOPRAZOLE SODIUM 40 MG PO TBEC
40.0000 mg | DELAYED_RELEASE_TABLET | Freq: Every day | ORAL | Status: DC
  Administered 2017-10-23 – 2017-10-24 (×2): 40 mg via ORAL
  Filled 2017-10-23 (×2): qty 1

## 2017-10-23 MED ORDER — ALUM & MAG HYDROXIDE-SIMETH 200-200-20 MG/5ML PO SUSP: 30.0000 mL | Freq: Four times a day (QID) | ORAL | Status: DC | PRN

## 2017-10-23 MED ORDER — BENZTROPINE MESYLATE 0.5 MG PO TABS
0.5000 mg | ORAL_TABLET | Freq: Two times a day (BID) | ORAL | Status: DC
  Administered 2017-10-23 – 2017-10-24 (×2): 0.5 mg via ORAL
  Filled 2017-10-23 (×2): qty 1

## 2017-10-23 MED ORDER — ACETAMINOPHEN 325 MG PO TABS: 650.0000 mg | ORAL_TABLET | ORAL | Status: DC | PRN

## 2017-10-23 MED ORDER — BUPROPION HCL ER (XL) 150 MG PO TB24
450.0000 mg | ORAL_TABLET | Freq: Every day | ORAL | Status: DC
  Administered 2017-10-24: 450 mg via ORAL
  Filled 2017-10-23: qty 3

## 2017-10-23 MED ORDER — PALIPERIDONE ER 6 MG PO TB24
6.0000 mg | ORAL_TABLET | Freq: Every day | ORAL | Status: DC
  Administered 2017-10-24: 6 mg via ORAL
  Filled 2017-10-23: qty 1

## 2017-10-23 MED ORDER — TRAZODONE HCL 50 MG PO TABS: 50.0000 mg | ORAL_TABLET | Freq: Every evening | ORAL | Status: DC | PRN

## 2017-10-23 MED ORDER — ONDANSETRON HCL 4 MG PO TABS: 4.0000 mg | ORAL_TABLET | Freq: Three times a day (TID) | ORAL | Status: DC | PRN

## 2017-10-23 MED ORDER — IBUPROFEN 400 MG PO TABS
600.0000 mg | ORAL_TABLET | Freq: Once | ORAL | Status: AC
  Administered 2017-10-23: 08:00:00 600 mg via ORAL
  Filled 2017-10-23: qty 1

## 2017-10-23 NOTE — ED Notes (Addendum)
Clothing and Valuables, 1 clothing bag, 1 Military backpack, 1 cracked cell phone/ANS phone. Misc. Clothing., Pt wanded by [employer].

## 2017-10-23 NOTE — ED Triage Notes (Signed)
Pt c/o bilateral feet pain x 3 weeks. Calluses noted to both feet; reports walking frequently

## 2017-10-23 NOTE — ED Provider Notes (Signed)
[facility] EMERGENCY DEPARTMENT Provider Note   CSN: [id number] Arrival date & time: 10/22/17  2353     History   Chief Complaint Chief Complaint  Patient presents with  . Foot Pain    HPI Ronald Martinez is a 37 y.o. male.  HPI  37 year old male presents with bilateral foot pain.  He states it has been ongoing for at least 2 months.  The pain is in the bottom of his foot where he has some swelling and calluses.  He has not noticed any foot swelling or skin changes such as redness.  He has not been taking anything for the pain.  He does walk a fair bit and it hurts to walk but he is still able to walk.  Past Medical History:  Diagnosis Date  . Asthma   . Bipolar 1 disorder (HCC)   . Depression   . Schizophrenia [facility])     Patient Active Problem List   Diagnosis Date Noted  . 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 Cannabis use disorder, severe, dependence (HCC) 08/17/2014  . Schizoaffective disorder, bipolar type (HCC) 08/16/2014    History reviewed. No pertinent surgical history.      Home Medications    Prior to Admission medications   Medication Sig Start Date End Date Taking? Authorizing Provider  albuterol (PROVENTIL HFA;VENTOLIN HFA) 108 (90 Base) MCG/ACT inhaler Inhale 1-2 puffs into the lungs every 6 (six) hours as needed for wheezing or shortness of breath. 08/20/17   Armandina Stammer I, NP  buPROPion 450 MG TB24 Take 450 mg by mouth daily. For depression 08/21/17   Armandina Stammer I, NP  gabapentin (NEURONTIN) 300 MG capsule Take 1 capsule (300 mg total) by mouth 3 (three) times daily. For agitation 08/20/17   Armandina Stammer I, NP  hydrOXYzine (ATARAX/VISTARIL) 25 MG tablet Take 1 tablet (25 mg total) by mouth 3 (three) times daily as needed for anxiety. 08/20/17   Armandina Stammer I, NP  paliperidone (INVEGA) 6 MG 24 hr tablet Take 1 tablet (6 mg total) by mouth daily. For mood control 08/21/17   Armandina Stammer I, NP  pantoprazole (PROTONIX) 40  MG tablet Take 1 tablet (40 mg total) by mouth daily. For acid reflux 08/21/17   Armandina Stammer I, NP  traZODone (DESYREL) 50 MG tablet Take 1 tablet (50 mg total) by mouth at bedtime as needed for sleep. 08/20/17   Sanjuana Kava, NP    Family History Family History  Problem Relation Age of Onset  . Asthma Mother   . Alcoholism Other     Social History Social History   Tobacco Use  . Smoking status: Current Some Day Smoker    Packs/day: 1.00    Types: Cigarettes  . Smokeless tobacco: Never Used  Substance Use Topics  . Alcohol use: Yes    Comment: only drinks on occasion  . Drug use: Yes    Types: Marijuana, Benzodiazepines     Allergies   Salami [pickled meat]   Review of Systems Review of Systems  Musculoskeletal: Positive for arthralgias. Negative for joint swelling.  Skin: Negative for color change and wound.     Physical Exam Updated Vital Signs BP 110/70 (BP Location: Right Arm)   Pulse 71   Temp 98.2 F (36.8 C) (Oral)   Resp 16   SpO2 100%   Physical Exam  Constitutional: He is oriented to person, place, and time. He appears well-developed and well-nourished.  HENT:  Head: Normocephalic  and atraumatic.  Eyes: Right eye exhibits no discharge. Left eye exhibits no discharge.  Cardiovascular: Normal rate and regular rhythm.  Pulses:      Dorsalis pedis pulses are 2+ on the right side, and 2+ on the left side.  Pulmonary/Chest: Effort normal.  Musculoskeletal: He exhibits no edema.       Left foot: There is tenderness.       Feet:  Feet:  Right Foot:  Skin Integrity: Positive for callus.  Left Foot:  Skin Integrity: Positive for callus.  Neurological: He is alert and oriented to person, place, and time.  Skin: Skin is warm and dry. No erythema.  Nursing note and vitals reviewed.    ED Treatments / Results  Labs (all labs ordered are listed, but only abnormal results are displayed) Labs Reviewed - No data to  display  EKG None  Radiology No results found.  Procedures Procedures (including critical care time)  Medications Ordered in ED Medications  ibuprofen (ADVIL,MOTRIN) tablet 600 mg (has no administration in time range)     Initial Impression / Assessment and Plan / ED Course  I have reviewed the triage vital signs and the nursing notes.  Pertinent labs & imaging results that were available during my care of the patient were reviewed by me and considered in my medical decision making (see chart for details).     Exam consistent with calluses.  No signs of an infection or abscess.  There is no otherwise focal swelling and he is neurovascular intact in his feet.  Was given ibuprofen here, will give podiatry referral.  Otherwise no acute complaints and appears stable for discharge home.  Final Clinical Impressions(s) / ED Diagnoses   Final diagnoses:  Foot callus    ED Discharge Orders    None       Pricilla Loveless, MD 10/23/17 [phone]

## 2017-10-23 NOTE — ED Notes (Signed)
Pt presents with SI and depression.  Positive AVH noted also.  SI with plan to slash wrists.  Complaint of difficulty sleeping also, only 4 hours per night.  A&O x 3, no distress noted, calm & cooperative.  Denies feeling hopeless.  Monitoring for safety.
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
## 2017-10-23 NOTE — ED Provider Notes (Signed)
[facility]-EMERGENCY DEPT Provider Note   CSN: [id number] Arrival date & time: 10/23/17  2055     History   Chief Complaint No chief complaint on file.   HPI Ronald Martinez is a 37 y.o. male who presents the emergency department for depression and suicidal ideation.  Patient was seen earlier this evening at [facility] for his foot pain diagnosed with calluses.  He states that he came over here because this is where he will would come for psychiatric complaints.  The patient states that for about a week and a half he has had worsening depression and thoughts of suicide.  He is currently on medications and followed by outpatient psychiatrist.  Patient states that he has been taking his medications appropriately and does not use drugs or alcohol however has had worsening thoughts of harming himself and has thought about overdosing on his medications.  He states that he normally hears voices but now he is also having visual hallucinations.  The patient has had decreased ability to sleep and states that he is only been getting about 4 hours a night.  He denies homicidal ideation.  HPI  Past Medical History:  Diagnosis Date  . Asthma   . Bipolar 1 disorder (HCC)   . Depression   . Schizophrenia [facility])     Patient Active Problem List   Diagnosis Date Noted  . 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 Cannabis use disorder, severe, dependence (HCC) 08/17/2014  . Schizoaffective disorder, bipolar type (HCC) 08/16/2014    No past surgical history on file.      Home Medications    Prior to Admission medications   Medication Sig Start Date End Date Taking? Authorizing Provider  albuterol (PROVENTIL HFA;VENTOLIN HFA) 108 (90 Base) MCG/ACT inhaler Inhale 1-2 puffs into the lungs every 6 (six) hours as needed for wheezing or shortness of breath. 08/20/17   Armandina Stammer I, NP  buPROPion 450 MG TB24 Take 450 mg by mouth daily. For depression 08/21/17    Armandina Stammer I, NP  gabapentin (NEURONTIN) 300 MG capsule Take 1 capsule (300 mg total) by mouth 3 (three) times daily. For agitation 08/20/17   Armandina Stammer I, NP  hydrOXYzine (ATARAX/VISTARIL) 25 MG tablet Take 1 tablet (25 mg total) by mouth 3 (three) times daily as needed for anxiety. 08/20/17   Armandina Stammer I, NP  paliperidone (INVEGA) 6 MG 24 hr tablet Take 1 tablet (6 mg total) by mouth daily. For mood control 08/21/17   Armandina Stammer I, NP  pantoprazole (PROTONIX) 40 MG tablet Take 1 tablet (40 mg total) by mouth daily. For acid reflux 08/21/17   Armandina Stammer I, NP  traZODone (DESYREL) 50 MG tablet Take 1 tablet (50 mg total) by mouth at bedtime as needed for sleep. 08/20/17   Sanjuana Kava, NP    Family History Family History  Problem Relation Age of Onset  . Asthma Mother   . Alcoholism Other     Social History Social History   Tobacco Use  . Smoking status: Current Some Day Smoker    Packs/day: 1.00    Types: Cigarettes  . Smokeless tobacco: Never Used  Substance Use Topics  . Alcohol use: Yes    Comment: only drinks on occasion  . Drug use: Yes    Types: Marijuana, Benzodiazepines     Allergies   Salami [pickled meat]   Review of Systems Review of Systems  Ten systems reviewed and are negative for  acute change, except as noted in the HPI.   Physical Exam Updated Vital Signs There were no vitals taken for this visit.  Physical Exam  Constitutional: He appears well-developed and well-nourished. No distress.  HENT:  Head: Normocephalic and atraumatic.  Eyes: Pupils are equal, round, and reactive to light. Conjunctivae and EOM are normal. No scleral icterus.  Neck: Normal range of motion. Neck supple.  Cardiovascular: Normal rate, regular rhythm and normal heart sounds.  Pulmonary/Chest: Effort normal and breath sounds normal. No respiratory distress.  Abdominal: Soft. There is no tenderness.  Musculoskeletal: He exhibits no edema.  Neurological: He is alert.    Skin: Skin is warm and dry. He is not diaphoretic.  Psychiatric: He is slowed.  Nursing note and vitals reviewed.    ED Treatments / Results  Labs (all labs ordered are listed, but only abnormal results are displayed) Labs Reviewed - No data to display  EKG None  Radiology No results found.  Procedures Procedures (including critical care time)  Medications Ordered in ED Medications - No data to display   Initial Impression / Assessment and Plan / ED Course  I have reviewed the triage vital signs and the nursing notes.  Pertinent labs & imaging results that were available during my care of the patient were reviewed by me and considered in my medical decision making (see chart for details).     Patient medically clear. Seen by TTS and will be observed overnight with AM psych eval.  Final Clinical Impressions(s) / ED Diagnoses   Final diagnoses:  Suicidal ideation    ED Discharge Orders    None       Arthor Captain, PA-C 10/23/17 2227    Gwyneth Sprout, MD 10/27/17 2325

## 2017-10-23 NOTE — Progress Notes (Signed)
TTS consulted with Nira Conn, NP who recommends continued observation for safety and stabilization and to be reassessed in the AM by psych. EDP Arthor Captain, PA-C and pt's nurse Ronnell Freshwater, RN have been advised of the disposition.  Princess Bruins, MSW, LCSW Therapeutic Triage Specialist  [phone]

## 2017-10-23 NOTE — ED Triage Notes (Addendum)
Presents with complaint of SI and depression, meds not working. Plan to slash wrists, last attempt 10 yrs ago.

## 2017-10-23 NOTE — ED Notes (Signed)
Pt stated he was no longer and left

## 2017-10-24 DIAGNOSIS — G259 Extrapyramidal and movement disorder, unspecified: Secondary | ICD-10-CM

## 2017-10-24 DIAGNOSIS — Z811 Family history of alcohol abuse and dependence: Secondary | ICD-10-CM

## 2017-10-24 DIAGNOSIS — G4700 Insomnia, unspecified: Secondary | ICD-10-CM | POA: Diagnosis not present

## 2017-10-24 DIAGNOSIS — F250 Schizoaffective disorder, bipolar type: Secondary | ICD-10-CM

## 2017-10-24 DIAGNOSIS — Z79899 Other long term (current) drug therapy: Secondary | ICD-10-CM | POA: Diagnosis not present

## 2017-10-24 DIAGNOSIS — F419 Anxiety disorder, unspecified: Secondary | ICD-10-CM | POA: Diagnosis not present

## 2017-10-24 DIAGNOSIS — F17210 Nicotine dependence, cigarettes, uncomplicated: Secondary | ICD-10-CM | POA: Diagnosis not present

## 2017-10-24 NOTE — ED Notes (Signed)
Pt presents with SI with a plan to slash wrists and depression.  Patient reports AVH.Marland Kitchen Plan of care discussed. Encouragement and support provided and safety maintain. Q 15 min safety checks in place and video monitoring.

## 2017-10-24 NOTE — BH Assessment (Signed)
[facility] Assessment Progress Note  Per Juanetta Beets, DO, this pt does not require psychiatric hospitalization at this time.  Pt is to be discharged from [facility] with recommendation to continue treatment with the [facility] ACT Team.  This has been included in pt's discharge instructions.  At 10:09 this writer called Charlott Rakes at [facility], notifying him that pt is to be discharged by 14:00, and asking that [facility] staff pick pt up by that time.  Ree Kida agrees to discuss this with ACT Team staff.  I provided him with my call back number.  Return call is pending as of this writing.  Pt's nurse, Kendal Hymen, has been notified.  Doylene Canning, MA Triage Specialist [phone]

## 2017-10-24 NOTE — Discharge Instructions (Signed)
For your behavioral health needs, you are advised to continue treatment the [facility] ACT Team:       [facility] ACT Team      [facility], [address]      [address]      [place], [place]  [address]      [phone]      Crisis number: [phone]

## 2017-10-24 NOTE — Consult Note (Addendum)
[facility] Consult   Reason for Consult:  Suicidal ideations with hallucinations Referring Physician:  EDP Patient Identification: Ronald Martinez MRN:  [id number] Principal Diagnosis: Schizoaffective disorder, bipolar type [facility]) Diagnosis:   Patient Active Problem List   Diagnosis Date Noted  . Schizoaffective disorder, bipolar type ([place]) [F25.0] 08/16/2014    Priority: High  . Benzodiazepine abuse in remission ([place]) [F13.11] 01/09/2016  . Cannabis use disorder, severe, dependence ([place]) [F12.20] 08/17/2014    Total Time spent with patient: 45 minutes  Subjective:   Ronald Martinez is a 37 y.o. male patient does not warrant admission.  HPI:  37 yo male who presented to the ED with hallucinations and suicidal ideations.  He is well known to the ED and providers for similar presentations.  He has an ACT team, [facility], that follows him but he did not contact them.  On assessment, he is not responding to internal stimuli or having suicidal/homicidal ideations or substance abuse issues.  [facility] ACT team called to come and get the patient to continue his care.  Past Psychiatric History: schizoaffective disorder  Risk to Self: None Risk to Others: Homicidal Ideation: No Thoughts of Harm to Others: No Current Homicidal Intent: No Current Homicidal Plan: No Access to Homicidal Means: No History of harm to others?: No Assessment of Violence: None Noted Does patient have access to weapons?: No Criminal Charges Pending?: No Does patient have a court date: No Prior Inpatient Therapy: Prior Inpatient Therapy: Yes Prior Therapy Dates: 2019, 2018, 2017, 2017 Prior Therapy Facilty/Provider(s): [facility] Reason for Treatment: SCHIZOAFFECTIVE  Prior Outpatient Therapy: Prior Outpatient Therapy: Yes Prior Therapy Dates: CURRENT Prior Therapy Facilty/Provider(s): PT [facility] "[facility]" Reason for Treatment: MED MANAGEMENT  Does patient have an ACCT team?: No Does patient have Intensive In-House  Services?  : No Does patient have [facility] services? : No Does patient have P4CC services?: No  Past Medical History:  Past Medical History:  Diagnosis Date  . Asthma   . Bipolar 1 disorder ([place])   . Depression   . Schizophrenia ([place])    History reviewed. No pertinent surgical history. Family History:  Family History  Problem Relation Age of Onset  . Asthma Mother   . Alcoholism Other    Family Psychiatric  History: see above Social History:  Social History   Substance and Sexual Activity  Alcohol Use Yes   Comment: only drinks on occasion     Social History   Substance and Sexual Activity  Drug Use Yes  . Types: Marijuana, Benzodiazepines    Social History   Socioeconomic History  . Marital status: Single    Spouse name: Not on file  . Number of children: Not on file  . Years of education: Not on file  . Highest education level: Not on file  Occupational History  . Not on file  Social Needs  . Financial resource strain: Not on file  . Food insecurity:    Worry: Not on file    Inability: Not on file  . Transportation needs:    Medical: Not on file    Non-medical: Not on file  Tobacco Use  . Smoking status: Current Some Day Smoker    Packs/day: 1.00    Types: Cigarettes  . Smokeless tobacco: Never Used  Substance and Sexual Activity  . Alcohol use: Yes    Comment: only drinks on occasion  . Drug use: Yes    Types: Marijuana, Benzodiazepines  . Sexual activity: Never  Lifestyle  .  Physical activity:    Days per week: Not on file    Minutes per session: Not on file  . Stress: Not on file  Relationships  . Social connections:    Talks on phone: Not on file    Gets together: Not on file    Attends religious service: Not on file    Active member of club or organization: Not on file    Attends meetings of clubs or organizations: Not on file    Relationship status: Not on file  Other Topics Concern  . Not on file  Social History Narrative  . Not on  file   Additional Social History: N/A    Allergies:   Allergies  Allergen Reactions  . [place] Brownie Meat] Nausea And Vomiting    Allergic to bologna and salami    Labs:  Results for orders placed or performed during the hospital encounter of 10/23/17 (from the past 48 hour(s))  Rapid urine drug screen (hospital performed)     Status: Abnormal   Collection Time: 10/23/17  9:34 PM  Result Value Ref Range   Opiates NONE DETECTED NONE DETECTED   Cocaine NONE DETECTED NONE DETECTED   Benzodiazepines NONE DETECTED NONE DETECTED   Amphetamines NONE DETECTED NONE DETECTED   Tetrahydrocannabinol POSITIVE (A) NONE DETECTED   Barbiturates NONE DETECTED NONE DETECTED    Comment: (NOTE) DRUG SCREEN FOR MEDICAL PURPOSES ONLY.  IF CONFIRMATION IS NEEDED FOR ANY PURPOSE, NOTIFY LAB WITHIN 5 DAYS. LOWEST DETECTABLE LIMITS FOR URINE DRUG SCREEN Drug Class                     Cutoff (ng/mL) Amphetamine and metabolites    1000 Barbiturate and metabolites    200 Benzodiazepine                 267 Tricyclics and metabolites     300 Opiates and metabolites        300 Cocaine and metabolites        300 THC                            50 Performed at [facility], [place] [address], [place], [place] [address]   Comprehensive metabolic panel     Status: None   Collection Time: 10/23/17  9:45 PM  Result Value Ref Range   Sodium 138 135 - 145 mmol/L   Potassium 3.7 3.5 - 5.1 mmol/L   Chloride 105 101 - 111 mmol/L   CO2 22 22 - 32 mmol/L   Glucose, Bld 91 65 - 99 mg/dL   BUN 16 6 - 20 mg/dL   Creatinine, Ser 1.09 0.61 - 1.24 mg/dL   Calcium 9.1 8.9 - 10.3 mg/dL   Total Protein 7.5 6.5 - 8.1 g/dL   Albumin 4.2 3.5 - 5.0 g/dL   AST 37 15 - 41 U/L   ALT 35 17 - 63 U/L   Alkaline Phosphatase 65 38 - 126 U/L   Total Bilirubin 0.9 0.3 - 1.2 mg/dL   GFR calc non Af Amer >60 >60 mL/min   GFR calc Af Amer >60 >60 mL/min    Comment: (NOTE) The eGFR has been calculated using  the CKD EPI equation. This calculation has not been validated in all clinical situations. eGFR's persistently <60 mL/min signify possible Chronic Kidney Disease.    Anion gap 11 5 - 15    Comment: Performed at  [facility], [place] [address], [place], [place] [address]  cbc     Status: Abnormal   Collection Time: 10/23/17  9:45 PM  Result Value Ref Range   WBC 5.4 4.0 - 10.5 K/uL   RBC 4.70 4.22 - 5.81 MIL/uL   Hemoglobin 13.0 13.0 - 17.0 g/dL   HCT 38.2 (L) 39.0 - 52.0 %   MCV 81.3 78.0 - 100.0 fL   MCH 27.7 26.0 - 34.0 pg   MCHC 34.0 30.0 - 36.0 g/dL   RDW 14.6 11.5 - 15.5 %   Platelets 236 150 - 400 K/uL    Comment: Performed at [facility], [place] [address] [place], [place] [address]  Ethanol     Status: None   Collection Time: 10/23/17  9:46 PM  Result Value Ref Range   Alcohol, Ethyl (B) <10 <10 mg/dL    Comment: (NOTE) Lowest detectable limit for serum alcohol is 10 mg/dL. For medical purposes only. Performed at [facility], [place] [address], [place], [place] [address]   Salicylate level     Status: None   Collection Time: 10/23/17  9:46 PM  Result Value Ref Range   Salicylate Lvl <1.8 2.8 - 30.0 mg/dL    Comment: Performed at [facility], [address], [place], [place] [address]  Acetaminophen level     Status: Abnormal   Collection Time: 10/23/17  9:46 PM  Result Value Ref Range   Acetaminophen (Tylenol), Serum <10 (L) 10 - 30 ug/mL    Comment: (NOTE) Therapeutic concentrations vary significantly. A range of 10-30 ug/mL  may be an effective concentration for many patients. However, some  are best treated at concentrations outside of this range. Acetaminophen concentrations >150 ug/mL at 4 hours after ingestion  and >50 ug/mL at 12 hours after ingestion are often associated with  toxic reactions. Performed at [facility], [place] [address] [place], [place] [address]      Current Facility-Administered Medications  Medication Dose Route Frequency Provider Last Rate Last Dose  . acetaminophen (TYLENOL) tablet 650 mg  650 mg Oral Q4H PRN Margarita Mail, PA-C      . alum & mag hydroxide-simeth (MAALOX/MYLANTA) 200-200-20 MG/5ML suspension 30 mL  30 mL Oral Q6H PRN Margarita Mail, PA-C      . benztropine (COGENTIN) tablet 0.5 mg  0.5 mg Oral BID Margarita Mail, PA-C   0.5 mg at 10/23/17 2239  . buPROPion (WELLBUTRIN XL) 24 hr tablet 450 mg  450 mg Oral Daily Harris, Abigail, PA-C      . gabapentin (NEURONTIN) capsule 300 mg  300 mg Oral TID Margarita Mail, PA-C   300 mg at 10/23/17 2239  . hydrOXYzine (ATARAX/VISTARIL) tablet 25 mg  25 mg Oral TID PRN Margarita Mail, PA-C      . ondansetron (ZOFRAN) tablet 4 mg  4 mg Oral Q8H PRN Harris, Abigail, PA-C      . paliperidone (INVEGA) 24 hr tablet 6 mg  6 mg Oral Daily Harris, Abigail, PA-C      . pantoprazole (PROTONIX) EC tablet 40 mg  40 mg Oral Daily Margarita Mail, PA-C   40 mg at 10/23/17 2239  . traZODone (DESYREL) tablet 50 mg  50 mg Oral QHS PRN Margarita Mail, PA-C       Current Outpatient Medications  Medication Sig Dispense Refill  . albuterol (PROVENTIL HFA;VENTOLIN HFA) 108 (90 Base) MCG/ACT inhaler Inhale 1-2 puffs into the lungs every 6 (six)  hours as needed for wheezing or shortness of breath. 1 Inhaler 0  . benztropine (COGENTIN) 0.5 MG tablet Take 0.5 mg by mouth 2 (two) times daily.    Marland Kitchen buPROPion 450 MG TB24 Take 450 mg by mouth daily. For depression 30 tablet 0  . gabapentin (NEURONTIN) 300 MG capsule Take 1 capsule (300 mg total) by mouth 3 (three) times daily. For agitation 90 capsule 0  . hydrOXYzine (ATARAX/VISTARIL) 25 MG tablet Take 1 tablet (25 mg total) by mouth 3 (three) times daily as needed for anxiety. 60 tablet 0  . paliperidone (INVEGA) 6 MG 24 hr tablet Take 1 tablet (6 mg total) by mouth daily. For mood control 30 tablet 0  . pantoprazole (PROTONIX) 40 MG tablet Take 1  tablet (40 mg total) by mouth daily. For acid reflux 15 tablet 0  . traZODone (DESYREL) 50 MG tablet Take 1 tablet (50 mg total) by mouth at bedtime as needed for sleep. 30 tablet 0    Musculoskeletal: Strength & Muscle Tone: within normal limits Gait & Station: normal Patient leans: N/A  Psychiatric Specialty Exam: Physical Exam  Nursing note and vitals reviewed. Constitutional: He is oriented to person, place, and time. He appears well-developed and well-nourished.  HENT:  Head: Normocephalic.  Neck: Normal range of motion.  Respiratory: Effort normal.  Musculoskeletal: Normal range of motion.  Neurological: He is alert and oriented to person, place, and time.  Psychiatric: His speech is normal and behavior is normal. Judgment and thought content normal. His mood appears anxious. Cognition and memory are normal.    Review of Systems  Psychiatric/Behavioral: The patient is nervous/anxious.   All other systems reviewed and are negative.   Blood pressure 120/81, pulse 91, temperature 97.9 F (36.6 C), temperature source Oral, resp. rate 18, height _0  (1.803 m), weight 108.9 kg (240 lb), SpO2 98 %.Body mass index is 33.47 kg/m.  General Appearance: Casual  Eye Contact:  Good  Speech:  Normal Rate  Volume:  Normal  Mood:  Anxious, mild  Affect:  Congruent  Thought Process:  Coherent and Descriptions of Associations: Intact  Orientation:  Full (Time, Place, and Person)  Thought Content:  WDL and Logical  Suicidal Thoughts:  No  Homicidal Thoughts:  No  Memory:  Immediate;   Good Recent;   Good Remote;   Good  Judgement:  Fair  Insight:  Fair  Psychomotor Activity:  Normal  Concentration:  Concentration: Good and Attention Span: Good  Recall:  Good  Fund of Knowledge:  Fair  Language:  Good  Akathisia:  No  Handed:  Right  AIMS (if indicated):   N/A  Assets:  Housing Leisure Time Physical Health Resilience Social Support  ADL's:  Intact  Cognition:  WNL   Sleep:   N/A    Treatment Plan Summary: Schizoaffective disorder, bipolar type: -Restarted Wellbutrin 450 mg daily for depression -Restarted Invega 6 mg daily for psychosis  EPS: -Restarted Cogentin 0.5 mg BID for EPS  Anxiety: -Restarted Gabapentin 300 mg TID for anxiety -Started Hydroxyzine 25 mg TID PRN anxiety  Insomnia: -Started Trazodone 50 mg at bedtime PRN sleep  Disposition: No evidence of imminent risk to self or others at present.    Waylan Boga, NP 10/24/2017 10:15 AM   Patient seen face-to-face for psychiatric evaluation, chart reviewed and case discussed with the physician extender and developed treatment plan. Reviewed the information documented and agree with the treatment plan.  Buford Dresser, DO 10/27/17 3:11 PM

## 2017-10-25 NOTE — [facility] Suicide Risk Assessment (Signed)
Suicide Risk Assessment  Discharge Assessment   [facility] Discharge Suicide Risk Assessment   Principal Problem: Schizoaffective disorder, bipolar type [facility]) Discharge Diagnoses:  Patient Active Problem List   Diagnosis Date Noted  . Schizoaffective disorder, bipolar type ([facility]) [F25.0] 08/16/2014    Priority: High  . 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 Cannabis use disorder, severe, dependence ([facility]) [F12.20] 08/17/2014    Total Time spent with patient: 45 minutes   M Musculoskeletal: Strength & Muscle Tone: within normal limits Gait & Station: normal Patient leans: N/A  Psychiatric Specialty Exam: Physical Exam  Nursing note and vitals reviewed. Constitutional: He is oriented to person, place, and time. He appears well-developed and well-nourished.  HENT:  Head: Normocephalic.  Neck: Normal range of motion.  Respiratory: Effort normal.  Musculoskeletal: Normal range of motion.  Neurological: He is alert and oriented to person, place, and time.  Psychiatric: His speech is normal and behavior is normal. Judgment and thought content normal. His mood appears anxious. Cognition and memory are normal.    Review of Systems  Psychiatric/Behavioral: The patient is nervous/anxious.   All other systems reviewed and are negative.   Blood pressure 120/81, pulse 91, temperature 97.9 F (36.6 C), temperature source Oral, resp. rate 18, height  (1.803 m), weight 108.9 kg (240 lb), SpO2 98 %.Body mass index is 33.47 kg/m.  General Appearance: Casual  Eye Contact:  Good  Speech:  Normal Rate  Volume:  Normal  Mood:  Anxious, mild  Affect:  Congruent  Thought Process:  Coherent and Descriptions of Associations: Intact  Orientation:  Full (Time, Place, and Person)  Thought Content:  WDL and Logical  Suicidal Thoughts:  No  Homicidal Thoughts:  No  Memory:  Immediate;   Good Recent;   Good Remote;   Good  Judgement:  Fair  Insight:  Fair  Psychomotor  Activity:  Normal  Concentration:  Concentration: Good and Attention Span: Good  Recall:  Good  Fund of Knowledge:  Fair  Language:  Good  Akathisia:  No  Handed:  Right  AIMS (if indicated):     Assets:  Housing Leisure Time Physical Health Resilience Social Support  ADL's:  Intact  Cognition:  WNL  Sleep:       Mental Status Per Nursing Assessment::   On Admission:   hallucinations and suicidal ideations  Demographic Factors:  Male  Loss Factors: NA  Historical Factors: NA  Risk Reduction Factors:   Sense of responsibility to family, Living with another person, especially a relative, Positive social support and Positive therapeutic relationship  Continued Clinical Symptoms:  Anxiety, mild  Cognitive Features That Contribute To Risk:  None    Suicide Risk:  Minimal: No identifiable suicidal ideation.  Patients presenting with no risk factors but with morbid ruminations; may be classified as minimal risk based on the severity of the depressive symptoms    Plan Of Care/Follow-up recommendations:  Activity:  as tolerated Diet:  heart healhty diet  LORD, JAMISON, NP 10/25/2017, 10:28 AM

## 2018-03-23 IMAGING — CR DG CHEST 2V
2 series · 2 of 2 positions shown · non-contrast
Comparison: May 06, 2016

CLINICAL DATA: Shortness of breath and cough for 2 weeks.

EXAM:
CHEST  2 VIEW

[chest pa]
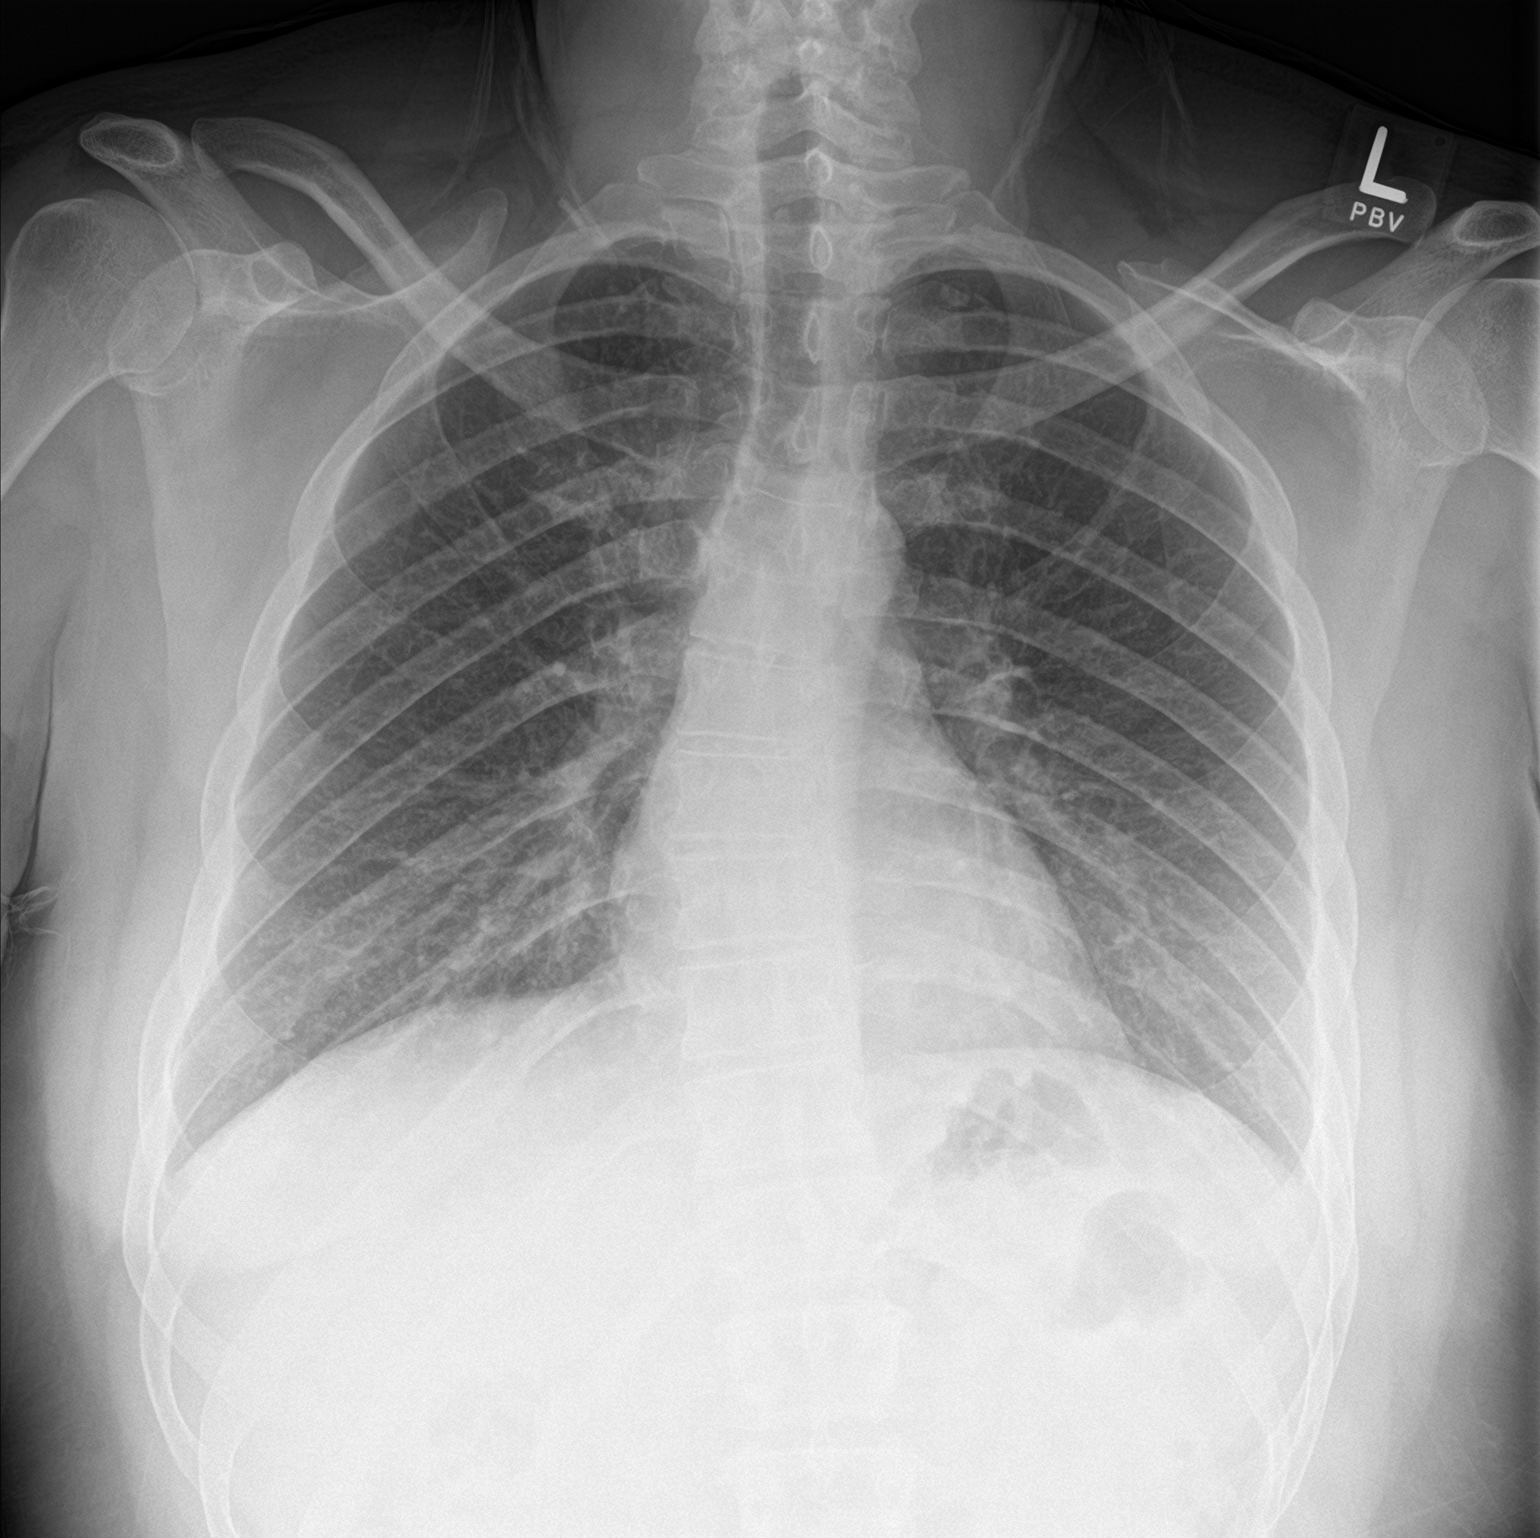

[chest lat]
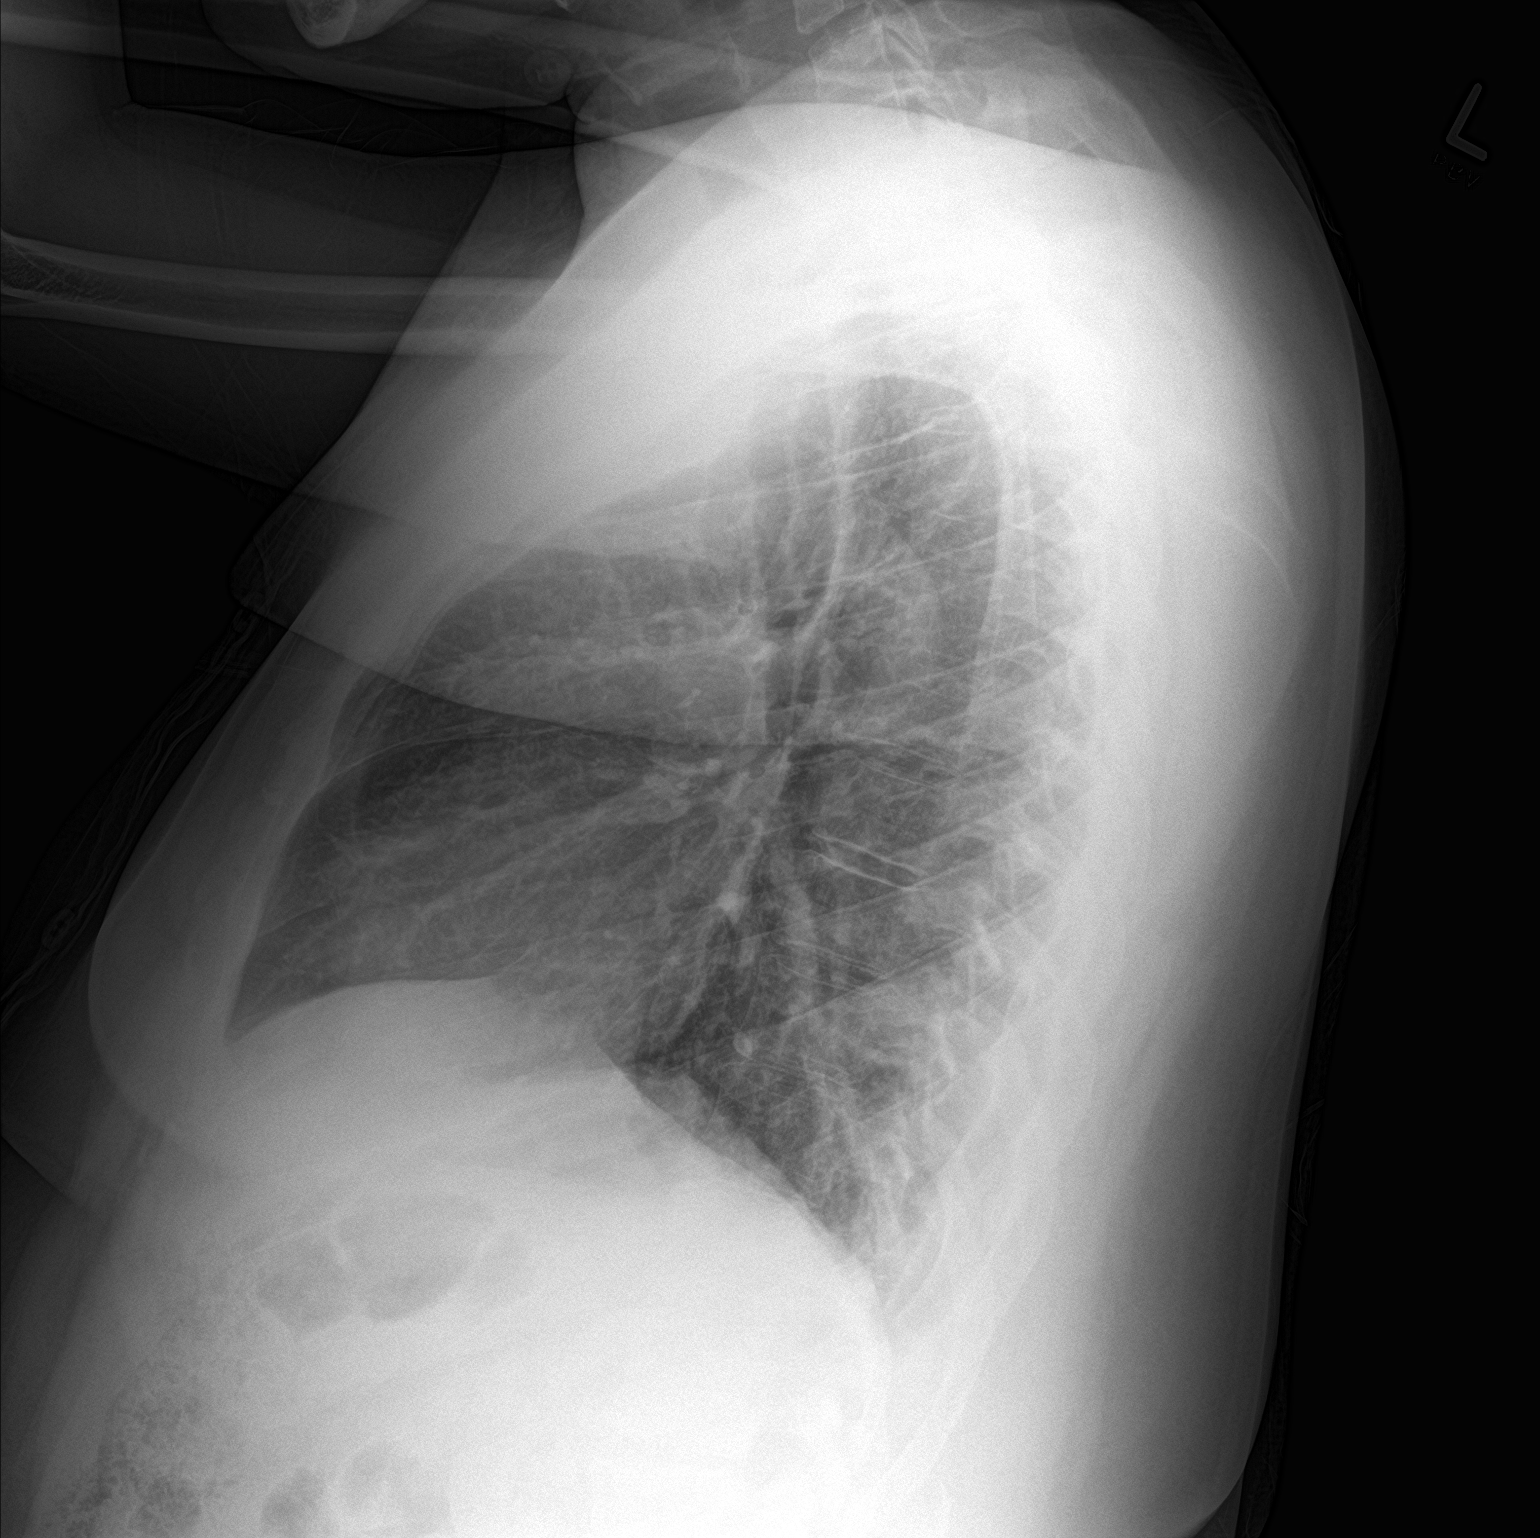

[2 of 2 positions shown; findings below may reference images not displayed]

FINDINGS: The heart size and mediastinal contours are within normal limits.
Both lungs are clear. The visualized skeletal structures are
unremarkable.
IMPRESSION: No active cardiopulmonary disease.

## 2019-04-02 ENCOUNTER — Other Ambulatory Visit: Payer: Self-pay

## 2019-04-02 ENCOUNTER — Encounter (HOSPITAL_COMMUNITY): Payer: Self-pay | Admitting: Emergency Medicine

## 2019-04-02 ENCOUNTER — Emergency Department (HOSPITAL_COMMUNITY)
Admission: EM | Admit: 2019-04-02 | Discharge: 2019-04-03 | Disposition: A | Discharge status: Home / Self Care | Payer: Self-pay | Attending: Emergency Medicine | Admitting: Emergency Medicine

## 2019-04-02 ENCOUNTER — Emergency Department (HOSPITAL_COMMUNITY): Payer: Self-pay

## 2019-04-02 DIAGNOSIS — F17210 Nicotine dependence, cigarettes, uncomplicated: Secondary | ICD-10-CM | POA: Insufficient documentation

## 2019-04-02 DIAGNOSIS — J449 Chronic obstructive pulmonary disease, unspecified: Secondary | ICD-10-CM | POA: Insufficient documentation

## 2019-04-02 DIAGNOSIS — Z79899 Other long term (current) drug therapy: Secondary | ICD-10-CM | POA: Insufficient documentation

## 2019-04-02 DIAGNOSIS — J209 Acute bronchitis, unspecified: Secondary | ICD-10-CM | POA: Insufficient documentation

## 2019-04-02 HISTORY — DX: Chronic obstructive pulmonary disease, unspecified: J44.9

## 2019-04-02 NOTE — ED Triage Notes (Signed)
Patient reports productive cough and chest congestion for >1 week, denies fever or chills .

## 2019-04-03 MED ORDER — PREDNISONE 20 MG PO TABS: 40.0000 mg | ORAL_TABLET | Freq: Every day | ORAL | 0 refills | Status: DC

## 2019-04-03 MED ORDER — BENZONATATE 100 MG PO CAPS: 100.0000 mg | ORAL_CAPSULE | Freq: Three times a day (TID) | ORAL | 0 refills | Status: DC | PRN

## 2019-04-03 MED ORDER — PREDNISONE 20 MG PO TABS
60.0000 mg | ORAL_TABLET | Freq: Once | ORAL | Status: AC
  Administered 2019-04-03: 60 mg via ORAL
  Filled 2019-04-03: qty 3

## 2019-04-03 MED ORDER — ALBUTEROL SULFATE HFA 108 (90 BASE) MCG/ACT IN AERS
4.0000 | INHALATION_SPRAY | Freq: Once | RESPIRATORY_TRACT | Status: AC
  Administered 2019-04-03: 4 via RESPIRATORY_TRACT
  Filled 2019-04-03: qty 6.7

## 2019-04-03 NOTE — ED Notes (Signed)
ED Provider at bedside. 

## 2019-04-03 NOTE — Discharge Instructions (Signed)
Take prednisone as prescribed until finished.  Use 2 puffs of an albuterol inhaler every 4-6 hours for wheezing, cough, shortness of breath.  You may use Tessalon for management of cough.  We also recommend the use of over-the-counter Mucinex.  Follow-up with a primary care doctor to ensure that symptoms resolve.

## 2019-04-03 NOTE — ED Provider Notes (Signed)
[facility] EMERGENCY DEPARTMENT Provider Note   CSN: [id number] Arrival date & time: 04/02/19  2313     History   Chief Complaint Chief Complaint  Patient presents with  . Cough    HPI Ronald Martinez is a 38 y.o. male.     38 year old male presents to the emergency department for cough which has been persistent over the past 4 to 5 days.  Cough productive of a clear phlegm and he has had some associated nasal congestion and chest congestion.  No medications taken prior to arrival for symptoms.  Does have a history of asthma, but has not utilized any inhalers.  Smokes 1 pack of cigarettes per day.  Denies fevers, chills, sick contacts, vomiting, sore throat.  The history is provided by the patient. No language interpreter was used.  Cough   Past Medical History:  Diagnosis Date  . Asthma   . Bipolar 1 disorder ([place])   . COPD (chronic obstructive pulmonary disease) ([place])   . Depression   . Schizophrenia [facility])     Patient Active Problem List   Diagnosis Date Noted  . Benzodiazepine abuse in remission ([place]) 01/09/2016  . Cannabis use disorder, severe, dependence ([place]) 08/17/2014  . Schizoaffective disorder, bipolar type ([place]) 08/16/2014    History reviewed. No pertinent surgical history.      Home Medications    Prior to Admission medications   Medication Sig Start Date End Date Taking? Authorizing Provider  albuterol (PROVENTIL HFA;VENTOLIN HFA) 108 (90 Base) MCG/ACT inhaler Inhale 1-2 puffs into the lungs every 6 (six) hours as needed for wheezing or shortness of breath. 08/20/17   Lindell Spar I, NP  benzonatate (TESSALON) 100 MG capsule Take 1 capsule (100 mg total) by mouth 3 (three) times daily as needed for cough. 04/03/19   Antonietta Breach, PA-C  benztropine (COGENTIN) 0.5 MG tablet Take 0.5 mg by mouth 2 (two) times daily.    [provider]  buPROPion 450 MG TB24 Take 450 mg by mouth daily. For depression 08/21/17   Lindell Spar I, NP   gabapentin (NEURONTIN) 300 MG capsule Take 1 capsule (300 mg total) by mouth 3 (three) times daily. For agitation 08/20/17   Lindell Spar I, NP  hydrOXYzine (ATARAX/VISTARIL) 25 MG tablet Take 1 tablet (25 mg total) by mouth 3 (three) times daily as needed for anxiety. 08/20/17   Lindell Spar I, NP  paliperidone (INVEGA) 6 MG 24 hr tablet Take 1 tablet (6 mg total) by mouth daily. For mood control 08/21/17   Lindell Spar I, NP  pantoprazole (PROTONIX) 40 MG tablet Take 1 tablet (40 mg total) by mouth daily. For acid reflux 08/21/17   Lindell Spar I, NP  predniSONE (DELTASONE) 20 MG tablet Take 2 tablets (40 mg total) by mouth daily. 04/03/19   Antonietta Breach, PA-C  traZODone (DESYREL) 50 MG tablet Take 1 tablet (50 mg total) by mouth at bedtime as needed for sleep. 08/20/17   Encarnacion Slates, NP    Family History Family History  Problem Relation Age of Onset  . Asthma Mother   . Alcoholism Other     Social History Social History   Tobacco Use  . Smoking status: Current Some Day Smoker    Packs/day: 1.00    Types: Cigarettes  . Smokeless tobacco: Never Used  Substance Use Topics  . Alcohol use: Yes    Comment: only drinks on occasion  . Drug use: Yes    Types: Marijuana, Benzodiazepines  Allergies   Salami [pickled meat]   Review of Systems Review of Systems  Respiratory: Positive for cough.   Ten systems reviewed and are negative for acute change, except as noted in the HPI.    Physical Exam Updated Vital Signs BP 133/67 (BP Location: Right Arm)   Pulse 100   Temp 98.1 F (36.7 C) (Oral)   Resp 20   SpO2 98%   Physical Exam Vitals signs and nursing note reviewed.  Constitutional:      General: He is not in acute distress.    Appearance: He is well-developed. He is not diaphoretic.     Comments: Nontoxic appearing and in NAD  HENT:     Head: Normocephalic and atraumatic.  Eyes:     General: No scleral icterus.    Conjunctiva/sclera: Conjunctivae normal.  Neck:      Musculoskeletal: Normal range of motion.  Cardiovascular:     Rate and Rhythm: Normal rate and regular rhythm.     Pulses: Normal pulses.  Pulmonary:     Effort: Pulmonary effort is normal. No respiratory distress.     Breath sounds: No stridor. Wheezing (diffuse expiratory) present. No rales.     Comments: Respirations even and unlabored Musculoskeletal: Normal range of motion.  Skin:    General: Skin is warm and dry.     Coloration: Skin is not pale.     Findings: No erythema or rash.  Neurological:     Mental Status: He is alert and oriented to person, place, and time.  Psychiatric:        Behavior: Behavior normal.      ED Treatments / Results  Labs (all labs ordered are listed, but only abnormal results are displayed) Labs Reviewed - No data to display  EKG None  Radiology Dg Chest 2 View  Result Date: 04/02/2019 CLINICAL DATA:  Productive cough, chest congestion EXAM: CHEST - 2 VIEW COMPARISON:  Radiograph 08/14/2017 FINDINGS: No consolidation, features of edema, pneumothorax, or effusion. Pulmonary vascularity is normally distributed. The cardiomediastinal contours are unremarkable. No acute osseous or soft tissue abnormality. IMPRESSION: No acute cardiopulmonary abnormality. Electronically Signed   By: Kreg Shropshire M.D.   On: 04/02/2019 23:44    Procedures Procedures (including critical care time)  Medications Ordered in ED Medications  predniSONE (DELTASONE) tablet 60 mg (60 mg Oral Given 04/03/19 0406)  albuterol (VENTOLIN HFA) 108 (90 Base) MCG/ACT inhaler 4 puff (4 puffs Inhalation Given 04/03/19 0405)    4:37 AM Interval improvement to wheezing after albuterol.   Moving air well.  Patient continues to be without respiratory distress.  He has no hypoxia.    Initial Impression / Assessment and Plan / ED Course  I have reviewed the triage vital signs and the nursing notes.  Pertinent labs & imaging results that were available during my care of the  patient were reviewed by me and considered in my medical decision making (see chart for details).        CXR negative for acute infiltrate. Patient's symptoms are consistent with acute bronchitis, likely viral etiology. Discussed that antibiotics are not indicated for viral infections. Patient will be discharged with symptomatic treatment including burst of prednisone and PRN Tessalon.  Patient verbalizes understanding and is agreeable with plan. Return precautions discussed and provided. Patient discharged in stable condition with no unaddressed concerns.   Final Clinical Impressions(s) / ED Diagnoses   Final diagnoses:  Acute bronchitis, unspecified organism    ED Discharge Orders  Ordered    predniSONE (DELTASONE) 20 MG tablet  Daily     04/03/19 0430    benzonatate (TESSALON) 100 MG capsule  3 times daily PRN     04/03/19 0430           Antony MaduraHumes, Severiano Utsey, PA-C 04/03/19 0439    Dione BoozeGlick, David, MD 04/03/19 [id number]

## 2019-04-18 ENCOUNTER — Other Ambulatory Visit: Payer: Self-pay

## 2019-04-18 ENCOUNTER — Emergency Department (HOSPITAL_COMMUNITY)
Admission: EM | Admit: 2019-04-18 | Discharge: 2019-04-19 | Disposition: A | Discharge status: Home / Self Care | Payer: Medicaid - Out of State | Attending: Emergency Medicine | Admitting: Emergency Medicine

## 2019-04-18 ENCOUNTER — Encounter (HOSPITAL_COMMUNITY): Payer: Self-pay

## 2019-04-18 ENCOUNTER — Emergency Department (HOSPITAL_COMMUNITY): Payer: Medicaid - Out of State

## 2019-04-18 DIAGNOSIS — R45850 Homicidal ideations: Secondary | ICD-10-CM | POA: Diagnosis not present

## 2019-04-18 DIAGNOSIS — R05 Cough: Secondary | ICD-10-CM | POA: Diagnosis not present

## 2019-04-18 DIAGNOSIS — F259 Schizoaffective disorder, unspecified: Secondary | ICD-10-CM

## 2019-04-18 DIAGNOSIS — F250 Schizoaffective disorder, bipolar type: Secondary | ICD-10-CM | POA: Diagnosis not present

## 2019-04-18 DIAGNOSIS — R059 Cough, unspecified: Secondary | ICD-10-CM

## 2019-04-18 DIAGNOSIS — Z20828 Contact with and (suspected) exposure to other viral communicable diseases: Secondary | ICD-10-CM | POA: Insufficient documentation

## 2019-04-18 DIAGNOSIS — F17210 Nicotine dependence, cigarettes, uncomplicated: Secondary | ICD-10-CM | POA: Insufficient documentation

## 2019-04-18 DIAGNOSIS — F29 Unspecified psychosis not due to a substance or known physiological condition: Secondary | ICD-10-CM | POA: Diagnosis present

## 2019-04-18 DIAGNOSIS — Z79899 Other long term (current) drug therapy: Secondary | ICD-10-CM | POA: Diagnosis not present

## 2019-04-18 DIAGNOSIS — J449 Chronic obstructive pulmonary disease, unspecified: Secondary | ICD-10-CM | POA: Insufficient documentation

## 2019-04-18 LAB — COMPREHENSIVE METABOLIC PANEL
ALT: 24 U/L (ref 0–44)
AST: 25 U/L (ref 15–41)
Albumin: 4.1 g/dL (ref 3.5–5.0)
Alkaline Phosphatase: 74 U/L (ref 38–126)
Anion gap: 10 (ref 5–15)
BUN: 11 mg/dL (ref 6–20)
CO2: 24 mmol/L (ref 22–32)
Calcium: 9 mg/dL (ref 8.9–10.3)
Chloride: 104 mmol/L (ref 98–111)
Creatinine, Ser: 0.89 mg/dL (ref 0.61–1.24)
GFR calc Af Amer: >60 mL/min (ref >60)
GFR calc non Af Amer: >60 mL/min (ref >60)
Glucose, Bld: 113 mg/dL — ABNORMAL HIGH (ref 70–99)
Potassium: 3.9 mmol/L (ref 3.5–5.1)
Sodium: 138 mmol/L (ref 135–145)
Total Bilirubin: 0.5 mg/dL (ref 0.3–1.2)
Total Protein: 7 g/dL (ref 6.5–8.1)

## 2019-04-18 LAB — SARS CORONAVIRUS 2 (TAT 6-24 HRS): SARS Coronavirus 2: NEGATIVE

## 2019-04-18 LAB — CBC
HCT: 42.9 % (ref 39.0–52.0)
Hemoglobin: 14 g/dL (ref 13.0–17.0)
MCH: 27.5 pg (ref 26.0–34.0)
MCHC: 32.6 g/dL (ref 30.0–36.0)
MCV: 84.1 fL (ref 80.0–100.0)
Platelets: 302 10*3/uL (ref 150–400)
RBC: 5.1 MIL/uL (ref 4.22–5.81)
RDW: 15.7 % — ABNORMAL HIGH (ref 11.5–15.5)
WBC: 8.1 10*3/uL (ref 4.0–10.5)
nRBC: 0 % (ref 0.0–0.2)

## 2019-04-18 LAB — RAPID URINE DRUG SCREEN, HOSP PERFORMED
Amphetamines: NOT DETECTED
Barbiturates: NOT DETECTED
Benzodiazepines: NOT DETECTED
Cocaine: NOT DETECTED
Opiates: NOT DETECTED
Tetrahydrocannabinol: POSITIVE — AB

## 2019-04-18 LAB — ETHANOL: Alcohol, Ethyl (B): <10 mg/dL (ref <10)

## 2019-04-18 LAB — SALICYLATE LEVEL: Salicylate Lvl: <7 mg/dL (ref 2.8–30.0)

## 2019-04-18 LAB — ACETAMINOPHEN LEVEL: Acetaminophen (Tylenol), Serum: <10 ug/mL — ABNORMAL LOW (ref 10–30)

## 2019-04-18 MED ORDER — ALBUTEROL SULFATE HFA 108 (90 BASE) MCG/ACT IN AERS
6.0000 | INHALATION_SPRAY | Freq: Once | RESPIRATORY_TRACT | Status: AC
  Administered 2019-04-18: 6 via RESPIRATORY_TRACT
  Filled 2019-04-18: qty 6.7

## 2019-04-18 MED ORDER — PREDNISONE 20 MG PO TABS
60.0000 mg | ORAL_TABLET | Freq: Every day | ORAL | Status: DC
  Administered 2019-04-18 – 2019-04-19 (×2): 60 mg via ORAL
  Filled 2019-04-18 (×2): qty 3

## 2019-04-18 NOTE — ED Provider Notes (Signed)
[facility] Provider Note   CSN: [id number] Arrival date & time: 04/18/19  0004     History   Chief Complaint Chief Complaint  Patient presents with  . Psychiatric Evaluation  . Homicidal  . Cough    HPI Ronald Martinez is a 38 y.o. male with history of schizophrenia, bipolar 1 disorder, depression, asthma, COPD who presents with homicidal ideation as well as cough for 2 weeks.  Patient reports he has had thoughts of wanting to kill his neighbors for the past week.  He denies any SI or AVH.  He came voluntarily and wants help for these thoughts.  Patient is also had a productive cough.  Per chart review, patient was seen for this about 2 weeks ago and had a negative chest x-ray.  Patient has not been taking any medications at home for symptoms.  He denies any fevers, chest pain, shortness of breath, abdominal pain, nausea, vomiting.   He is having trouble staying awake during my exam and states he is just tired. He denies drug use.     HPI  Past Medical History:  Diagnosis Date  . Asthma   . Bipolar 1 disorder (HCC)   . COPD (chronic obstructive pulmonary disease) (HCC)   . Depression   . Schizophrenia [facility](HCC)     Patient Active Problem List   Diagnosis Date Noted  . 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 Cannabis use disorder, severe, dependence (HCC) 08/17/2014  . Schizoaffective disorder, bipolar type (HCC) 08/16/2014    History reviewed. No pertinent surgical history.      Home Medications    Prior to Admission medications   Medication Sig Start Date End Date Taking? Authorizing Provider  albuterol (PROVENTIL HFA;VENTOLIN HFA) 108 (90 Base) MCG/ACT inhaler Inhale 1-2 puffs into the lungs every 6 (six) hours as needed for wheezing or shortness of breath. Patient not taking: Reported on 04/18/2019 08/20/17   Armandina StammerNwoko, Agnes I, NP  benzonatate (TESSALON) 100 MG capsule Take 1 capsule (100 mg total) by mouth 3 (three)  times daily as needed for cough. Patient not taking: Reported on 04/18/2019 04/03/19   Antony MaduraHumes, Kelly, PA-C  buPROPion 450 MG TB24 Take 450 mg by mouth daily. For depression Patient not taking: Reported on 04/18/2019 08/21/17   Armandina StammerNwoko, Agnes I, NP  gabapentin (NEURONTIN) 300 MG capsule Take 1 capsule (300 mg total) by mouth 3 (three) times daily. For agitation Patient not taking: Reported on 04/18/2019 08/20/17   Armandina StammerNwoko, Agnes I, NP  hydrOXYzine (ATARAX/VISTARIL) 25 MG tablet Take 1 tablet (25 mg total) by mouth 3 (three) times daily as needed for anxiety. Patient not taking: Reported on 04/18/2019 08/20/17   Armandina StammerNwoko, Agnes I, NP  paliperidone (INVEGA) 6 MG 24 hr tablet Take 1 tablet (6 mg total) by mouth daily. For mood control Patient not taking: Reported on 04/18/2019 08/21/17   Armandina StammerNwoko, Agnes I, NP  pantoprazole (PROTONIX) 40 MG tablet Take 1 tablet (40 mg total) by mouth daily. For acid reflux Patient not taking: Reported on 04/18/2019 08/21/17   Armandina StammerNwoko, Agnes I, NP  predniSONE (DELTASONE) 20 MG tablet Take 2 tablets (40 mg total) by mouth daily. Patient not taking: Reported on 04/18/2019 04/03/19   Antony MaduraHumes, Kelly, PA-C  traZODone (DESYREL) 50 MG tablet Take 1 tablet (50 mg total) by mouth at bedtime as needed for sleep. Patient not taking: Reported on 04/18/2019 08/20/17   Sanjuana KavaNwoko, Agnes I, NP    Family History Family History  Problem  Relation Age of Onset  . Asthma Mother   . Alcoholism Other     Social History Social History   Tobacco Use  . Smoking status: Current Some Day Smoker    Packs/day: 1.00    Types: Cigarettes  . Smokeless tobacco: Never Used  Substance Use Topics  . Alcohol use: Yes    Comment: only drinks on occasion  . Drug use: Yes    Types: Marijuana, Benzodiazepines     Allergies   Salami [pickled meat]   Review of Systems Review of Systems  Constitutional: Negative for chills and fever.  HENT: Negative for facial swelling and sore throat.   Respiratory: Positive  for cough and wheezing. Negative for shortness of breath.   Cardiovascular: Negative for chest pain.  Gastrointestinal: Negative for abdominal pain, nausea and vomiting.  Genitourinary: Negative for dysuria.  Musculoskeletal: Negative for back pain.  Skin: Negative for rash and wound.  Neurological: Negative for headaches.  Psychiatric/Behavioral: Negative for hallucinations and suicidal ideas. The patient is not nervous/anxious.      Physical Exam Updated Vital Signs BP 109/66 (BP Location: Right Arm)   Pulse 75   Temp (!) 97.5 F (36.4 C) (Oral)   Resp 16   SpO2 100%   Physical Exam Vitals signs and nursing note reviewed.  Constitutional:      General: He is not in acute distress.    Appearance: He is well-developed. He is not diaphoretic.     Comments: Patient very sleepy on exam, easily arousable, but quickly falling back to sleep  HENT:     Head: Normocephalic and atraumatic.     Mouth/Throat:     Pharynx: No oropharyngeal exudate.  Eyes:     General: No scleral icterus.       Right eye: No discharge.        Left eye: No discharge.     Conjunctiva/sclera: Conjunctivae normal.     Pupils: Pupils are equal, round, and reactive to light.  Neck:     Musculoskeletal: Normal range of motion and neck supple.     Thyroid: No thyromegaly.  Cardiovascular:     Rate and Rhythm: Normal rate and regular rhythm.     Heart sounds: Normal heart sounds. No murmur. No friction rub. No gallop.   Pulmonary:     Effort: Pulmonary effort is normal. No respiratory distress.     Breath sounds: No stridor. Wheezing (expiratory on the left) present. No rales.  Abdominal:     General: Bowel sounds are normal. There is no distension.     Palpations: Abdomen is soft.     Tenderness: There is no abdominal tenderness. There is no guarding or rebound.  Lymphadenopathy:     Cervical: No cervical adenopathy.  Skin:    General: Skin is warm and dry.     Coloration: Skin is not pale.      Findings: No rash.  Neurological:     Mental Status: He is alert.     Coordination: Coordination normal.  Psychiatric:        Thought Content: Thought content includes homicidal ideation. Thought content does not include suicidal ideation. Thought content does not include suicidal plan.      ED Treatments / Results  Labs (all labs ordered are listed, but only abnormal results are displayed) Labs Reviewed  COMPREHENSIVE METABOLIC PANEL - Abnormal; Notable for the following components:      Result Value   Glucose, Bld 113 (*)    All  other components within normal limits  ACETAMINOPHEN LEVEL - Abnormal; Notable for the following components:   Acetaminophen (Tylenol), Serum <10 (*)    All other components within normal limits  CBC - Abnormal; Notable for the following components:   RDW 15.7 (*)    All other components within normal limits  RAPID URINE DRUG SCREEN, HOSP PERFORMED - Abnormal; Notable for the following components:   Tetrahydrocannabinol POSITIVE (*)    All other components within normal limits  SARS CORONAVIRUS 2 (TAT 6-24 HRS)  ETHANOL  SALICYLATE LEVEL    EKG None  Radiology Dg Chest 2 View  Result Date: 04/18/2019 CLINICAL DATA:  Productive cough for 2 weeks EXAM: CHEST - 2 VIEW COMPARISON:  04/02/2019 FINDINGS: Cardiac shadow is stable. Patient is somewhat rotated to the left accentuating the mediastinal markings. No focal infiltrate or sizable effusion is seen. No bony abnormality is noted. IMPRESSION: No acute abnormality noted. Electronically Signed   By: Inez Catalina M.D.   On: 04/18/2019 02:55    Procedures Procedures (including critical care time)  Medications Ordered in ED Medications  predniSONE (DELTASONE) tablet 60 mg (has no administration in time range)  albuterol (VENTOLIN HFA) 108 (90 Base) MCG/ACT inhaler 6 puff (6 puffs Inhalation Given 04/18/19 0341)     Initial Impression / Assessment and Plan / ED Course  I have reviewed the triage  vital signs and the nursing notes.  Pertinent labs & imaging results that were available during my care of the patient were reviewed by me and considered in my medical decision making (see chart for details).        Patient with homicidal ideation in 2-week history of cough.  Labs are unremarkable.  Chest x-ray is clear.  Patient can be medically cleared, but will treat with prednisone for 5 days for probable asthma exacerbation considering wheezing.  Continue albuterol every 6 hours as needed for wheezing.  TTS recommends AM psych eval. Patient reports he is not currently taking any medications.  Final Clinical Impressions(s) / ED Diagnoses   Final diagnoses:  Cough  Homicidal ideation    ED Discharge Orders    None       Frederica Kuster, PA-C 45/40/98 1191    Delora Fuel, MD 47/82/95 [phone]

## 2019-04-18 NOTE — ED Notes (Signed)
Pt arrived to [facility] via stretcher - wearing burgundy scrubs. Pt ambulatory to bed w/o difficulty. Pt voiced understanding and agreement w/tx plan - [facility] seeking Inpt. Pt given warm blankets as requested. States he ate lunch.

## 2019-04-18 NOTE — ED Triage Notes (Addendum)
Pt from home w/ a c/o HI and a productive cough for 2 weeks. Pt reports he has homicidal feelings towards a particular person, but to no one else. Denies SI. Is voluntarily seeking psychiatric eval and tx. Denies auditory or visual hallucinations.   Pt repeatedly falling asleep during triage. Denies drug use at this time. States he is tired.   Pt changed into purple scrubs. Belongings removed and placed in locker 6 (2 belongings bags and 1 book bag) Belongings have not been inventoried. Wanded and cleared by security.

## 2019-04-18 NOTE — Consult Note (Signed)
  Patient assessed and evaluated case discussed with nursing staff. Patient is alert and oriented. He continues to endorse ongoing homicidal ideations and is requesting additional services for his mental health. He is seeking mental health stabilization as he has been wanting to harm someone for a long time, and he is unable to get rid of these thoughts. He endorses wanting to harm someone due to increase agitation and irritability and easily annoyed by small things. He is not taking any medications at this time and has a history of schizoaffective and aCT team services. Will recommend inpatient admission as patient continues endorse suicidal ideation, homicidal towards self and appears to be in an acute state.

## 2019-04-18 NOTE — Discharge Instructions (Addendum)
Take prednisone as prescribed for 5 total days (start your prescription tomorrow).  Use albuterol inhaler every 6 hours as needed for wheezing or shortness of breath.  Please return to the emergency department if you develop any new or worsening symptoms.

## 2019-04-18 NOTE — Progress Notes (Signed)
Patient meets criteria for inpatient treatment per Sheran Fava, NP. CSW faxed referrals to the following facilities for review: [facility]   [facility]   [facility]   [facility]   [facility]   [facility]   [facility]      TSS will continue to seek bed placement.   Darletta Moll MSW, [place] Worker Disposition  [facility] Ph: [phone] Fax: [phone]

## 2019-04-18 NOTE — ED Notes (Signed)
Lab Tech to call back - inquiring about pt's COVID-19 test.

## 2019-04-18 NOTE — ED Notes (Addendum)
Pt changed into purple top and pants.

## 2019-04-18 NOTE — ED Notes (Signed)
TTS in progress 

## 2019-04-18 NOTE — ED Notes (Signed)
Breakfast Ordered 
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
## 2019-04-19 MED ORDER — ALBUTEROL SULFATE HFA 108 (90 BASE) MCG/ACT IN AERS: 2.0000 | INHALATION_SPRAY | RESPIRATORY_TRACT | 0 refills | Status: AC | PRN

## 2019-04-19 MED ORDER — PREDNISONE 20 MG PO TABS: ORAL_TABLET | ORAL | 0 refills | Status: DC

## 2019-04-19 NOTE — ED Triage Notes (Signed)
Pt reports he does not feel HI today. Pt reports he has made up his mind to stay away from that side of town. Pt would like to go home today if he can.

## 2019-04-19 NOTE — Progress Notes (Signed)
Pt accepted to [facility]; bed 507-2     Dr. Dwyane Dee is the accepting provider.    Dr. Parke Poisson is the attending provider.    Call report to [place] [facility] ED notified.     Pt is voluntary and will be transported by [facility]  Pt is scheduled to arrive at [facility] at 1pm; pending his Covid results return as negative.   Audree Camel, LCSW, [place] Disposition [facility]/TTS [phone]

## 2019-04-19 NOTE — ED Notes (Signed)
Breakfast ordered for patient  

## 2019-04-19 NOTE — Progress Notes (Signed)
Patient ID: Ronald Martinez, male   DOB: 28-Jun-1980, 38 y.o.   MRN: [id number]   Reassessment  Patient was reassessed by Dr. Dwyane Dee today via telepsych although writer was present during the assessment.  Patient is alert and oriented x4, calm and cooperative. Patient presented initially with reports of homicidal and suicidal thoughts. At this time, he denies ant HI or SI. In regard to his HI, he states," I know now to just stay away from the area which is on another side of town anyway." Because he does not have current outpatient psychiatric services established, [name] was discussed and he stated that he would go to [facility] this afternoon as a walk-in.  As per Dr. Dwyane Dee, patient is being psychiatrically cleared with strong encouragement to go to [facility] to establish outpatient psychiatric services.   EPD will be updated on current disposition.

## 2019-04-19 NOTE — ED Notes (Signed)
Patient verbalizes understanding of discharge instructions . Opportunity for questions and answers were provided . Armband removed by staff ,Pt discharged from ED. W/C  offered at D/C  and Declined W/C at D/C and was escorted to lobby by RN.  

## 2019-04-19 NOTE — ED Provider Notes (Signed)
Patient is no longer psychotic or homicidal.  Cleared from a psychiatry standpoint.  Will prescribe albuterol and prednisone as per prior note for coughing and some wheezing.   Sherwood Gambler, MD 04/19/19 1248

## 2019-04-19 NOTE — ED Triage Notes (Signed)
Plan for pt DC home

## 2019-05-14 ENCOUNTER — Other Ambulatory Visit: Payer: Self-pay

## 2019-05-14 ENCOUNTER — Encounter (HOSPITAL_COMMUNITY): Payer: Self-pay | Admitting: *Deleted

## 2019-05-14 ENCOUNTER — Emergency Department (HOSPITAL_COMMUNITY): Payer: Medicaid Other

## 2019-05-14 ENCOUNTER — Emergency Department (HOSPITAL_COMMUNITY)
Admission: EM | Admit: 2019-05-14 | Discharge: 2019-05-14 | Disposition: A | Discharge status: Home / Self Care | Payer: Medicaid Other | Attending: Emergency Medicine | Admitting: Emergency Medicine

## 2019-05-14 DIAGNOSIS — R062 Wheezing: Secondary | ICD-10-CM | POA: Insufficient documentation

## 2019-05-14 DIAGNOSIS — J069 Acute upper respiratory infection, unspecified: Secondary | ICD-10-CM | POA: Diagnosis not present

## 2019-05-14 DIAGNOSIS — J449 Chronic obstructive pulmonary disease, unspecified: Secondary | ICD-10-CM | POA: Diagnosis not present

## 2019-05-14 DIAGNOSIS — Z20828 Contact with and (suspected) exposure to other viral communicable diseases: Secondary | ICD-10-CM | POA: Diagnosis not present

## 2019-05-14 DIAGNOSIS — F17210 Nicotine dependence, cigarettes, uncomplicated: Secondary | ICD-10-CM | POA: Diagnosis not present

## 2019-05-14 DIAGNOSIS — F250 Schizoaffective disorder, bipolar type: Secondary | ICD-10-CM | POA: Diagnosis not present

## 2019-05-14 DIAGNOSIS — R05 Cough: Secondary | ICD-10-CM | POA: Diagnosis present

## 2019-05-14 MED ORDER — FLUTICASONE PROPIONATE 50 MCG/ACT NA SUSP: 2.0000 | Freq: Every day | NASAL | 0 refills | Status: DC

## 2019-05-14 MED ORDER — PREDNISONE 20 MG PO TABS
60.0000 mg | ORAL_TABLET | Freq: Once | ORAL | Status: AC
  Administered 2019-05-14: 60 mg via ORAL
  Filled 2019-05-14: qty 3

## 2019-05-14 MED ORDER — ALBUTEROL SULFATE HFA 108 (90 BASE) MCG/ACT IN AERS
6.0000 | INHALATION_SPRAY | Freq: Once | RESPIRATORY_TRACT | Status: AC
  Administered 2019-05-14: 6 via RESPIRATORY_TRACT
  Filled 2019-05-14: qty 6.7

## 2019-05-14 MED ORDER — PREDNISONE 20 MG PO TABS: 60.0000 mg | ORAL_TABLET | Freq: Every day | ORAL | 0 refills | Status: AC

## 2019-05-14 NOTE — ED Notes (Signed)
Pt maintained an oxygen saturation of 95-97% on RA while ambulating independently. Pt was not tachypienic and no SOB.

## 2019-05-14 NOTE — Discharge Instructions (Addendum)
Take the medications as prescribed.  Return if you develop severe chest pain, shortness of breath, coughing up blood.  Your Covid test is pending at discharge.  You will be called if this is positive.  You will need to stay out of work until this has resulted.

## 2019-05-14 NOTE — ED Provider Notes (Signed)
[place] EMERGENCY DEPARTMENT Provider Note   CSN: [id number] Arrival date & time: 05/14/19  1857    History Chief Complaint  Patient presents with  . Cough   Ronald Martinez is a 38 y.o. male with has medical history significant for asthma, bipolar, depression, schizophrenia who presents for evaluation of cough.  Patient states he has had cough x1 week.  He is also had congestion, rhinorrhea over the last 2 weeks. He has not been using his home inhalers.  He was seen approximately 1 month ago for cough as well.  He was given steroids however he did not take these outpatient.  Denies fever, chills, nausea, vomiting.  Admits to wheeze.  No prior hospitalizations or intubations per patient.  Denies fever, chills, nausea, vomiting, chest pain, shortness of breath, hemoptysis abdominal pain, diarrhea, dysuria.  Denies additional aggravating or alleviating factors.  Patient with history of schizophrenia, denies SI, HI, AVH.  Has noted did mention blood with wiping on toilet paper.  Patient states he has known history of hemorrhoids and that was similar.  He has had multiple bowel movements since and has not had any blood in the toilet paper.  Is not noticed any blood in his stool.  No prior history of GI bleeds, NSAID use or alcohol use.  History obtained from patient and past medical records.  No interpreter is used.  HPI     Past Medical History:  Diagnosis Date  . Asthma   . Bipolar 1 disorder ([place])   . COPD (chronic obstructive pulmonary disease) ([place])   . Depression   . Schizophrenia [facility])     Patient Active Problem List   Diagnosis Date Noted  . Benzodiazepine abuse in remission ([place]) 01/09/2016  . Cannabis use disorder, severe, dependence ([place]) 08/17/2014  . Schizoaffective disorder, bipolar type ([place]) 08/16/2014    History reviewed. No pertinent surgical history.     Family History  Problem Relation Age of Onset  . Asthma Mother   . Alcoholism Other       Social History   Tobacco Use  . Smoking status: Current Some Day Smoker    Packs/day: 1.00    Types: Cigarettes  . Smokeless tobacco: Never Used  Substance Use Topics  . Alcohol use: Yes    Comment: only drinks on occasion  . Drug use: Yes    Types: Marijuana, Benzodiazepines    Home Medications Prior to Admission medications   Medication Sig Start Date End Date Taking? Authorizing Provider  albuterol (VENTOLIN HFA) 108 (90 Base) MCG/ACT inhaler Inhale 2 puffs into the lungs every 4 (four) hours as needed for wheezing or shortness of breath. 04/19/19   Sherwood Gambler, MD  benzonatate (TESSALON) 100 MG capsule Take 1 capsule (100 mg total) by mouth 3 (three) times daily as needed for cough. Patient not taking: Reported on 04/18/2019 04/03/19   Antonietta Breach, PA-C  buPROPion 450 MG TB24 Take 450 mg by mouth daily. For depression Patient not taking: Reported on 04/18/2019 08/21/17   Lindell Spar I, NP  fluticasone (FLONASE) 50 MCG/ACT nasal spray Place 2 sprays into both nostrils daily. 05/14/19   Karmin Kasprzak A, PA-C  gabapentin (NEURONTIN) 300 MG capsule Take 1 capsule (300 mg total) by mouth 3 (three) times daily. For agitation Patient not taking: Reported on 04/18/2019 08/20/17   Lindell Spar I, NP  hydrOXYzine (ATARAX/VISTARIL) 25 MG tablet Take 1 tablet (25 mg total) by mouth 3 (three) times daily as needed for anxiety. Patient  not taking: Reported on 04/18/2019 08/20/17   Armandina Stammer I, NP  paliperidone (INVEGA) 6 MG 24 hr tablet Take 1 tablet (6 mg total) by mouth daily. For mood control Patient not taking: Reported on 04/18/2019 08/21/17   Armandina Stammer I, NP  pantoprazole (PROTONIX) 40 MG tablet Take 1 tablet (40 mg total) by mouth daily. For acid reflux Patient not taking: Reported on 04/18/2019 08/21/17   Armandina Stammer I, NP  predniSONE (DELTASONE) 20 MG tablet Take 3 tablets (60 mg total) by mouth daily for 5 days. 05/14/19 05/19/19  Aishwarya Shiplett A, PA-C  traZODone  (DESYREL) 50 MG tablet Take 1 tablet (50 mg total) by mouth at bedtime as needed for sleep. Patient not taking: Reported on 04/18/2019 08/20/17   Sanjuana Kava, NP    Allergies    Neoma Laming meat]  Review of Systems   Review of Systems  Constitutional: Negative.   HENT: Positive for congestion and rhinorrhea.   Respiratory: Positive for cough and wheezing. Negative for apnea, choking, chest tightness, shortness of breath and stridor.   Cardiovascular: Negative.   Gastrointestinal: Negative for abdominal pain, constipation, diarrhea and vomiting.  Genitourinary: Negative.   Musculoskeletal: Negative.   Neurological: Negative.   All other systems reviewed and are negative.   Physical Exam Updated Vital Signs BP 133/75 (BP Location: Right Arm)   Pulse 98   Temp 98.4 F (36.9 C) (Oral)   Resp 18   SpO2 97%   Physical Exam Vitals and nursing note reviewed.  Constitutional:      General: He is not in acute distress.    Appearance: He is not ill-appearing, toxic-appearing or diaphoretic.  HENT:     Head: Normocephalic and atraumatic.     Jaw: There is normal jaw occlusion.     Right Ear: Tympanic membrane, ear canal and external ear normal. There is no impacted cerumen. No hemotympanum. Tympanic membrane is not injected, scarred, perforated, erythematous, retracted or bulging.     Left Ear: Tympanic membrane, ear canal and external ear normal. There is no impacted cerumen. No hemotympanum. Tympanic membrane is not injected, scarred, perforated, erythematous, retracted or bulging.     Ears:     Comments: No Mastoid tenderness.    Nose:     Comments: Clear rhinorrhea and congestion to bilateral nares.  No sinus tenderness.    Mouth/Throat:     Comments: Posterior oropharynx clear.  Mucous membranes moist.  Tonsils without erythema or exudate.  Uvula midline without deviation.  No evidence of PTA or RPA.  No drooling, dysphasia or trismus.  Phonation normal. Neck:      Trachea: Trachea and phonation normal.     Meningeal: Brudzinski's sign and Kernig's sign absent.     Comments: No Neck stiffness or neck rigidity.  No meningismus.  No cervical lymphadenopathy. Cardiovascular:     Comments: No murmurs rubs or gallops. Pulmonary:     Effort: Pulmonary effort is normal.     Breath sounds: Wheezing present.     Comments: Diffuse wheezing throughout.  No accessory muscle usage.  Able speak in full sentences. Abdominal:     Comments: Soft, nontender without rebound or guarding.  No CVA tenderness.  Musculoskeletal:     Comments: Moves all 4 extremities without difficulty.  Lower extremities without edema, erythema or warmth.  Skin:    Comments: Brisk capillary refill.  No rashes or lesions.  Neurological:     Mental Status: He is alert.  Comments: Ambulatory in department without difficulty.  Cranial nerves II through XII grossly intact.  No facial droop.  No aphasia.    ED Results / Procedures / Treatments   Labs (all labs ordered are listed, but only abnormal results are displayed) Labs Reviewed  SARS CORONAVIRUS 2 (TAT 6-24 HRS)    EKG None  Radiology DG Chest 2 View  Result Date: 05/14/2019 CLINICAL DATA:  Reports having a 2 week history of coughing sneezing and runny nose. EXAM: CHEST - 2 VIEW COMPARISON:  04/18/2019 FINDINGS: The heart size and mediastinal contours are within normal limits. Both lungs are clear. No pleural effusion or pneumothorax. The visualized skeletal structures are unremarkable. IMPRESSION: No active cardiopulmonary disease. Electronically Signed   By: Amie Portlandavid  Ormond M.D.   On: 05/14/2019 20:09    Procedures Procedures (including critical care time)  Medications Ordered in ED Medications  predniSONE (DELTASONE) tablet 60 mg (60 mg Oral Given 05/14/19 2122)  albuterol (VENTOLIN HFA) 108 (90 Base) MCG/ACT inhaler 6 puff (6 puffs Inhalation Given 05/14/19 2122)    ED Course  I have reviewed the triage vital signs  and the nursing notes.  Pertinent labs & imaging results that were available during my care of the patient were reviewed by me and considered in my medical decision making (see chart for details).  38 year old male appears otherwise well presents for evaluation of upper respiratory symptoms.  He has diffuse wheezing throughout however he is not tachypneic, oxygen saturation 97% on room air.  No prior admissions or intubations for asthma.  Was seen 1 month ago for similar symptoms and did not start on the steroids.  He has not been using his home albuterol.  Did mention in triage note rectal bleeding with wiping.  Patient states he has known history of hemorrhoids and this was similar.  This was approximately 3 days ago.  He has had multiple bowel movements since without any bright red blood per rectum.  Patient declined GU exam.  His abdomen is soft, nontender.  He has no active bleeding and has benign abdominal exam will just have him follow-up with GI I have low suspicion for acute intra-abdominal hemorrhage or bowel perforation causing his rectal bleeding.  Given he does have diffuse wheezing will ambulate pulse ox, provide steroids, albuterol.   Chest xray without acute infiltrates, cardiomegaly, pulmonary edema, pneumothorax.   Patient ambulatory with nurse with oxygen saturation 95 to 97% on room air while independently ambulating.  No tachypnea or shortness of breath  Patient reevaluated difficult improvement in lung sounds.  Likely viral upper respiratory infection with asthma exacerbation.  DC home with albuterol, Flonase, steroids.  Covid testing pending.   Patient ambulated in ED with O2 saturations albuterol treatment. Prednisone given in the ED and pt will be discharged with 5 day burst. Pt states they are breathing at baseline. Pt has been instructed to continue using prescribed medications and to speak with PCP about today's exacerbation.     Warnell ForesterKevin Fife was evaluated in Emergency  Department on 05/14/2019 for the symptoms described in the history of present illness. He was evaluated in the context of the global COVID-19 pandemic, which necessitated consideration that the patient might be at risk for infection with the SARS-CoV-2 virus that causes COVID-19. Institutional protocols and algorithms that pertain to the evaluation of patients at risk for COVID-19 are in a state of rapid change based on information released by regulatory bodies including the CDC and federal and state organizations. These policies  and algorithms were followed during the patient's care in the ED.   MDM Rules/Calculators/A&P     CHA2DS2/VAS Stroke Risk Points      N/A >= 2 Points: High Risk  1 - 1.99 Points: Medium Risk  0 Points: Low Risk    A final score could not be computed because of missing components.: Last  Change: N/A     This score determines the patient's risk of having a stroke if the  patient has atrial fibrillation.      This score is not applicable to this patient. Components are not  calculated.                   Final Clinical Impression(s) / ED Diagnoses Final diagnoses:  Viral URI with cough    Rx / DC Orders ED Discharge Orders         Ordered    fluticasone (FLONASE) 50 MCG/ACT nasal spray  Daily     05/14/19 2126    predniSONE (DELTASONE) 20 MG tablet  Daily     05/14/19 2126           Cesar Rogerson A, PA-C 05/14/19 2158    Tegeler, Canary Brim, MD 05/14/19 2337

## 2019-05-14 NOTE — ED Notes (Signed)
Discharge instructions discussed with pt. Pt verbalized understanding. Pt stable and ambulatory. No signature pad available. 

## 2019-05-14 NOTE — ED Triage Notes (Signed)
Pt reporting two weeks of sneezing, cough, runny nose. Pt says that on Tuesday and Wednesday he noticed blood on the toilet paper when he wiped. Denies abdominal pain or fevers. Pt also having pain on the bottoms of his feet "for years"

## 2019-05-15 LAB — SARS CORONAVIRUS 2 (TAT 6-24 HRS): SARS Coronavirus 2: NEGATIVE

## 2019-06-12 ENCOUNTER — Other Ambulatory Visit: Payer: Self-pay

## 2019-06-12 ENCOUNTER — Emergency Department (HOSPITAL_COMMUNITY): Payer: Medicaid - Out of State

## 2019-06-12 ENCOUNTER — Emergency Department (HOSPITAL_COMMUNITY)
Admission: EM | Admit: 2019-06-12 | Discharge: 2019-06-12 | Disposition: A | Discharge status: Home / Self Care | Payer: Medicaid - Out of State | Attending: Emergency Medicine | Admitting: Emergency Medicine

## 2019-06-12 DIAGNOSIS — F17210 Nicotine dependence, cigarettes, uncomplicated: Secondary | ICD-10-CM | POA: Insufficient documentation

## 2019-06-12 DIAGNOSIS — J441 Chronic obstructive pulmonary disease with (acute) exacerbation: Secondary | ICD-10-CM | POA: Insufficient documentation

## 2019-06-12 DIAGNOSIS — J45909 Unspecified asthma, uncomplicated: Secondary | ICD-10-CM | POA: Diagnosis not present

## 2019-06-12 DIAGNOSIS — R05 Cough: Secondary | ICD-10-CM | POA: Diagnosis present

## 2019-06-12 MED ORDER — ALBUTEROL SULFATE HFA 108 (90 BASE) MCG/ACT IN AERS
4.0000 | INHALATION_SPRAY | Freq: Once | RESPIRATORY_TRACT | Status: AC
  Administered 2019-06-12: 4 via RESPIRATORY_TRACT
  Filled 2019-06-12: qty 6.7

## 2019-06-12 MED ORDER — PREDNISONE 20 MG PO TABS
60.0000 mg | ORAL_TABLET | Freq: Once | ORAL | Status: AC
  Administered 2019-06-12: 09:00:00 60 mg via ORAL
  Filled 2019-06-12: qty 3

## 2019-06-12 MED ORDER — PREDNISONE 20 MG PO TABS: ORAL_TABLET | ORAL | 0 refills | Status: DC

## 2019-06-12 NOTE — Discharge Instructions (Signed)
Use the albuterol inhaler 2 puffs every 4 hours as needed for cough or shortness of breath.  Be sure to take the prednisone prescribed, starting tomorrow.  If you develop fever, chest pain, worsening shortness of breath, or any other new/concerning symptoms then return to then ER for evaluation.

## 2019-06-12 NOTE — ED Provider Notes (Signed)
[place] EMERGENCY DEPARTMENT Provider Note   CSN: [id number] Arrival date & time: 06/12/19  0257     History Chief Complaint  Patient presents with  . Shortness of Breath  . Asthma    Shaymus Eveleth is a 39 y.o. male.  HPI  39 year old male presents with chief complaint of cough and concern for COPD exacerbation.  Has had the symptoms for about 3 weeks.  Cough, sneezing, shortness of breath and clear sputum.  No fevers or chest pain.  No leg swelling.  He states that a couple weeks ago he had a negative coronavirus test and does not think it is this.  He states he ran out of his inhaler.  Feels like COPD to him.  Past Medical History:  Diagnosis Date  . Asthma   . Bipolar 1 disorder ([place])   . COPD (chronic obstructive pulmonary disease) ([place])   . Depression   . Schizophrenia [facility])     Patient Active Problem List   Diagnosis Date Noted  . Benzodiazepine abuse in remission ([place]) 01/09/2016  . Cannabis use disorder, severe, dependence ([place]) 08/17/2014  . Schizoaffective disorder, bipolar type ([place]) 08/16/2014    No past surgical history on file.     Family History  Problem Relation Age of Onset  . Asthma Mother   . Alcoholism Other     Social History   Tobacco Use  . Smoking status: Current Some Day Smoker    Packs/day: 1.00    Types: Cigarettes  . Smokeless tobacco: Never Used  Substance Use Topics  . Alcohol use: Yes    Comment: only drinks on occasion  . Drug use: Yes    Types: Marijuana, Benzodiazepines    Home Medications Prior to Admission medications   Medication Sig Start Date End Date Taking? Authorizing Provider  albuterol (VENTOLIN HFA) 108 (90 Base) MCG/ACT inhaler Inhale 2 puffs into the lungs every 4 (four) hours as needed for wheezing or shortness of breath. 04/19/19  Yes Sherwood Gambler, MD  benzonatate (TESSALON) 100 MG capsule Take 1 capsule (100 mg total) by mouth 3 (three) times daily as needed for cough. Patient  not taking: Reported on 04/18/2019 04/03/19   Antonietta Breach, PA-C  buPROPion 450 MG TB24 Take 450 mg by mouth daily. For depression Patient not taking: Reported on 04/18/2019 08/21/17   Lindell Spar I, NP  fluticasone (FLONASE) 50 MCG/ACT nasal spray Place 2 sprays into both nostrils daily. Patient not taking: Reported on 06/12/2019 05/14/19   Henderly, Britni A, PA-C  gabapentin (NEURONTIN) 300 MG capsule Take 1 capsule (300 mg total) by mouth 3 (three) times daily. For agitation Patient not taking: Reported on 04/18/2019 08/20/17   Lindell Spar I, NP  hydrOXYzine (ATARAX/VISTARIL) 25 MG tablet Take 1 tablet (25 mg total) by mouth 3 (three) times daily as needed for anxiety. Patient not taking: Reported on 04/18/2019 08/20/17   Lindell Spar I, NP  paliperidone (INVEGA) 6 MG 24 hr tablet Take 1 tablet (6 mg total) by mouth daily. For mood control Patient not taking: Reported on 04/18/2019 08/21/17   Lindell Spar I, NP  pantoprazole (PROTONIX) 40 MG tablet Take 1 tablet (40 mg total) by mouth daily. For acid reflux Patient not taking: Reported on 04/18/2019 08/21/17   Lindell Spar I, NP  predniSONE (DELTASONE) 20 MG tablet 2 tabs po daily x 4 days 06/12/19   Sherwood Gambler, MD  traZODone (DESYREL) 50 MG tablet Take 1 tablet (50 mg total) by mouth at  bedtime as needed for sleep. Patient not taking: Reported on 04/18/2019 08/20/17   Sanjuana Kava, NP    Allergies    Neoma Laming meat]  Review of Systems   Review of Systems  Constitutional: Negative for fever.  HENT: Positive for sneezing.   Respiratory: Positive for cough and shortness of breath.   Cardiovascular: Negative for chest pain.  Gastrointestinal: Negative for abdominal pain.  All other systems reviewed and are negative.   Physical Exam Updated Vital Signs BP 130/76 (BP Location: Right Arm)   Pulse 93   Temp 97.6 F (36.4 C) (Oral)   Resp 19   Ht 6' (1.829 m)   Wt 90.7 kg   SpO2 100%   BMI 27.12 kg/m   Physical  Exam Vitals and nursing note reviewed.  Constitutional:      General: He is not in acute distress.    Appearance: He is well-developed. He is not ill-appearing or diaphoretic.  HENT:     Head: Normocephalic and atraumatic.     Right Ear: External ear normal.     Left Ear: External ear normal.     Nose: Nose normal.  Eyes:     General:        Right eye: No discharge.        Left eye: No discharge.  Cardiovascular:     Rate and Rhythm: Normal rate and regular rhythm.     Heart sounds: Normal heart sounds.  Pulmonary:     Effort: Pulmonary effort is normal. No tachypnea, accessory muscle usage or respiratory distress.     Breath sounds: Examination of the right-lower field reveals decreased breath sounds. Examination of the left-lower field reveals decreased breath sounds. Decreased breath sounds and wheezing (diffuse, expiratory) present.     Comments: No increased work of breathing. Speaks clearly in complete sentences Abdominal:     Palpations: Abdomen is soft.     Tenderness: There is no abdominal tenderness.  Musculoskeletal:     Cervical back: Neck supple.     Right lower leg: No edema.     Left lower leg: No edema.  Skin:    General: Skin is warm and dry.  Neurological:     Mental Status: He is alert.  Psychiatric:        Mood and Affect: Mood is not anxious.     ED Results / Procedures / Treatments   Labs (all labs ordered are listed, but only abnormal results are displayed) Labs Reviewed - No data to display  EKG None  Radiology DG Chest Portable 1 View  Result Date: 06/12/2019 CLINICAL DATA:  Cough and shortness of breath EXAM: PORTABLE CHEST 1 VIEW COMPARISON:  May 14, 2019 FINDINGS: Lungs are clear. Heart is upper normal in size with pulmonary vascularity normal. No adenopathy. There is mid and lower thoracic dextroscoliosis. IMPRESSION: Lungs clear.  Heart upper normal in size.  No evident adenopathy. Electronically Signed   By: Bretta Bang III M.D.    On: 06/12/2019 09:01    Procedures Procedures (including critical care time)  Medications Ordered in ED Medications  albuterol (VENTOLIN HFA) 108 (90 Base) MCG/ACT inhaler 4 puff (4 puffs Inhalation Given 06/12/19 0834)  predniSONE (DELTASONE) tablet 60 mg (60 mg Oral Given 06/12/19 1761)    ED Course  I have reviewed the triage vital signs and the nursing notes.  Pertinent labs & imaging results that were available during my care of the patient were reviewed by me and considered in  my medical decision making (see chart for details).    MDM Rules/Calculators/A&P                      Patient appears to have a COPD exacerbation.  Offered repeat testing for Covid but he declines.  Vitals are stable including normal oxygenation.  Counseled on stopping smoking.  Given albuterol inhaler and steroids here as well as steroid burst.  No evidence of pneumonia.  Discharged with return precautions.  Rockland Kotarski was evaluated in Emergency Department on 06/12/2019 for the symptoms described in the history of present illness. He was evaluated in the context of the global COVID-19 pandemic, which necessitated consideration that the patient might be at risk for infection with the SARS-CoV-2 virus that causes COVID-19. Institutional protocols and algorithms that pertain to the evaluation of patients at risk for COVID-19 are in a state of rapid change based on information released by regulatory bodies including the CDC and federal and state organizations. These policies and algorithms were followed during the patient's care in the ED.  Final Clinical Impression(s) / ED Diagnoses Final diagnoses:  COPD exacerbation (HCC)    Rx / DC Orders ED Discharge Orders         Ordered    predniSONE (DELTASONE) 20 MG tablet     06/12/19 2505           Pricilla Loveless, MD 06/12/19 [phone]

## 2019-06-12 NOTE — ED Triage Notes (Signed)
Pt has hx of asthma and pt has had a cough and sob for over a week now. Pt has been tested within 14 days for covid as is negative. Pt said no fevers, but some chills.

## 2019-06-22 ENCOUNTER — Emergency Department (HOSPITAL_COMMUNITY): Payer: Medicaid Other

## 2019-06-22 ENCOUNTER — Emergency Department (HOSPITAL_COMMUNITY)
Admission: EM | Admit: 2019-06-22 | Discharge: 2019-06-22 | Disposition: A | Discharge status: Home / Self Care | Payer: Medicaid Other | Attending: Emergency Medicine | Admitting: Emergency Medicine

## 2019-06-22 ENCOUNTER — Emergency Department
Admission: EM | Admit: 2019-06-22 | Discharge: 2019-06-23 | Disposition: A | Discharge status: Rehabilitation Facility | Payer: Medicaid - Out of State | Attending: Emergency Medicine | Admitting: Emergency Medicine

## 2019-06-22 ENCOUNTER — Other Ambulatory Visit: Payer: Self-pay

## 2019-06-22 ENCOUNTER — Encounter (HOSPITAL_COMMUNITY): Payer: Self-pay | Admitting: Emergency Medicine

## 2019-06-22 DIAGNOSIS — M79673 Pain in unspecified foot: Secondary | ICD-10-CM | POA: Insufficient documentation

## 2019-06-22 DIAGNOSIS — F259 Schizoaffective disorder, unspecified: Secondary | ICD-10-CM | POA: Insufficient documentation

## 2019-06-22 DIAGNOSIS — F329 Major depressive disorder, single episode, unspecified: Secondary | ICD-10-CM | POA: Diagnosis present

## 2019-06-22 DIAGNOSIS — Z008 Encounter for other general examination: Secondary | ICD-10-CM | POA: Insufficient documentation

## 2019-06-22 DIAGNOSIS — F17210 Nicotine dependence, cigarettes, uncomplicated: Secondary | ICD-10-CM | POA: Diagnosis not present

## 2019-06-22 DIAGNOSIS — R45851 Suicidal ideations: Secondary | ICD-10-CM | POA: Diagnosis not present

## 2019-06-22 DIAGNOSIS — F250 Schizoaffective disorder, bipolar type: Secondary | ICD-10-CM | POA: Diagnosis present

## 2019-06-22 DIAGNOSIS — F3130 Bipolar disorder, current episode depressed, mild or moderate severity, unspecified: Secondary | ICD-10-CM | POA: Insufficient documentation

## 2019-06-22 DIAGNOSIS — Z20822 Contact with and (suspected) exposure to covid-19: Secondary | ICD-10-CM | POA: Diagnosis not present

## 2019-06-22 DIAGNOSIS — J449 Chronic obstructive pulmonary disease, unspecified: Secondary | ICD-10-CM | POA: Diagnosis not present

## 2019-06-22 DIAGNOSIS — Z5900 Homelessness unspecified: Secondary | ICD-10-CM

## 2019-06-22 DIAGNOSIS — J45909 Unspecified asthma, uncomplicated: Secondary | ICD-10-CM | POA: Diagnosis not present

## 2019-06-22 DIAGNOSIS — F22 Delusional disorders: Secondary | ICD-10-CM

## 2019-06-22 DIAGNOSIS — F54 Psychological and behavioral factors associated with disorders or diseases classified elsewhere: Secondary | ICD-10-CM

## 2019-06-22 DIAGNOSIS — R45850 Homicidal ideations: Secondary | ICD-10-CM | POA: Insufficient documentation

## 2019-06-22 DIAGNOSIS — Z79899 Other long term (current) drug therapy: Secondary | ICD-10-CM | POA: Diagnosis not present

## 2019-06-22 DIAGNOSIS — Z139 Encounter for screening, unspecified: Secondary | ICD-10-CM

## 2019-06-22 DIAGNOSIS — F1220 Cannabis dependence, uncomplicated: Secondary | ICD-10-CM | POA: Diagnosis present

## 2019-06-22 DIAGNOSIS — F1311 Sedative, hypnotic or anxiolytic abuse, in remission: Secondary | ICD-10-CM | POA: Diagnosis present

## 2019-06-22 LAB — COMPREHENSIVE METABOLIC PANEL
ALT: 21 U/L (ref 0–44)
ALT: 21 U/L (ref 0–44)
AST: 23 U/L (ref 15–41)
AST: 23 U/L (ref 15–41)
Albumin: 3.8 g/dL (ref 3.5–5.0)
Albumin: 4.5 g/dL (ref 3.5–5.0)
Alkaline Phosphatase: 71 U/L (ref 38–126)
Alkaline Phosphatase: 82 U/L (ref 38–126)
Anion gap: 10 (ref 5–15)
Anion gap: 7 (ref 5–15)
BUN: 13 mg/dL (ref 6–20)
BUN: 18 mg/dL (ref 6–20)
CO2: 27 mmol/L (ref 22–32)
CO2: 32 mmol/L (ref 22–32)
Calcium: 9.1 mg/dL (ref 8.9–10.3)
Calcium: 9.4 mg/dL (ref 8.9–10.3)
Chloride: 104 mmol/L (ref 98–111)
Chloride: 101 mmol/L (ref 98–111)
Creatinine, Ser: 0.88 mg/dL (ref 0.61–1.24)
Creatinine, Ser: 0.87 mg/dL (ref 0.61–1.24)
GFR calc Af Amer: >60 mL/min (ref >60)
GFR calc Af Amer: >60 mL/min (ref >60)
GFR calc non Af Amer: >60 mL/min (ref >60)
GFR calc non Af Amer: >60 mL/min (ref >60)
Glucose, Bld: 110 mg/dL — ABNORMAL HIGH (ref 70–99)
Glucose, Bld: 105 mg/dL — ABNORMAL HIGH (ref 70–99)
Potassium: 3.7 mmol/L (ref 3.5–5.1)
Potassium: 3.4 mmol/L — ABNORMAL LOW (ref 3.5–5.1)
Sodium: 141 mmol/L (ref 135–145)
Sodium: 140 mmol/L (ref 135–145)
Total Bilirubin: 0.7 mg/dL (ref 0.3–1.2)
Total Bilirubin: 0.7 mg/dL (ref 0.3–1.2)
Total Protein: 6.7 g/dL (ref 6.5–8.1)
Total Protein: 7.4 g/dL (ref 6.5–8.1)

## 2019-06-22 LAB — CBC
HCT: 41.6 % (ref 39.0–52.0)
Hemoglobin: 13.8 g/dL (ref 13.0–17.0)
MCH: 27.5 pg (ref 26.0–34.0)
MCHC: 33.2 g/dL (ref 30.0–36.0)
MCV: 83 fL (ref 80.0–100.0)
Platelets: 291 10*3/uL (ref 150–400)
RBC: 5.01 MIL/uL (ref 4.22–5.81)
RDW: 14.9 % (ref 11.5–15.5)
WBC: 7.2 10*3/uL (ref 4.0–10.5)
nRBC: 0 % (ref 0.0–0.2)

## 2019-06-22 LAB — URINE DRUG SCREEN, QUALITATIVE (ARMC ONLY)
Amphetamines, Ur Screen: NOT DETECTED
Barbiturates, Ur Screen: NOT DETECTED
Benzodiazepine, Ur Scrn: NOT DETECTED
Cannabinoid 50 Ng, Ur ~~LOC~~: POSITIVE — AB
Cocaine Metabolite,Ur ~~LOC~~: NOT DETECTED
MDMA (Ecstasy)Ur Screen: NOT DETECTED
Methadone Scn, Ur: NOT DETECTED
Opiate, Ur Screen: NOT DETECTED
Phencyclidine (PCP) Ur S: NOT DETECTED
Tricyclic, Ur Screen: NOT DETECTED

## 2019-06-22 LAB — CBC WITH DIFFERENTIAL/PLATELET
Abs Immature Granulocytes: 0.01 10*3/uL (ref 0.00–0.07)
Basophils Absolute: 0 10*3/uL (ref 0.0–0.1)
Basophils Relative: 1 %
Eosinophils Absolute: 0.2 10*3/uL (ref 0.0–0.5)
Eosinophils Relative: 5 %
HCT: 43.1 % (ref 39.0–52.0)
Hemoglobin: 14.1 g/dL (ref 13.0–17.0)
Immature Granulocytes: 0 %
Lymphocytes Relative: 48 %
Lymphs Abs: 2.3 10*3/uL (ref 0.7–4.0)
MCH: 27.4 pg (ref 26.0–34.0)
MCHC: 32.7 g/dL (ref 30.0–36.0)
MCV: 83.9 fL (ref 80.0–100.0)
Monocytes Absolute: 0.3 10*3/uL (ref 0.1–1.0)
Monocytes Relative: 7 %
Neutro Abs: 1.9 10*3/uL (ref 1.7–7.7)
Neutrophils Relative %: 39 %
Platelets: 306 10*3/uL (ref 150–400)
RBC: 5.14 MIL/uL (ref 4.22–5.81)
RDW: 14.7 % (ref 11.5–15.5)
WBC: 4.8 10*3/uL (ref 4.0–10.5)
nRBC: 0 % (ref 0.0–0.2)

## 2019-06-22 LAB — URINALYSIS, ROUTINE W REFLEX MICROSCOPIC
Bilirubin Urine: NEGATIVE
Glucose, UA: NEGATIVE mg/dL
Hgb urine dipstick: NEGATIVE
Ketones, ur: NEGATIVE mg/dL
Leukocytes,Ua: NEGATIVE
Nitrite: NEGATIVE
Protein, ur: NEGATIVE mg/dL
Specific Gravity, Urine: 1.019 (ref 1.005–1.030)
pH: 7 (ref 5.0–8.0)

## 2019-06-22 LAB — RAPID URINE DRUG SCREEN, HOSP PERFORMED
Amphetamines: NOT DETECTED
Barbiturates: NOT DETECTED
Benzodiazepines: NOT DETECTED
Cocaine: NOT DETECTED
Opiates: NOT DETECTED
Tetrahydrocannabinol: POSITIVE — AB

## 2019-06-22 LAB — RESPIRATORY PANEL BY RT PCR (FLU A&B, COVID)
Influenza A by PCR: NEGATIVE
Influenza B by PCR: NEGATIVE
SARS Coronavirus 2 by RT PCR: NEGATIVE

## 2019-06-22 LAB — ETHANOL
Alcohol, Ethyl (B): <10 mg/dL (ref <10)
Alcohol, Ethyl (B): <10 mg/dL (ref <10)

## 2019-06-22 LAB — ACETAMINOPHEN LEVEL: Acetaminophen (Tylenol), Serum: <10 ug/mL — ABNORMAL LOW (ref 10–30)

## 2019-06-22 LAB — SALICYLATE LEVEL: Salicylate Lvl: <7 mg/dL — ABNORMAL LOW (ref 7.0–30.0)

## 2019-06-22 MED ORDER — HYDROXYZINE HCL 25 MG PO TABS: 25.0000 mg | ORAL_TABLET | Freq: Three times a day (TID) | ORAL | 0 refills | Status: AC | PRN

## 2019-06-22 MED ORDER — PALIPERIDONE ER 6 MG PO TB24: 6.0000 mg | ORAL_TABLET | Freq: Every day | ORAL | 0 refills | Status: AC

## 2019-06-22 MED ORDER — TRAZODONE HCL 50 MG PO TABS: 50.0000 mg | ORAL_TABLET | Freq: Every evening | ORAL | 0 refills | Status: AC | PRN

## 2019-06-22 MED ORDER — BUPROPION HCL ER (XL) 450 MG PO TB24: 450.0000 mg | ORAL_TABLET | Freq: Every day | ORAL | 0 refills | Status: AC

## 2019-06-22 NOTE — Progress Notes (Signed)
CSW provided cab voucher for patient to discharge to [facility] [place] for homeless shelter placement.   Edwin Dada, MSW, LCSW-A Transitions of Care  Clinical Social Worker  [facility] Emergency Departments  Medical ICU [phone]

## 2019-06-22 NOTE — ED Provider Notes (Signed)
[facility] Emergency Department Provider Note   ____________________________________________   First MD Initiated Contact with Patient 06/22/19 2126     (approximate)  I have reviewed the triage vital signs and the nursing notes.   HISTORY  Chief Complaint Depression, suicidal ideation    HPI Bearl Talarico is a 39 y.o. male reports he was seen in [place] this morning, and he is here now because he was unable to get into the shelter in [place]  Patient reports he was told when he left [place] he would have a bed at a shelter here in [place].  When he should showed up there they told him they had not heard from [facility] and that he did not have a bed.  Reports he then got upset, started thinking about suicidal thoughts.  He denies desire to kill anyone else, but reports he felt like he may be wanted to hurt somebody because the guys he is working with her been after him in [place] or making threats, but feels safe here in our ER and does not want her anyone now.  He does have a plan to hurt himself, reports that his biggest issue is that he is between situation somewhat homeless at the moment  Denies hallucinations.  Denies plan to hurt himself, just was having thoughts or voicing suicidal thoughts when he found to have a bed at the shelter  Patient is agreeable to staying here for evaluation by psychiatry   Past Medical History:  Diagnosis Date  . Asthma   . Bipolar 1 disorder ([place])   . COPD (chronic obstructive pulmonary disease) ([place])   . Depression   . Schizophrenia [facility])     Patient Active Problem List   Diagnosis Date Noted  . Benzodiazepine abuse in remission ([place]) 01/09/2016  . Cannabis use disorder, severe, dependence ([place]) 08/17/2014  . Schizoaffective disorder, bipolar type ([place]) 08/16/2014    History reviewed. No pertinent surgical history.  Prior to Admission medications   Medication Sig Start Date End  Date Taking? Authorizing Provider  albuterol (VENTOLIN HFA) 108 (90 Base) MCG/ACT inhaler Inhale 2 puffs into the lungs every 4 (four) hours as needed for wheezing or shortness of breath. 04/19/19   Sherwood Gambler, MD  benzonatate (TESSALON) 100 MG capsule Take 1 capsule (100 mg total) by mouth 3 (three) times daily as needed for cough. Patient not taking: Reported on 04/18/2019 04/03/19   Antonietta Breach, PA-C  buPROPion HCl ER, XL, 450 MG TB24 Take 450 mg by mouth daily. For depression 06/22/19   Carmin Muskrat, MD  fluticasone [facility]) 50 MCG/ACT nasal spray Place 2 sprays into both nostrils daily. Patient not taking: Reported on 06/12/2019 05/14/19   Henderly, Britni A, PA-C  gabapentin (NEURONTIN) 300 MG capsule Take 1 capsule (300 mg total) by mouth 3 (three) times daily. For agitation Patient not taking: Reported on 04/18/2019 08/20/17   Lindell Spar I, NP  hydrOXYzine (ATARAX/VISTARIL) 25 MG tablet Take 1 tablet (25 mg total) by mouth 3 (three) times daily as needed for anxiety. 06/22/19   Carmin Muskrat, MD  paliperidone (INVEGA) 6 MG 24 hr tablet Take 1 tablet (6 mg total) by mouth daily. For mood control 06/22/19   Carmin Muskrat, MD  pantoprazole (PROTONIX) 40 MG tablet Take 1 tablet (40 mg total) by mouth daily. For acid reflux Patient not taking: Reported on 04/18/2019 08/21/17   Lindell Spar I, NP  predniSONE (DELTASONE) 20 MG tablet 2 tabs po daily x 4  days Patient not taking: Reported on 06/22/2019 06/12/19   Pricilla Loveless, MD  traZODone (DESYREL) 50 MG tablet Take 1 tablet (50 mg total) by mouth at bedtime as needed for sleep. 06/22/19   Gerhard Munch, MD    Allergies Neoma Laming meat]  Family History  Problem Relation Age of Onset  . Asthma Mother   . Alcoholism Other     Social History Social History   Tobacco Use  . Smoking status: Current Some Day Smoker    Packs/day: 1.00    Types: Cigarettes  . Smokeless tobacco: Never Used  Substance Use Topics  .  Alcohol use: Yes    Comment: only drinks on occasion  . Drug use: Yes    Types: Marijuana, Benzodiazepines    Comment: smokes marijuana once weekly    Review of Systems Constitutional: No fever/chills or Covid exposure and tells me he was tested for earlier today negative Eyes: No visual changes. ENT: No sore throat. Cardiovascular: Denies chest pain. Respiratory: Denies shortness of breath. Gastrointestinal: No abdominal pain.   Psychiatric, see HPI    ____________________________________________   PHYSICAL EXAM:  VITAL SIGNS: ED Triage Vitals  Enc Vitals Group     BP 06/22/19 1948 140/84     Pulse Rate 06/22/19 1948 (!) 104     Resp 06/22/19 1948 16     Temp 06/22/19 1948 97.6 F (36.4 C)     Temp Source 06/22/19 1948 Oral     SpO2 06/22/19 1948 98 %     Weight 06/22/19 1949 199 lb (90.3 kg)     Height 06/22/19 1949 6' (1.829 m)     Head Circumference --      Peak Flow --      Pain Score 06/22/19 1949 0     Pain Loc --      Pain Edu? --      Excl. in GC? --     Constitutional: Alert and oriented. Well appearing and in no acute distress.  Resting comfortably on recliner in the hallway. Eyes: Conjunctivae are normal. Head: Atraumatic. Nose: No congestion/rhinnorhea. Mouth/Throat: Mucous membranes are moist. Neck: No stridor.  Cardiovascular: Normal rate, regular rhythm.  Respiratory: Normal respiratory effort.  No retractions.  Gastrointestinal: Soft and nontender. No distention. Musculoskeletal: No lower extremity tenderness nor edema. Neurologic:  Normal speech and language. No gross focal neurologic deficits are appreciated.  Skin:  Skin is warm, dry and intact. No rash noted. Psychiatric: Mood and affect are normal. Speech and behavior are normal.  ____________________________________________   LABS (all labs ordered are listed, but only abnormal results are displayed)  Labs Reviewed  COMPREHENSIVE METABOLIC PANEL - Abnormal; Notable for the  following components:      Result Value   Potassium 3.4 (*)    Glucose, Bld 105 (*)    All other components within normal limits  SALICYLATE LEVEL - Abnormal; Notable for the following components:   Salicylate Lvl <7.0 (*)    All other components within normal limits  ACETAMINOPHEN LEVEL - Abnormal; Notable for the following components:   Acetaminophen (Tylenol), Serum <10 (*)    All other components within normal limits  URINE DRUG SCREEN, QUALITATIVE ([facility] ONLY) - Abnormal; Notable for the following components:   Cannabinoid 50 Ng, Ur [place] POSITIVE (*)    All other components within normal limits  ETHANOL  CBC   ____________________________________________  EKG   ____________________________________________  RADIOLOGY   ____________________________________________   PROCEDURES  Procedure(s) performed: None  Procedures  Critical Care performed: No  ____________________________________________   INITIAL IMPRESSION / ASSESSMENT AND PLAN / ED COURSE  Pertinent labs & imaging results that were available during my care of the patient were reviewed by me and considered in my medical decision making (see chart for details).   Patient is medically cleared for psychiatric evaluation at this time.  Seen and evaluated in [place] earlier today, Covid negative.  His primary complaint seems to be not having access to homeless shelter this evening, he does very passively reports suicidal thoughts that occurred after the homeless shelter could not let them in, but at the present time he is resting comfortably and feels safe and comfortable with plan to rest here and see psychiatry.  Overall I do not believe the patient needs under under IVC, I question there is certainly elements of possible malingering his symptoms seem to be acutely tied to not being able to secure a bed at the shelter.  No acute distress.  Patient voluntary, awaiting psychiatric consult.  Discussed with Gillermo Murdoch nurse practitioner      ____________________________________________   FINAL CLINICAL IMPRESSION(S) / ED DIAGNOSES  Final diagnoses:  Encounter for medical screening examination  medical screening exam      Note:  This document was prepared using Dragon voice recognition software and may include unintentional dictation errors       Sharyn Creamer, MD 06/22/19 2327

## 2019-06-22 NOTE — ED Triage Notes (Signed)
Pt arrives to ED from [facility] with complaints of being suicidal and homicidal for the past week. Patient states that a "certain individual" thinks the patient stole something and is now out to get him. Patient has no plan at this time.

## 2019-06-22 NOTE — Social Work (Signed)
PT called to report that he arrived at shelter and there was no bed available.

## 2019-06-22 NOTE — ED Notes (Signed)
Pt. Transferred from Triage to room 24 H after dressing out and screening for contraband.  Pt. Oriented to [place] including Q15 minute rounds as well as Psychologist, counselling for their protection. Patient is alert and oriented, warm and dry in no acute distress. Patient reported SI without a plan. Denied HI, and AVH. Pt. Encouraged to let me know if needs arise.

## 2019-06-22 NOTE — ED Triage Notes (Addendum)
Pt here from home with complaints of depression and suicidal thoughts for the past few hours. Pt states he will jump out of his car.   Pt denies homicidal thoughts.   Pt was at [facility] today for the same issues, states he does not feel safe at work or where he lives.

## 2019-06-22 NOTE — ED Notes (Addendum)
Pts belongings include - jeans, black socks, wallet, phone, cigarettes, 2 lighter, black shoes, grey shirt, grey underwear, light blue pants, tic tacs, phone charger and inhaler, three large jackets (2 black and one blue and black). Black boots a black crocs, Conservation officer, nature, two towels and EIGHT BAGS IN TOTAL OF BELONGINGS. All labeled.

## 2019-06-22 NOTE — ED Provider Notes (Signed)
[facility] EMERGENCY DEPARTMENT Provider Note   CSN: [id number] Arrival date & time: 06/22/19  2774     History Chief Complaint  Patient presents with  . Medical Clearance  . Psychiatric Evaluation    Ronald Martinez is a 39 y.o. male.  HPI     Patient presents from a local shelter with concern of homicidal ideation and paranoia. Patient himself complains only of physical pain in his feet, denies other specific elements. However, the patient notes that for the past week he has felt threatened, as though his life is endangered.  He cannot specify who is threatening him, nor what it may be.  He notes that during this same timeframe he has felt homicidal towards individuals whom he will not specify.  He denies suicidal ideation. He acknowledges psychiatric disorder, states that he is not taking any medication currently, has no affiliation with a mental health center.   Past Medical History:  Diagnosis Date  . Asthma   . Bipolar 1 disorder ([place])   . COPD (chronic obstructive pulmonary disease) ([place])   . Depression   . Schizophrenia [facility])     Patient Active Problem List   Diagnosis Date Noted  . Benzodiazepine abuse in remission ([place]) 01/09/2016  . Cannabis use disorder, severe, dependence ([place]) 08/17/2014  . Schizoaffective disorder, bipolar type ([place]) 08/16/2014    History reviewed. No pertinent surgical history.     Family History  Problem Relation Age of Onset  . Asthma Mother   . Alcoholism Other     Social History   Tobacco Use  . Smoking status: Current Some Day Smoker    Packs/day: 1.00    Types: Cigarettes  . Smokeless tobacco: Never Used  Substance Use Topics  . Alcohol use: Yes    Comment: only drinks on occasion  . Drug use: Yes    Types: Marijuana, Benzodiazepines    Comment: smokes marijuana once weekly    Home Medications Prior to Admission medications   Medication Sig Start Date End Date Taking? Authorizing Provider    albuterol (VENTOLIN HFA) 108 (90 Base) MCG/ACT inhaler Inhale 2 puffs into the lungs every 4 (four) hours as needed for wheezing or shortness of breath. 04/19/19  Yes Sherwood Gambler, MD  benzonatate (TESSALON) 100 MG capsule Take 1 capsule (100 mg total) by mouth 3 (three) times daily as needed for cough. Patient not taking: Reported on 04/18/2019 04/03/19   Antonietta Breach, PA-C  buPROPion HCl ER, XL, 450 MG TB24 Take 450 mg by mouth daily. For depression 06/22/19   Carmin Muskrat, MD  fluticasone [facility]) 50 MCG/ACT nasal spray Place 2 sprays into both nostrils daily. Patient not taking: Reported on 06/12/2019 05/14/19   Henderly, Britni A, PA-C  gabapentin (NEURONTIN) 300 MG capsule Take 1 capsule (300 mg total) by mouth 3 (three) times daily. For agitation Patient not taking: Reported on 04/18/2019 08/20/17   Lindell Spar I, NP  hydrOXYzine (ATARAX/VISTARIL) 25 MG tablet Take 1 tablet (25 mg total) by mouth 3 (three) times daily as needed for anxiety. 06/22/19   Carmin Muskrat, MD  paliperidone (INVEGA) 6 MG 24 hr tablet Take 1 tablet (6 mg total) by mouth daily. For mood control 06/22/19   Carmin Muskrat, MD  pantoprazole (PROTONIX) 40 MG tablet Take 1 tablet (40 mg total) by mouth daily. For acid reflux Patient not taking: Reported on 04/18/2019 08/21/17   Lindell Spar I, NP  predniSONE (DELTASONE) 20 MG tablet 2 tabs po daily x 4  days Patient not taking: Reported on 06/22/2019 06/12/19   Pricilla Loveless, MD  traZODone (DESYREL) 50 MG tablet Take 1 tablet (50 mg total) by mouth at bedtime as needed for sleep. 06/22/19   Gerhard Munch, MD    Allergies    Clinton Sawyer [pickled meat]  Review of Systems   Review of Systems  Unable to perform ROS: Psychiatric disorder    Physical Exam Updated Vital Signs BP (!) 143/86 (BP Location: Right Arm)   Pulse 76   Temp 98.3 F (36.8 C) (Oral)   Resp 14   SpO2 100%   Physical Exam Vitals and nursing note reviewed.  Constitutional:      General:  He is not in acute distress.    Appearance: He is well-developed.  HENT:     Head: Normocephalic and atraumatic.  Eyes:     Conjunctiva/sclera: Conjunctivae normal.  Cardiovascular:     Rate and Rhythm: Normal rate and regular rhythm.  Pulmonary:     Effort: Pulmonary effort is normal. No respiratory distress.     Breath sounds: No stridor.  Abdominal:     General: There is no distension.  Musculoskeletal:     Comments: Dry feet otherwise unremarkable  Skin:    General: Skin is warm and dry.  Neurological:     Mental Status: He is alert and oriented to person, place, and time.  Psychiatric:        Thought Content: Thought content is paranoid and delusional. Thought content includes homicidal ideation. Thought content does not include suicidal ideation. Thought content does not include homicidal plan.        Cognition and Memory: Cognition is impaired.     ED Results / Procedures / Treatments   Labs (all labs ordered are listed, but only abnormal results are displayed) Labs Reviewed  COMPREHENSIVE METABOLIC PANEL - Abnormal; Notable for the following components:      Result Value   Glucose, Bld 110 (*)    All other components within normal limits  RAPID URINE DRUG SCREEN, HOSP PERFORMED - Abnormal; Notable for the following components:   Tetrahydrocannabinol POSITIVE (*)    All other components within normal limits  RESPIRATORY PANEL BY RT PCR (FLU A&B, COVID)  ETHANOL  CBC WITH DIFFERENTIAL/PLATELET  URINALYSIS, ROUTINE W REFLEX MICROSCOPIC    EKG EKG Interpretation  Date/Time:  Tuesday June 22 2019 08:56:24 EST Ventricular Rate:  80 PR Interval:    QRS Duration: 78 QT Interval:  351 QTC Calculation: 405 R Axis:   80 Text Interpretation: Sinus rhythm ST-t wave abnormality Baseline wander No significant change since last tracing Abnormal ECG Confirmed by Gerhard Munch [phone]) on 06/22/2019 8:59:06 AM   Radiology DG Chest Port 1 View  Result Date:  06/22/2019 CLINICAL DATA:  Altered mental status.  Psychiatric evaluation. EXAM: PORTABLE CHEST 1 VIEW COMPARISON:  Single-view of the chest 06/12/2019. PA and lateral chest 08/14/2017. FINDINGS: The lungs are clear. Heart size is normal. No pneumothorax or pleural effusion. No acute or focal bony abnormality. IMPRESSION: Negative chest. Electronically Signed   By: Drusilla Kanner M.D.   On: 06/22/2019 09:12    Procedures Procedures (including critical care time)  Medications Ordered in ED Medications - No data to display  ED Course  I have reviewed the triage vital signs and the nursing notes.  Pertinent labs & imaging results that were available during my care of the patient were reviewed by me and considered in my medical decision making (see chart for details).  MDM Rules/Calculators/A&P                      This patient presents with the need for medical evaluation due to ongoing psychiatric condition.  The patient's medical portion of the evaluation is generally reassuring, with no evidence of acute new pathology.  The patient has been medically cleared for further psychiatric evaluation. He has foot pain, no other physical complaints, on exam feet are unremarkable, hemodynamically unremarkable, is awake, alert, in no distress.   1:03 PM Patient in no distress he has been seen and evaluated by our behavioral health team and I have discussed their evaluation with them. Patient identifies not at risk for suicidal activity, nor homicidal actions. He is deemed appropriate for discharge with outpatient mental health follow-up. I follow the recommended medications on discharge, and the patient was provided prescriptions for these. Final Clinical Impression(s) / ED Diagnoses Final diagnoses:    Paranoia [facility])      Gerhard Munch, MD 06/22/19 1309
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
## 2019-06-22 NOTE — Discharge Instructions (Signed)
Please be sure to follow-up with your mental health provider to ensure appropriate ongoing care and management of your condition. If you return to [place], please be sure to follow-up with your physician there.  Return here for concerning changes in your condition.

## 2019-06-22 NOTE — Progress Notes (Signed)
Pt verbalized understanding of DC instructions. Given 2 taxi vouchers, 1 to [employer] where his belongings are, the other to [facility] [place] for homeless shelter placement. Also given Rx for refills.

## 2019-06-22 NOTE — ED Notes (Signed)
Hourly rounding reveals patient in room. No complaints, stable, in no acute distress. Q15 minute rounds and monitoring via Rover and Officer to continue.   

## 2019-06-23 NOTE — Consult Note (Signed)
  Patient reevaled this morning. Agree with the assessment written last night by NP. This morning patient denied SI. Reported wanting shelter placement. Spoke to social work who will plan to find patient in bed in a homeless shelter nearby.  Patient to be discharged.

## 2019-06-23 NOTE — Consult Note (Signed)
[facility] Face-to-Face Psychiatry Consult   Reason for Consult: Depression, suicidal ideation Referring Physician: Dr. 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 male patient presented to [facility] ED via POV voluntary.The patient arrives at the ED from [facility] with suicidal and homicidal complaints for the past week.  The [facility] also [facility] (01[facility]19[facility]) by TTS counselor Mr. Josephina Gip and Hillery Jacks. Their disposition reviewed that the patient does not meet psychiatric inpatient admission criteria and can be provided with homeless resources. The patient was seen face-to-face by this provider; chart reviewed and consulted with Dr. Dolores Frame on 06/22/2019 due to the patient's care. It was discussed with the EDP that the patient would be observed overnight and reassess in the a.m. to determine if he meets criteria for psychiatric inpatient admission or can be discharged to a shelter.  The patient is alert and oriented x 4, calm, cooperative, and mood-congruent with affect on evaluation.  The patient does not appear to be responding to internal or external stimuli. Neither is the patient presenting with any delusional thinking. The patient denies auditory or visual hallucinations. The patient admits to suicidal ideation but denies homicidal or self-harm ideations. The patient is not presenting with any psychotic or paranoid behaviors. During an encounter with the patient, he was able to answer questions appropriately. Collateral was obtained the patient voiced, "I have no family or friends that you can  call and talk to." Plan: The patient is not a safety risk to himself or others and will be observed overnight and reassess in the a.m. to determine if he meets criteria for psychiatric inpatient admission or can be discharged to a shelter.   HPI: Per Dr. Fanny Bien: Ronald Martinez is a 39 y.o. male reports he was seen in [place] this morning, and he is here now because he was unable to get into the shelter in [place]  Patient reports he was told when he left  he would have a bed at a shelter here in [place].  When he should showed up there they told him they had not heard from [facility] and that he did not have a bed.  Reports he then got upset, started thinking about suicidal thoughts.  He denies desire to kill anyone else, but reports he felt like he may be wanted to hurt somebody because the guys he is working with her been after him in [place] or making threats, but feels safe here in our ER and does not want her anyone now.  He does have a plan to hurt himself, reports that his biggest issue is that he is between situation somewhat homeless at the moment  Denies hallucinations.  Denies plan to hurt himself, just was having thoughts or voicing suicidal thoughts when he found to have a bed at the shelter  Patient is agreeable to staying here for evaluation by psychiatry  Past Psychiatric History:  Bipolar 1 disorder (HCC) Depression Schizophrenia (HCC)  Risk to Self:   No Risk to Others:   No Prior Inpatient Therapy:   Yes Prior Outpatient Therapy:   Yes  Past Medical History:  Past Medical History:  Diagnosis  Date  . Asthma   . Bipolar 1 disorder (HCC)   . COPD (chronic obstructive pulmonary disease) (HCC)   . Depression   . Schizophrenia (HCC)    History reviewed. No pertinent surgical history. Family History:  Family History  Problem Relation Age of Onset  . Asthma Mother   . Alcoholism Other    Family Psychiatric  History:  Social History:   Social History   Substance and Sexual Activity  Alcohol Use Yes   Comment: only drinks on occasion     Social History   Substance and Sexual Activity  Drug Use Yes  . Types: Marijuana, Benzodiazepines   Comment: smokes marijuana once weekly    Social History   Socioeconomic History  . Marital status: Single    Spouse name: Not on file  . Number of children: Not on file  . Years of education: Not on file  . Highest education level: Not on file  Occupational History  . Not on file  Tobacco Use  . Smoking status: Current Some Day Smoker    Packs/day: 1.00    Types: Cigarettes  . Smokeless tobacco: Never Used  Substance and Sexual Activity  . Alcohol use: Yes    Comment: only drinks on occasion  . Drug use: Yes    Types: Marijuana, Benzodiazepines    Comment: smokes marijuana once weekly  . Sexual activity: Never  Other Topics Concern  . Not on file  Social History Narrative  . Not on file   Social Determinants of Health   Financial Resource Strain:   . Difficulty of Paying Living Expenses: Not on file  Food Insecurity:   . Worried About Programme researcher, broadcasting/film/video in the Last Year: Not on file  . Ran Out of Food in the Last Year: Not on file  Transportation Needs:   . Lack of Transportation (Medical): Not on file  . Lack of Transportation (Non-Medical): Not on file  Physical Activity:   . Days of Exercise per Week: Not on file  . Minutes of Exercise per Session: Not on file  Stress:   . Feeling of Stress : Not on file  Social Connections:   . Frequency of Communication with Friends and Family: Not on file  . Frequency of Social Gatherings with Friends and Family: Not on file  . Attends Religious Services: Not on file  . Active Member of Clubs or Organizations: Not on file  . Attends Banker Meetings: Not on file  . Marital Status: Not on file   Additional Social History:    Allergies:   Allergies  Allergen Reactions  . Neoma Laming Meat]  Nausea And Vomiting    Allergic to bologna and salami    Labs:  Results for orders placed or performed during the hospital encounter of 06/22/19 (from the past 48 hour(s))  Comprehensive metabolic panel     Status: Abnormal   Collection Time: 06/22/19  7:51 PM  Result Value Ref Range   Sodium 140 135 - 145 mmol/L   Potassium 3.4 (L) 3.5 - 5.1 mmol/L   Chloride 101 98 - 111 mmol/L   CO2 32 22 - 32 mmol/L   Glucose, Bld 105 (H) 70 - 99 mg/dL   BUN 18 6 - 20 mg/dL   Creatinine, Ser 3.23 0.61 - 1.24 mg/dL   Calcium 9.4 8.9 - 55.7 mg/dL   Total Protein 7.4 6.5 - 8.1 g/dL   Albumin 4.5 3.5 - 5.0 g/dL   AST  23 15 - 41 U/L   ALT 21 0 - 44 U/L   Alkaline Phosphatase 82 38 - 126 U/L   Total Bilirubin 0.7 0.3 - 1.2 mg/dL   GFR calc non Af Amer >60 >60 mL/min   GFR calc Af Amer >60 >60 mL/min   Anion gap 7 5 - 15    Comment: Performed at [facility], [address] [place], [place] [address]  Ethanol     Status: None   Collection Time: 06/22/19  7:51 PM  Result Value Ref Range   Alcohol, Ethyl (B) <10 <10 mg/dL    Comment: (NOTE) Lowest detectable limit for serum alcohol is 10 mg/dL. For medical purposes only. Performed at [facility], [address] [place], [place] [address]   Salicylate level     Status: Abnormal   Collection Time: 06/22/19  7:51 PM  Result Value Ref Range   Salicylate Lvl <7.0 (L) 7.0 - 30.0 mg/dL    Comment: Performed at [facility], [address] [place], [place] [address]  Acetaminophen level     Status: Abnormal   Collection Time: 06/22/19  7:51 PM  Result Value Ref Range   Acetaminophen (Tylenol), Serum <10 (L) 10 - 30 ug/mL    Comment: (NOTE) Therapeutic concentrations vary significantly. A range of 10-30 ug/mL  may be an effective concentration for many patients. However, some  are best treated at concentrations outside of this range. Acetaminophen concentrations >150 ug/mL at 4 hours after ingestion  and >50  ug/mL at 12 hours after ingestion are often associated with  toxic reactions. Performed at [facility], [address] [place], [place] [address]   cbc     Status: None   Collection Time: 06/22/19  7:51 PM  Result Value Ref Range   WBC 7.2 4.0 - 10.5 K/uL   RBC 5.01 4.22 - 5.81 MIL/uL   Hemoglobin 13.8 13.0 - 17.0 g/dL   HCT 36.641.6 44.039.0 - 34.752.0 %   MCV 83.0 80.0 - 100.0 fL   MCH 27.5 26.0 - 34.0 pg   MCHC 33.2 30.0 - 36.0 g/dL   RDW 42.514.9 95.611.5 - 38.715.5 %   Platelets 291 150 - 400 K/uL   nRBC 0.0 0.0 - 0.2 %    Comment: Performed at [facility], [address] [place], [place] [address]  Urine Drug Screen, Qualitative     Status: Abnormal   Collection Time: 06/22/19  7:55 PM  Result Value Ref Range   Tricyclic, Ur Screen NONE DETECTED NONE DETECTED   Amphetamines, Ur Screen NONE DETECTED NONE DETECTED   MDMA (Ecstasy)Ur Screen NONE DETECTED NONE DETECTED   Cocaine Metabolite,Ur [place] NONE DETECTED NONE DETECTED   Opiate, Ur Screen NONE DETECTED NONE DETECTED   Phencyclidine (PCP) Ur S NONE DETECTED NONE DETECTED   Cannabinoid 50 Ng, Ur [place] POSITIVE (A) NONE DETECTED   Barbiturates, Ur Screen NONE DETECTED NONE DETECTED   Benzodiazepine, Ur Scrn NONE DETECTED NONE DETECTED   Methadone Scn, Ur NONE DETECTED NONE DETECTED    Comment: (NOTE) Tricyclics + metabolites, urine    Cutoff 1000 ng/mL Amphetamines + metabolites, urine  Cutoff 1000 ng/mL MDMA (Ecstasy), urine              Cutoff 500 ng/mL Cocaine Metabolite, urine          Cutoff 300 ng/mL Opiate + metabolites, urine        Cutoff 300 ng/mL Phencyclidine (PCP), urine  Cutoff 25 ng/mL Cannabinoid, urine                 Cutoff 50 ng/mL Barbiturates + metabolites, urine  Cutoff 200 ng/mL Benzodiazepine, urine              Cutoff 200 ng/mL Methadone, urine                   Cutoff 300 ng/mL The urine drug screen provides only a preliminary, unconfirmed analytical test result and should not be used for  non-medical purposes. Clinical consideration and professional judgment should be applied to any positive drug screen result due to possible interfering substances. A more specific alternate chemical method must be used in order to obtain a confirmed analytical result. Gas chromatography / mass spectrometry (GC/MS) is the preferred confirmat ory method. Performed at [facility], [address] [place], [place] [address]     No current facility-administered medications for this encounter.   Current Outpatient Medications  Medication Sig Dispense Refill  . albuterol (VENTOLIN HFA) 108 (90 Base) MCG/ACT inhaler Inhale 2 puffs into the lungs every 4 (four) hours as needed for wheezing or shortness of breath. 6.7 g 0  . benzonatate (TESSALON) 100 MG capsule Take 1 capsule (100 mg total) by mouth 3 (three) times daily as needed for cough. (Patient not taking: Reported on 04/18/2019) 12 capsule 0  . buPROPion HCl ER, XL, 450 MG TB24 Take 450 mg by mouth daily. For depression 30 tablet 0  . fluticasone (FLONASE) 50 MCG/ACT nasal spray Place 2 sprays into both nostrils daily. (Patient not taking: Reported on 06/12/2019) 11.1 mL 0  . gabapentin (NEURONTIN) 300 MG capsule Take 1 capsule (300 mg total) by mouth 3 (three) times daily. For agitation (Patient not taking: Reported on 04/18/2019) 90 capsule 0  . hydrOXYzine (ATARAX/VISTARIL) 25 MG tablet Take 1 tablet (25 mg total) by mouth 3 (three) times daily as needed for anxiety. 60 tablet 0  . paliperidone (INVEGA) 6 MG 24 hr tablet Take 1 tablet (6 mg total) by mouth daily. For mood control 30 tablet 0  . pantoprazole (PROTONIX) 40 MG tablet Take 1 tablet (40 mg total) by mouth daily. For acid reflux (Patient not taking: Reported on 04/18/2019) 15 tablet 0  . predniSONE (DELTASONE) 20 MG tablet 2 tabs po daily x 4 days (Patient not taking: Reported on 06/22/2019) 8 tablet 0  . traZODone (DESYREL) 50 MG tablet Take 1 tablet (50 mg total) by  mouth at bedtime as needed for sleep. 30 tablet 0    Musculoskeletal: Strength & Muscle Tone: within normal limits Gait & Station: normal Patient leans: N/A  Psychiatric Specialty Exam: Physical Exam  Nursing note and vitals reviewed. Constitutional: He is oriented to person, place, and time. He appears well-developed and well-nourished.  Respiratory: Effort normal.  Musculoskeletal:        General: Normal range of motion.     Cervical back: Normal range of motion and neck supple.  Neurological: He is alert and oriented to person, place, and time.  Psychiatric: His behavior is normal.    Review of Systems  Psychiatric/Behavioral: Positive for suicidal ideas. The patient is nervous/anxious.   All other systems reviewed and are negative.   Blood pressure 140/84, pulse (!) 104, temperature 97.6 F (36.4 C), temperature source Oral, resp. rate 16, height 6' (1.829 m), weight 90.3 kg, SpO2 98 %.Body mass index is 26.99 kg/m.  General Appearance: Casual  Eye Contact:  Fair  Speech:  Clear and Coherent  Volume:  Normal  Mood:  Anxious, Depressed and Hopeless  Affect:  Appropriate, Congruent and Depressed  Thought Process:  Coherent  Orientation:  Full (Time, Place, and Person)  Thought Content:  WDL and Logical  Suicidal Thoughts:  Yes.  without intent/plan  Homicidal Thoughts:  No  Memory:  Immediate;   Good Recent;   Good Remote;   Good  Judgement:  Impaired  Insight:  Lacking  Psychomotor Activity:  Normal  Concentration:  Concentration: Good and Attention Span: Good  Recall:  Good  Fund of Knowledge:  Good  Language:  Good  Akathisia:  Negative  Handed:  Right  AIMS (if indicated):     Assets:  Communication Skills Desire for Improvement Financial Resources/Insurance Housing Social Support Transportation  ADL's:  Intact  Cognition:  WNL  Sleep:    Okay     Treatment Plan Summary: Daily contact with patient to assess and evaluate symptoms and progress in  treatment and Plan The patient will remain under observation overnight and reassess in the a.m. to determine if he meets criteria for psychiatric inpatient admission or can be discharged to a shelter.  Disposition: Supportive therapy provided about ongoing stressors.  Gillermo Murdoch, NP 06/23/2019 3:53 AM

## 2019-06-23 NOTE — ED Notes (Signed)
Hourly rounding reveals patient in room. No complaints, stable, in no acute distress. Q15 minute rounds and monitoring via Rover and Officer to continue.   

## 2019-06-23 NOTE — Social Work (Signed)
TOC SW: Patient has been accepted to [facility] in [place].  Transportation provided via [facility] and transportation ([employer]) will contact ED Secretary to let them know when they have arrived. ETA 3:15.  ED nurse has been notified.

## 2019-06-23 NOTE — Social Work (Signed)
TOC SW: Contacted Mr. Ronald Martinez at [facility] in [place], there is bed availability.  Patient need to contact [facility] and "fill" out application on the phone.  Ed Nurse will give patient phone to contact [facility].  Will inform SW if transportation needs to be set up.

## 2019-06-23 NOTE — ED Provider Notes (Signed)
-----------------------------------------   5:41 AM on 06/23/2019 -----------------------------------------   Blood pressure 140/84, pulse (!) 104, temperature 97.6 F (36.4 C), temperature source Oral, resp. rate 16, height 6' (1.829 m), weight 90.3 kg, SpO2 98 %.  The patient is calm and cooperative at this time.  There have been no acute events since the last update.  Awaiting disposition plan from Behavioral Medicine and/or Social Work team(s).   Irean Hong, MD 06/23/19 [id number]

## 2019-06-23 NOTE — ED Notes (Signed)
VOL/Consult completed/ Pending re-assessment for Inpt vs discharge

## 2019-06-23 NOTE — Care Management (Signed)
TOC RN CM: Patient stated he was scheduled to admit to [facility] yesterday at discharge from [facility] ED however missed the time for admission. TOC outreached to [employer] [facility] who advised they had been full for weeks and had no availability. TOC will continue to search for housing.

## 2019-06-25 ENCOUNTER — Ambulatory Visit (HOSPITAL_COMMUNITY)
Admission: RE | Admit: 2019-06-25 | Discharge: 2019-06-25 | Disposition: A | Discharge status: Home / Self Care | Payer: Medicaid Other | Attending: Psychiatry | Admitting: Psychiatry

## 2019-06-25 DIAGNOSIS — R45850 Homicidal ideations: Secondary | ICD-10-CM | POA: Diagnosis not present

## 2019-06-25 DIAGNOSIS — F419 Anxiety disorder, unspecified: Secondary | ICD-10-CM | POA: Diagnosis not present

## 2019-06-25 DIAGNOSIS — F329 Major depressive disorder, single episode, unspecified: Secondary | ICD-10-CM | POA: Insufficient documentation

## 2019-06-25 DIAGNOSIS — F341 Dysthymic disorder: Secondary | ICD-10-CM | POA: Insufficient documentation
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
## 2019-06-25 NOTE — Progress Notes (Signed)
Pt provided consent for TTS to speak with his brother, [place], [phone]. TTS called, no answer. Unable to leave a message due to the person's mailbox being full.   Princess Bruins, MSW, LCSW Therapeutic Triage Specialist  [phone]

## 2019-06-25 NOTE — Progress Notes (Signed)
Nira Conn, NP reports pt does not meet criteria for inpt tx. Pt has been given homeless shelter resources and MH resources. Pt has been given the opportunity to ask questions prior to d/c.    Princess Bruins, MSW, LCSW Therapeutic Triage Specialist  [phone]

## 2019-06-26 ENCOUNTER — Other Ambulatory Visit: Payer: Self-pay

## 2019-06-26 ENCOUNTER — Emergency Department (HOSPITAL_COMMUNITY)
Admission: EM | Admit: 2019-06-26 | Discharge: 2019-06-26 | Disposition: A | Discharge status: Home / Self Care | Payer: Medicaid Other | Attending: Emergency Medicine | Admitting: Emergency Medicine

## 2019-06-26 DIAGNOSIS — F329 Major depressive disorder, single episode, unspecified: Secondary | ICD-10-CM | POA: Diagnosis present

## 2019-06-26 DIAGNOSIS — Z046 Encounter for general psychiatric examination, requested by authority: Secondary | ICD-10-CM | POA: Insufficient documentation

## 2019-06-26 DIAGNOSIS — R45850 Homicidal ideations: Secondary | ICD-10-CM | POA: Insufficient documentation

## 2019-06-26 DIAGNOSIS — J449 Chronic obstructive pulmonary disease, unspecified: Secondary | ICD-10-CM | POA: Diagnosis not present

## 2019-06-26 DIAGNOSIS — F3131 Bipolar disorder, current episode depressed, mild: Secondary | ICD-10-CM | POA: Diagnosis not present

## 2019-06-26 DIAGNOSIS — Z7689 Persons encountering health services in other specified circumstances: Secondary | ICD-10-CM

## 2019-06-26 DIAGNOSIS — F17210 Nicotine dependence, cigarettes, uncomplicated: Secondary | ICD-10-CM | POA: Insufficient documentation

## 2019-06-26 LAB — COMPREHENSIVE METABOLIC PANEL
ALT: 24 U/L (ref 0–44)
AST: 22 U/L (ref 15–41)
Albumin: 4.5 g/dL (ref 3.5–5.0)
Alkaline Phosphatase: 75 U/L (ref 38–126)
Anion gap: 10 (ref 5–15)
BUN: 17 mg/dL (ref 6–20)
CO2: 25 mmol/L (ref 22–32)
Calcium: 9.7 mg/dL (ref 8.9–10.3)
Chloride: 105 mmol/L (ref 98–111)
Creatinine, Ser: 0.62 mg/dL (ref 0.61–1.24)
GFR calc Af Amer: >60 mL/min (ref >60)
GFR calc non Af Amer: >60 mL/min (ref >60)
Glucose, Bld: 98 mg/dL (ref 70–99)
Potassium: 3.3 mmol/L — ABNORMAL LOW (ref 3.5–5.1)
Sodium: 140 mmol/L (ref 135–145)
Total Bilirubin: 0.7 mg/dL (ref 0.3–1.2)
Total Protein: 7.7 g/dL (ref 6.5–8.1)

## 2019-06-26 LAB — ETHANOL: Alcohol, Ethyl (B): <10 mg/dL (ref <10)

## 2019-06-26 LAB — CBC
HCT: 40.3 % (ref 39.0–52.0)
Hemoglobin: 13.3 g/dL (ref 13.0–17.0)
MCH: 28.1 pg (ref 26.0–34.0)
MCHC: 33 g/dL (ref 30.0–36.0)
MCV: 85 fL (ref 80.0–100.0)
Platelets: 291 10*3/uL (ref 150–400)
RBC: 4.74 MIL/uL (ref 4.22–5.81)
RDW: 14.8 % (ref 11.5–15.5)
WBC: 8.3 10*3/uL (ref 4.0–10.5)
nRBC: 0 % (ref 0.0–0.2)

## 2019-06-26 LAB — ACETAMINOPHEN LEVEL: Acetaminophen (Tylenol), Serum: <10 ug/mL — ABNORMAL LOW (ref 10–30)

## 2019-06-26 LAB — SALICYLATE LEVEL: Salicylate Lvl: <7 mg/dL — ABNORMAL LOW (ref 7.0–30.0)

## 2019-06-26 NOTE — ED Notes (Signed)
TTS consult in progress. °

## 2019-06-26 NOTE — ED Triage Notes (Signed)
Pt presents to ED today for "mental health" complaints stating he doesn't feel like he's being taken seriously for his issues. Admits to hearing voices, denies them telling him to do things but states they tell him negative things that make him feel more depressed. Denies SI today but endorses HI. Has been see for same issues recently as well.

## 2019-06-26 NOTE — ED Notes (Signed)
Pt's belongings are placed in one white bag and one red bag and placed in the cabinet labeled, "patient belongings 16-18 Resus A."
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
## 2019-06-26 NOTE — Discharge Instructions (Addendum)
You were evaluated in the Emergency Department and after careful evaluation, we did not find any emergent condition requiring admission or further testing in the hospital.  Your exam/testing today is overall reassuring.  Please return to the Emergency Department if you experience any worsening of your condition.  We encourage you to follow up with a primary care provider.  Thank you for allowing us to be a part of your care. 
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
## 2019-06-27 ENCOUNTER — Other Ambulatory Visit: Payer: Self-pay

## 2019-06-27 ENCOUNTER — Encounter (HOSPITAL_COMMUNITY): Payer: Self-pay | Admitting: Emergency Medicine

## 2019-06-27 ENCOUNTER — Emergency Department (HOSPITAL_COMMUNITY)
Admission: EM | Admit: 2019-06-27 | Discharge: 2019-06-27 | Disposition: A | Discharge status: Home / Self Care | Payer: Medicaid Other | Attending: Emergency Medicine | Admitting: Emergency Medicine

## 2019-06-27 DIAGNOSIS — J45909 Unspecified asthma, uncomplicated: Secondary | ICD-10-CM | POA: Diagnosis not present

## 2019-06-27 DIAGNOSIS — R45851 Suicidal ideations: Secondary | ICD-10-CM | POA: Insufficient documentation

## 2019-06-27 DIAGNOSIS — F200 Paranoid schizophrenia: Secondary | ICD-10-CM | POA: Diagnosis not present

## 2019-06-27 DIAGNOSIS — Z590 Homelessness: Secondary | ICD-10-CM | POA: Insufficient documentation

## 2019-06-27 DIAGNOSIS — F17210 Nicotine dependence, cigarettes, uncomplicated: Secondary | ICD-10-CM | POA: Diagnosis not present

## 2019-06-27 DIAGNOSIS — F22 Delusional disorders: Secondary | ICD-10-CM | POA: Diagnosis present

## 2019-06-27 LAB — CBC

## 2019-06-27 NOTE — ED Triage Notes (Signed)
Pt reports having suicidal ideation with plan of jumping off of a bridge. Pt has been seen multiple times this week for homelessness and paranoia.

## 2019-06-27 NOTE — ED Provider Notes (Signed)
TIME SEEN: 3:30 AM  CHIEF COMPLAINT: Suicidal ideation  HPI: Patient is a 39 year old male with history of schizophrenia who presents to the emergency department for suicidal thoughts.  States he was brought here by [employer]  Has no plan.  No HI or hallucinations.  No complaints.  Patient is homeless.  ROS: See HPI Constitutional: no fever  Eyes: no drainage  ENT: no runny nose   Cardiovascular:  no chest pain  Resp: no SOB  GI: no vomiting GU: no dysuria Integumentary: no rash  Allergy: no hives  Musculoskeletal: no leg swelling  Neurological: no slurred speech ROS otherwise negative  PAST MEDICAL HISTORY/PAST SURGICAL HISTORY:  Past Medical History:  Diagnosis Date  . Asthma   . Bipolar 1 disorder ([place])   . COPD (chronic obstructive pulmonary disease) ([place])   . Depression   . Schizophrenia ([place])     MEDICATIONS:  Prior to Admission medications   Medication Sig Start Date End Date Taking? Authorizing Provider  albuterol (VENTOLIN HFA) 108 (90 Base) MCG/ACT inhaler Inhale 2 puffs into the lungs every 4 (four) hours as needed for wheezing or shortness of breath. 04/19/19   Sherwood Gambler, MD  buPROPion HCl ER, XL, 450 MG TB24 Take 450 mg by mouth daily. For depression 06/22/19   Carmin Muskrat, MD  hydrOXYzine (ATARAX/VISTARIL) 25 MG tablet Take 1 tablet (25 mg total) by mouth 3 (three) times daily as needed for anxiety. 06/22/19   Carmin Muskrat, MD  paliperidone (INVEGA) 6 MG 24 hr tablet Take 1 tablet (6 mg total) by mouth daily. For mood control 06/22/19   Carmin Muskrat, MD  traZODone (DESYREL) 50 MG tablet Take 1 tablet (50 mg total) by mouth at bedtime as needed for sleep. 06/22/19   Carmin Muskrat, MD    ALLERGIES:  Allergies  Allergen Reactions  . Pearlean Brownie Meat] Nausea And Vomiting    Allergic to bologna and salami    SOCIAL HISTORY:  Social History   Tobacco Use  . Smoking status: Current Some Day Smoker    Packs/day: 1.00     Types: Cigarettes  . Smokeless tobacco: Never Used  Substance Use Topics  . Alcohol use: Yes    Comment: only drinks on occasion    FAMILY HISTORY: Family History  Problem Relation Age of Onset  . Asthma Mother   . Alcoholism Other     EXAM: BP (!) 129/57 (BP Location: Left Arm)   Pulse 68   Temp 98.9 F (37.2 C) (Oral)   Resp 18   Ht 5\' 11"  (1.803 m)   Wt 90 kg   SpO2 100%   BMI 27.67 kg/m  CONSTITUTIONAL: Alert and oriented and responds appropriately to questions. Well-appearing; well-nourished HEAD: Normocephalic EYES: Conjunctivae clear, pupils appear equal, EOM appear intact ENT: normal nose; moist mucous membranes NECK: Supple, normal ROM CARD: RRR; S1 and S2 appreciated; no murmurs, no clicks, no rubs, no gallops RESP: Normal chest excursion without splinting or tachypnea; breath sounds clear and equal bilaterally; no wheezes, no rhonchi, no rales, no hypoxia or respiratory distress, speaking full sentences ABD/GI: Normal bowel sounds; non-distended; soft, non-tender, no rebound, no guarding, no peritoneal signs, no hepatosplenomegaly BACK:  The back appears normal EXT: Normal ROM in all joints; no deformity noted, no edema; no cyanosis SKIN: Normal color for age and race; warm; no rash on exposed skin NEURO: Moves all extremities equally PSYCH: The patient's mood and manner are appropriate.   MEDICAL DECISION MAKING: Patient here reporting  suicidal ideation.  He has no plan.  I suspect patient may be malingering.  He has been here now 3 times in the past 72 hours for the same and has been seen by psychiatry and cleared from a psychiatric standpoint and given outpatient resources.  He is homeless.  I do not feel he needs a third psychiatric evaluation.  I do not feel he is a risk to himself or others.  He does not appear to be responding to internal stimuli.  He is resting comfortably and is calm, cooperative.  He is not under IVC.  Labs less than 24 hours ago were  unremarkable.  I do not feel the need to be repeated today.  Patient will be discharged from the emergency department with outpatient resources.  At this time, I do not feel there is any life-threatening condition present. I have reviewed, interpreted and discussed all results (EKG, imaging, lab, urine as appropriate) and exam findings with patient/family. I have reviewed nursing notes and appropriate previous records.  I feel the patient is safe to be discharged home without further emergent workup and can continue workup as an outpatient as needed. Discussed usual and customary return precautions. Patient/family verbalize understanding and are comfortable with this plan.  Outpatient follow-up has been provided as needed. All questions have been answered.   Ronald Martinez was evaluated in Emergency Department on 06/27/2019 for the symptoms described in the history of present illness. He was evaluated in the context of the global COVID-19 pandemic, which necessitated consideration that the patient might be at risk for infection with the SARS-CoV-2 virus that causes COVID-19. Institutional protocols and algorithms that pertain to the evaluation of patients at risk for COVID-19 are in a state of rapid change based on information released by regulatory bodies including the CDC and federal and state organizations. These policies and algorithms were followed during the patient's care in the ED.  Patient was seen wearing N95, face shield, gloves.    Lachandra Dettmann, Layla Maw, DO 06/27/19 0400

## 2019-06-30 ENCOUNTER — Encounter: Payer: Self-pay | Admitting: Critical Care Medicine

## 2019-06-30 ENCOUNTER — Other Ambulatory Visit: Payer: Self-pay | Admitting: Critical Care Medicine

## 2019-06-30 MED ORDER — HALOPERIDOL 5 MG PO TABS: 5.0000 mg | ORAL_TABLET | Freq: Every day | ORAL | 0 refills | Status: AC

## 2019-06-30 NOTE — Congregational Nurse Program (Signed)
Mr. Ronald Martinez seen by Dr.Wright for medication refill until client can be seen at [facility] for services. Medication will be picked up by CN at [facility]
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
## 2020-12-05 IMAGING — DX DG CHEST 1V PORT
1 series · 1 of 1 positions shown · non-contrast
Comparison: Single-view of the chest 06/12/2019. PA and lateral
chest 08/14/2017.

CLINICAL DATA: Altered mental status.  Psychiatric evaluation.

EXAM:
PORTABLE CHEST 1 VIEW

[chest ap]
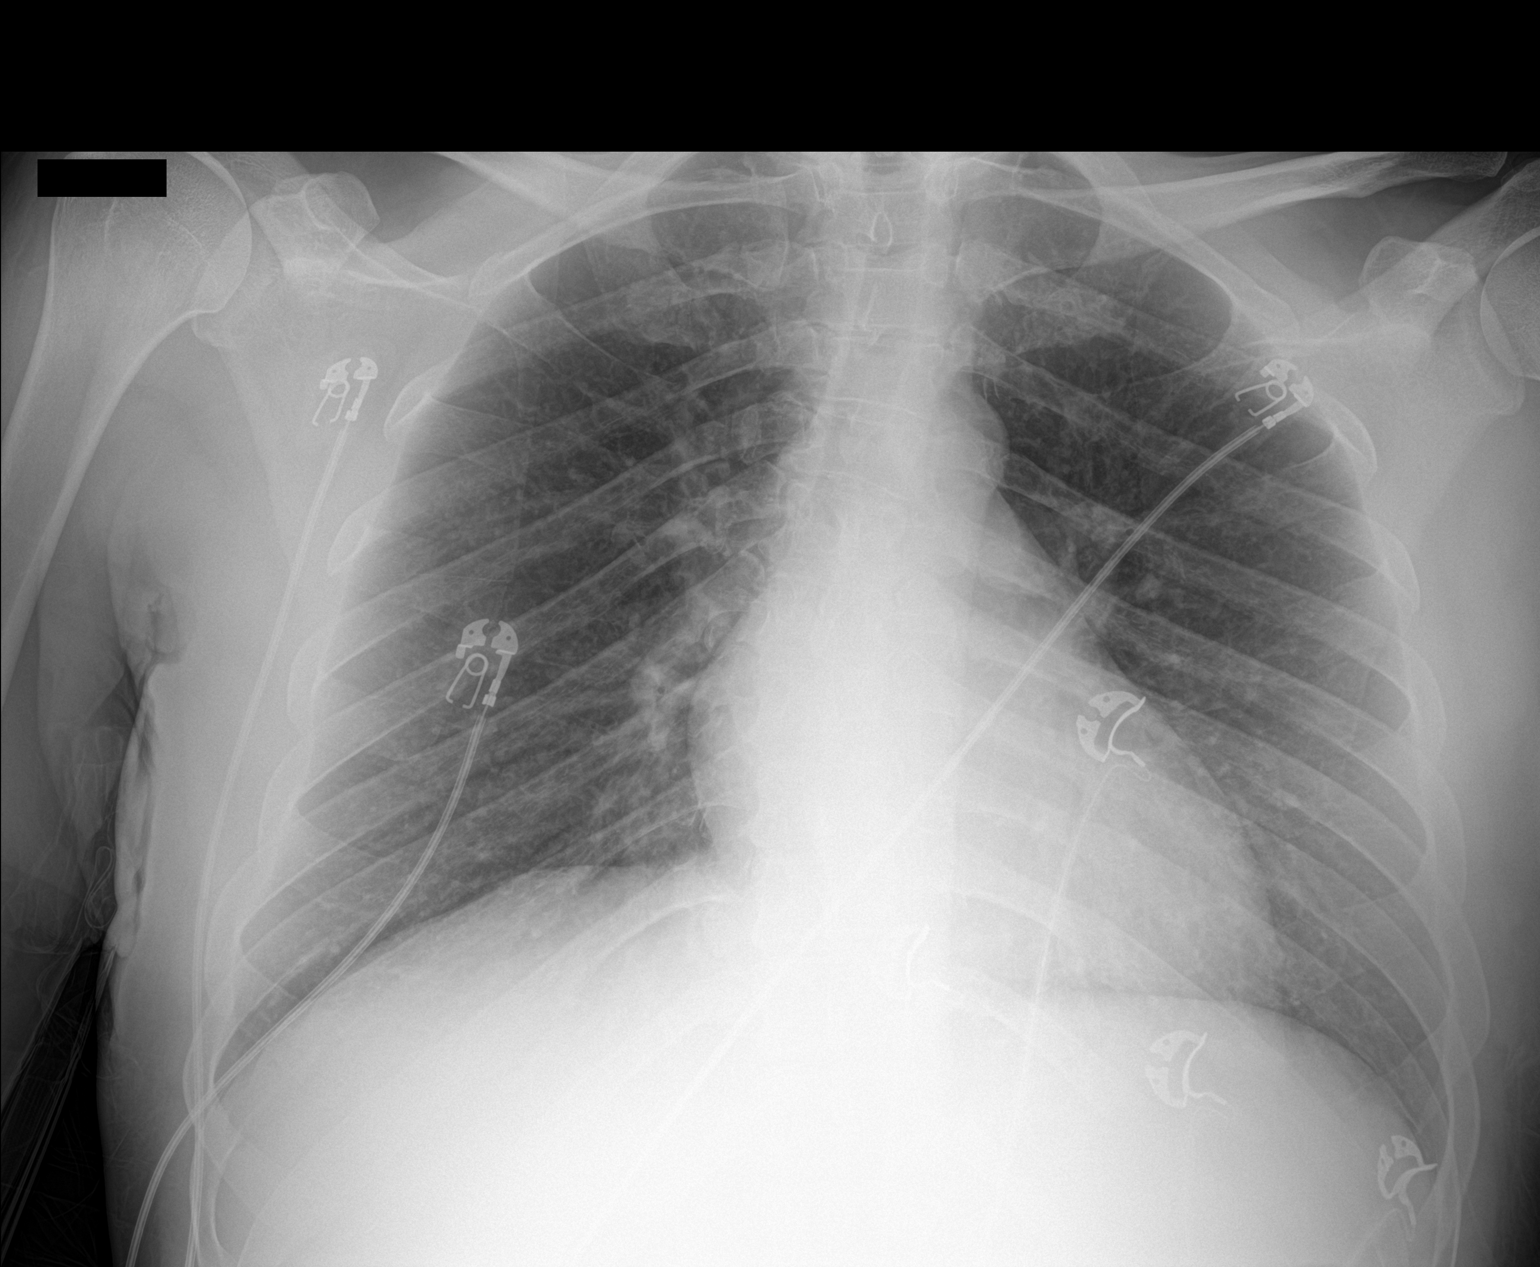

[1 of 1 positions shown; findings below may reference images not displayed]

FINDINGS: The lungs are clear. Heart size is normal. No pneumothorax or
pleural effusion. No acute or focal bony abnormality.
IMPRESSION: Negative chest.
